# Patient Record
Sex: Male | Born: 1960 | Race: Black or African American | Hispanic: No | Marital: Married | Smoking: Current every day smoker
Health system: Southern US, Community
[De-identification: ages and names within clinical notes are randomized; demographics above are authoritative.]

## PROBLEM LIST (undated history)

## (undated) DIAGNOSIS — C61 Malignant neoplasm of prostate: Secondary | ICD-10-CM

## (undated) DIAGNOSIS — I10 Essential (primary) hypertension: Secondary | ICD-10-CM

## (undated) DIAGNOSIS — M549 Dorsalgia, unspecified: Secondary | ICD-10-CM

## (undated) HISTORY — DX: Malignant neoplasm of prostate: C61

---

## 2019-12-05 ENCOUNTER — Other Ambulatory Visit: Payer: Self-pay

## 2019-12-05 ENCOUNTER — Ambulatory Visit: Payer: Medicaid Other | Admitting: Family Medicine

## 2019-12-05 ENCOUNTER — Other Ambulatory Visit (HOSPITAL_COMMUNITY)
Admission: RE | Admit: 2019-12-05 | Discharge: 2019-12-05 | Disposition: A | Discharge status: Home / Self Care | Payer: Medicaid Other | Source: Ambulatory Visit | Attending: Family Medicine | Admitting: Family Medicine

## 2019-12-05 VITALS — BP 126/78 | HR 102 | Ht 66.93 in | Wt 138.6 lb

## 2019-12-05 DIAGNOSIS — Z111 Encounter for screening for respiratory tuberculosis: Secondary | ICD-10-CM | POA: Insufficient documentation

## 2019-12-05 DIAGNOSIS — Z0289 Encounter for other administrative examinations: Secondary | ICD-10-CM | POA: Diagnosis present

## 2019-12-05 DIAGNOSIS — Z720 Tobacco use: Secondary | ICD-10-CM

## 2019-12-05 DIAGNOSIS — H6120 Impacted cerumen, unspecified ear: Secondary | ICD-10-CM | POA: Insufficient documentation

## 2019-12-05 DIAGNOSIS — H6122 Impacted cerumen, left ear: Secondary | ICD-10-CM | POA: Diagnosis not present

## 2019-12-05 DIAGNOSIS — H6121 Impacted cerumen, right ear: Secondary | ICD-10-CM | POA: Insufficient documentation

## 2019-12-05 LAB — POCT URINALYSIS DIP (MANUAL ENTRY)
Bilirubin, UA: NEGATIVE
Glucose, UA: NEGATIVE mg/dL
Ketones, POC UA: NEGATIVE mg/dL
Leukocytes, UA: NEGATIVE
Nitrite, UA: NEGATIVE
Protein Ur, POC: NEGATIVE mg/dL
Spec Grav, UA: 1.015 (ref 1.010–1.025)
Urobilinogen, UA: 0.2 E.U./dL
pH, UA: 5.5 (ref 5.0–8.0)

## 2019-12-05 LAB — POCT UA - MICROSCOPIC ONLY

## 2019-12-05 NOTE — Patient Instructions (Signed)
Thank you for coming to see me today. It was a pleasure.   You will need to go for an chest xray.  We will call you case manager and schedule this for you.  Please follow-up with me on Oct 22 at 1125 am  We will discuss your labs at your next visit.    If you have any questions or concerns, please do not hesitate to call the office at [phone].  Best,   Dana Allan, MD Family Medicine Residency

## 2019-12-05 NOTE — Assessment & Plan Note (Signed)
Removal of impacted cerumen.  Patient aware that my have some irritation. Follow up in 4 weeks

## 2019-12-05 NOTE — Assessment & Plan Note (Signed)
Overseas Refugee TB screening positive.  Labs for quant ordered.  Will need chest xray.  Ordered and map provided. Will need to call case manager to arrange escort.

## 2019-12-05 NOTE — Addendum Note (Signed)
Addended by: Manson Passey, Demetre Monaco on: 12/05/2019 06:56 PM   Modules accepted: SmartSet

## 2019-12-05 NOTE — Progress Notes (Addendum)
Patient Name: Arthur Ali Date of Birth: 1961-01-02 Date of Visit: 12/05/19 PCP: No primary care provider on file.  Chief Complaint: refugee intake examination and right arm numbbness  The patient's preferred language is Swahili. An interpreter was used for the entire visit.  Interpreter Name or ID: Jocelyn    Subjective: Arthur Ali is a pleasant 59 y.o. presenting today for an initial refugee and immigrant clinic visit.    Right arm numbness Patient reports intermittent right arm numbness that starts at the shoulder and extends to tips of fingers.  He also reports burning sensation in fingers of same hand.  Has been ongoing since he was in [place].  Episodes last 1-2 secs and only happens during the day.  No aggravating or relieving factors.  Denies any headaches, slurred speech, facial drooping or weakness.  Reports no fevers or recent trauma.  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  ROS: per HPI  PMH: Unremarkable  PSH: None  FH: None  Current Medications:  None  Refugee Information Number of Immediate Family Members: 0 Number of Immediate Family Members in [place]: 0 Country of Birth: [facility] Country of Origin: [facility] Location of Refugee Camp: [place] Duration in [place]: 20 years or greater Reason for Leaving Home Country: Political opinion Primary Language: Swahili/Kiswahili, Kinyarwanda/Rwanda Able to Read in Primary Language: Yes Able to Write in Primary Language: Yes Education: Primary School Prior Work: No previous jobs Marital Status: Other (Wife and children decreased) Sexual Activity: No Tuberculosis Screening Overseas: Positive Tuberculosis Screening Health Department: Not Completed Health Department Labs Completed: Yes History of Trauma: None Do You Feel Jumpy or Nervous?: No Are You Very Watchful or 'Super Alert'?: No   Date of Overseas Exam: 11/11/19 Review of Overseas Exam: 12/05/19 Pre-Departure Treatment: None Overseas Vaccines  Reviewed and Updated in Epic: see immunizations tab   Vitals:   12/05/19 1433  BP: 126/78  Pulse: (!) 102  SpO2: 96%   HEENT: Sclera anicteric. Dentition is poor . Appears well hydrated. Left TM not visible, impacted dcerumen Neck: Supple Cardiac: Regular rate and rhythm. Normal S1/S2. No murmurs, rubs, or gallops appreciated.  Lungs: Clear bilaterally to ascultation.  Abdomen: Normoactive bowel sounds. No tenderness to deep or light palpation. No rebound or guarding. Splenomegaly not appreciated.  Extremities: Warm, well perfused without edema.  Skin: No rashes present  Psych: Pleasant and appropriate, No SI/HI MSK: ROM wnl, 5/5 strength in upper and lower extremities.  Tinels and Phalens negative. Neuro: CN II-XIII intact, motor, sensory and gait wnl  TB positive screening Overseas Refugee TB screening positive.  Labs for quant ordered.  Will need chest xray.  Ordered and map provided. Will need to call case manager to arrange escort.  Cerumen impaction of left ear No concerns with hearing.  Removal of impacted cerumen.  Patient aware that my have some irritation. Follow up in 4 weeks  Paresthesia Right arm Intermittent episodes lasting 1-2 seconds.  Considered TIA/CVA but less likely given no focal deficits on neuro exam and has been ongoing since living in [place]. Has no increasing risk factors.  Low suspicion for carpal tunnel given negative exam.  Likely paresthesia to cervical degenerative secondary to aging.  Will continue to monitor and if worsens can consider MRI.  Designated Market researcher signed with agency: yes.   Release of information signed for Health Department : yes.   Return to care in 1 month in [facility] with resident physician and PCP: Scheduled for Oct 22.   Vaccines: None  today   Follow up in 4 weeks with PCP.  I discussed the plan of care with the resident physician and agree with below documentation.  Terisa Starr, MD

## 2019-12-07 ENCOUNTER — Telehealth: Payer: Self-pay | Admitting: Family Medicine

## 2019-12-07 LAB — URINE CYTOLOGY ANCILLARY ONLY
Chlamydia: NEGATIVE
Comment: NEGATIVE
Comment: NORMAL
Neisseria Gonorrhea: NEGATIVE

## 2019-12-07 NOTE — Telephone Encounter (Addendum)
The patient speaks Swahili as their primary language.  An interpreter was used for the entire visit.   Left generic voicemail.  Labs notable for leukocytosis (neutrophilia and mild eosinophilia), thrombocytopenia, elevated total protein.   Patient with positive RPR, negative T. Pallidum--likely false positive related to vaccine.   Will follow up ASAP (Monday during precepting or Tuesday AM).  Will try patient later today. If not, will contact CM to schedule for visit.   Addendum: attempted to call patient on 1730 on 10/1, again went to voicemail---will reach out to CM.   Terisa Starr, MD  Family Medicine Teaching Service

## 2019-12-08 ENCOUNTER — Other Ambulatory Visit: Payer: Self-pay | Admitting: Obstetrics and Gynecology

## 2019-12-08 ENCOUNTER — Ambulatory Visit
Admission: RE | Admit: 2019-12-08 | Discharge: 2019-12-08 | Disposition: A | Discharge status: Home / Self Care | Payer: No Typology Code available for payment source | Source: Ambulatory Visit | Attending: Obstetrics and Gynecology | Admitting: Obstetrics and Gynecology

## 2019-12-08 DIAGNOSIS — R9389 Abnormal findings on diagnostic imaging of other specified body structures: Secondary | ICD-10-CM

## 2019-12-09 LAB — COMPREHENSIVE METABOLIC PANEL
ALT: 19 IU/L (ref 0–44)
AST: 23 IU/L (ref 0–40)
Albumin/Globulin Ratio: 0.5 — ABNORMAL LOW (ref 1.2–2.2)
Albumin: 3.5 g/dL — ABNORMAL LOW (ref 3.8–4.8)
Alkaline Phosphatase: 53 IU/L (ref 44–121)
BUN/Creatinine Ratio: 14 (ref 10–24)
BUN: 11 mg/dL (ref 8–27)
Bilirubin Total: 0.5 mg/dL (ref 0.0–1.2)
CO2: 27 mmol/L (ref 20–29)
Calcium: 9.2 mg/dL (ref 8.6–10.2)
Chloride: 98 mmol/L (ref 96–106)
Creatinine, Ser: 0.81 mg/dL (ref 0.76–1.27)
GFR calc Af Amer: 111 mL/min/{1.73_m2} (ref >59)
GFR calc non Af Amer: 96 mL/min/{1.73_m2} (ref >59)
Globulin, Total: 6.5 g/dL — ABNORMAL HIGH (ref 1.5–4.5)
Glucose: 100 mg/dL — ABNORMAL HIGH (ref 65–99)
Potassium: 4.7 mmol/L (ref 3.5–5.2)
Sodium: 133 mmol/L — ABNORMAL LOW (ref 134–144)
Total Protein: 10 g/dL — ABNORMAL HIGH (ref 6.0–8.5)

## 2019-12-09 LAB — CBC WITH DIFFERENTIAL/PLATELET
Basophils Absolute: 0 10*3/uL (ref 0.0–0.2)
Basos: 0 %
EOS (ABSOLUTE): 0.5 10*3/uL — ABNORMAL HIGH (ref 0.0–0.4)
Eos: 4 %
Hematocrit: 41 % (ref 37.5–51.0)
Hemoglobin: 13.8 g/dL (ref 13.0–17.7)
Immature Grans (Abs): 0 10*3/uL (ref 0.0–0.1)
Immature Granulocytes: 0 %
Lymphocytes Absolute: 9.2 10*3/uL — ABNORMAL HIGH (ref 0.7–3.1)
Lymphs: 68 %
MCH: 29.7 pg (ref 26.6–33.0)
MCHC: 33.7 g/dL (ref 31.5–35.7)
MCV: 88 fL (ref 79–97)
Monocytes Absolute: 0.7 10*3/uL (ref 0.1–0.9)
Monocytes: 5 %
Neutrophils Absolute: 3.2 10*3/uL (ref 1.4–7.0)
Neutrophils: 23 %
Platelets: 58 10*3/uL — CL (ref 150–450)
RBC: 4.64 x10E6/uL (ref 4.14–5.80)
RDW: 13.5 % (ref 11.6–15.4)
WBC: 13.7 10*3/uL — ABNORMAL HIGH (ref 3.4–10.8)

## 2019-12-09 LAB — RPR: RPR Ser Ql: REACTIVE — AB

## 2019-12-09 LAB — LIPID PANEL
Chol/HDL Ratio: 2.8 ratio (ref 0.0–5.0)
Cholesterol, Total: 118 mg/dL (ref 100–199)
HDL: 42 mg/dL (ref >39)
LDL Chol Calc (NIH): 63 mg/dL (ref 0–99)
Triglycerides: 60 mg/dL (ref 0–149)
VLDL Cholesterol Cal: 13 mg/dL (ref 5–40)

## 2019-12-09 LAB — QUANTIFERON-TB GOLD PLUS
QuantiFERON Mitogen Value: >10 IU/mL
QuantiFERON Nil Value: 0.55 IU/mL
QuantiFERON TB1 Ag Value: 0.46 IU/mL
QuantiFERON TB2 Ag Value: 0.45 IU/mL
QuantiFERON-TB Gold Plus: NEGATIVE

## 2019-12-09 LAB — HIV ANTIBODY (ROUTINE TESTING W REFLEX): HIV Screen 4th Generation wRfx: NONREACTIVE

## 2019-12-09 LAB — VARICELLA ZOSTER ANTIBODY, IGG: Varicella zoster IgG: 416 index (ref >165)

## 2019-12-09 LAB — RPR, QUANT+TP ABS (REFLEX)
Rapid Plasma Reagin, Quant: 1:4 {titer} — ABNORMAL HIGH
T Pallidum Abs: NONREACTIVE

## 2019-12-09 LAB — TSH: TSH: 2.59 u[IU]/mL (ref 0.450–4.500)

## 2019-12-09 LAB — STRONGYLOIDES, AB, IGG: Strongyloides, Ab, IgG: NEGATIVE

## 2019-12-09 LAB — HCV AB W REFLEX TO QUANT PCR: HCV Ab: 0.2 s/co ratio (ref 0.0–0.9)

## 2019-12-09 LAB — HEPATITIS B CORE ANTIBODY, TOTAL: Hep B Core Total Ab: NEGATIVE

## 2019-12-09 LAB — HCV INTERPRETATION

## 2019-12-09 LAB — HEPATITIS B SURFACE ANTIGEN: Hepatitis B Surface Ag: NEGATIVE

## 2019-12-09 LAB — HEPATITIS B SURFACE ANTIBODY, QUANTITATIVE: Hepatitis B Surf Ab Quant: 61.9 m[IU]/mL (ref >9.9)

## 2019-12-11 ENCOUNTER — Telehealth: Payer: Self-pay | Admitting: Family Medicine

## 2019-12-11 NOTE — Telephone Encounter (Signed)
Attempted to call patient regarding elevated serum protein, low platelets. Sent secure message to CM to help schedule patient for lab visit ASAP.   Terisa Starr, MD  Family Medicine Teaching Service

## 2019-12-12 ENCOUNTER — Telehealth: Payer: Self-pay | Admitting: Family Medicine

## 2019-12-12 ENCOUNTER — Other Ambulatory Visit: Payer: Self-pay | Admitting: Family Medicine

## 2019-12-12 DIAGNOSIS — D696 Thrombocytopenia, unspecified: Secondary | ICD-10-CM

## 2019-12-12 NOTE — Progress Notes (Signed)
Spoke with patient's CM with permission. Scheduled for lab visit in AM. Labs ordered to evaluate thrombocytopenia, elevated serum protein, concern for hypersplenism.   Arthur Starr, MD  Family Medicine Teaching Service

## 2019-12-12 NOTE — Telephone Encounter (Signed)
Attempted to call patient with Swahili interpreter. Again, unable to leave VM. Contacted emergency contact given degree of thrombocytopenia. Unable to contact patient. Will reach out to CM again.   Terisa Starr, MD  Family Medicine Teaching Service

## 2019-12-13 ENCOUNTER — Other Ambulatory Visit: Payer: No Typology Code available for payment source

## 2019-12-13 ENCOUNTER — Other Ambulatory Visit: Payer: Self-pay

## 2019-12-13 ENCOUNTER — Other Ambulatory Visit: Payer: Self-pay | Admitting: Family Medicine

## 2019-12-13 DIAGNOSIS — D696 Thrombocytopenia, unspecified: Secondary | ICD-10-CM

## 2019-12-14 LAB — PARASITE EXAM, BLOOD

## 2019-12-14 LAB — CBC WITH DIFFERENTIAL/PLATELET
Basophils Absolute: 0.1 10*3/uL (ref 0.0–0.2)
Basos: 1 %
EOS (ABSOLUTE): 0.5 10*3/uL — ABNORMAL HIGH (ref 0.0–0.4)
Eos: 4 %
Hematocrit: 42.6 % (ref 37.5–51.0)
Hemoglobin: 13.8 g/dL (ref 13.0–17.7)
Immature Grans (Abs): 0 10*3/uL (ref 0.0–0.1)
Immature Granulocytes: 0 %
Lymphocytes Absolute: 8.8 10*3/uL — ABNORMAL HIGH (ref 0.7–3.1)
Lymphs: 70 %
MCH: 29.4 pg (ref 26.6–33.0)
MCHC: 32.4 g/dL (ref 31.5–35.7)
MCV: 91 fL (ref 79–97)
Monocytes Absolute: 0.6 10*3/uL (ref 0.1–0.9)
Monocytes: 5 %
Neutrophils Absolute: 2.4 10*3/uL (ref 1.4–7.0)
Neutrophils: 20 %
RBC: 4.69 x10E6/uL (ref 4.14–5.80)
RDW: 13.2 % (ref 11.6–15.4)
WBC: 12.3 10*3/uL — ABNORMAL HIGH (ref 3.4–10.8)

## 2019-12-14 LAB — HEPATIC FUNCTION PANEL
ALT: 18 IU/L (ref 0–44)
AST: 23 IU/L (ref 0–40)
Albumin: 3.6 g/dL — ABNORMAL LOW (ref 3.8–4.8)
Alkaline Phosphatase: 55 IU/L (ref 44–121)
Bilirubin Total: 0.4 mg/dL (ref 0.0–1.2)
Bilirubin, Direct: 0.11 mg/dL (ref 0.00–0.40)
Total Protein: 9.6 g/dL — ABNORMAL HIGH (ref 6.0–8.5)

## 2019-12-14 LAB — VITAMIN B12: Vitamin B-12: 280 pg/mL (ref 232–1245)

## 2019-12-14 LAB — GLUCOSE 6 PHOSPHATE DEHYDROGENASE

## 2019-12-16 ENCOUNTER — Telehealth: Payer: Self-pay | Admitting: Family Medicine

## 2019-12-16 LAB — PATHOLOGIST SMEAR REVIEW
Basophils Absolute: 0.1 10*3/uL (ref 0.0–0.2)
Basos: 0 %
EOS (ABSOLUTE): 0.4 10*3/uL (ref 0.0–0.4)
Eos: 4 %
Hematocrit: 42.5 % (ref 37.5–51.0)
Hemoglobin: 13.9 g/dL (ref 13.0–17.7)
Immature Grans (Abs): 0 10*3/uL (ref 0.0–0.1)
Immature Granulocytes: 0 %
Lymphocytes Absolute: 8.3 10*3/uL — ABNORMAL HIGH (ref 0.7–3.1)
Lymphs: 70 %
MCH: 29.3 pg (ref 26.6–33.0)
MCHC: 32.7 g/dL (ref 31.5–35.7)
MCV: 90 fL (ref 79–97)
Monocytes Absolute: 0.6 10*3/uL (ref 0.1–0.9)
Monocytes: 5 %
Neutrophils Absolute: 2.4 10*3/uL (ref 1.4–7.0)
Neutrophils: 21 %
RBC: 4.75 x10E6/uL (ref 4.14–5.80)
RDW: 12.9 % (ref 11.6–15.4)
WBC: 11.8 10*3/uL — ABNORMAL HIGH (ref 3.4–10.8)

## 2019-12-16 NOTE — Telephone Encounter (Signed)
Attempted to call X2 with Swahili interpreter.  also attempted to call roommate.   unable to reach the patient after multiple attempts.  Called hematology oncology on call.  Discussed case with Dr. Candise Che.  Patient has findings suggestive of possible CLL and/or viral infection.  Hepatitis B C and HIV are negative so far.  He does have a reactive RPR but this appears to be a false positive.  Discussed above.  He recommends an abdominal ultrasound to evaluate for hepatosplenomegaly.  Can also obtain flow cytometry at follow-up visit.  Recommend referral to hematology oncology.  Securely messaged caseworker to see if she can bring patient to visit which is scheduled on Tuesday with Dr. Leary Roca.  Terisa Starr, MD  Family Medicine Teaching Service

## 2019-12-19 ENCOUNTER — Ambulatory Visit: Payer: No Typology Code available for payment source

## 2019-12-19 DIAGNOSIS — D696 Thrombocytopenia, unspecified: Secondary | ICD-10-CM

## 2019-12-19 DIAGNOSIS — D72820 Lymphocytosis (symptomatic): Secondary | ICD-10-CM

## 2019-12-21 ENCOUNTER — Other Ambulatory Visit: Payer: Self-pay

## 2019-12-21 ENCOUNTER — Ambulatory Visit (INDEPENDENT_AMBULATORY_CARE_PROVIDER_SITE_OTHER): Payer: Medicaid Other | Admitting: Family Medicine

## 2019-12-21 VITALS — BP 110/56 | HR 93 | Wt 143.4 lb

## 2019-12-21 DIAGNOSIS — D696 Thrombocytopenia, unspecified: Secondary | ICD-10-CM | POA: Diagnosis not present

## 2019-12-21 DIAGNOSIS — D72820 Lymphocytosis (symptomatic): Secondary | ICD-10-CM | POA: Insufficient documentation

## 2019-12-21 NOTE — Patient Instructions (Addendum)
Thank you for coming to see me today. It was a pleasure. Today we talked about:   You have been referred to a blood specialist and his office should be in contact with your social worker about the appointment.  We will get some labs today and your specialist will likely use them to help them decide what is causing your infection cells to be elevated.  Please follow up with Dr. Clent Ridges on 10/22 as scheduled.  If you have any questions or concerns, please do not hesitate to call the office at [phone].  Best,   Luis Abed, DO

## 2019-12-21 NOTE — Progress Notes (Signed)
    SUBJECTIVE:   CHIEF COMPLAINT / HPI:   Discuss Results Patient has had leukocytosis with lymphocytic predominance and thrombocytopenia on labs since September States that now he is feeling well He reports that he had fever after COVID vaccination, but it has gone away and has otherwise been feeling well He doesn't weight himself but hasn't noticed any large changes in his weight, if anything clothes are tighter He states that at night he feels very hot, but thinks it is because of the sun coming in his window  He declines giving a phone number for himself as he doesn't know how to use his phone  He would like for his case manager to be the point of contact and he gives permission for her to be involved in his medical care and share his medical information with her. Case Manager, Hollice Espy [phone], email [email or web address] .Armen Pickup interpreter used for entirety of encounter, patient's case manager Doha was also present for entirety of encounter  PERTINENT  PMH / PSH: Recent refugee status  OBJECTIVE:   BP (!) 110/56   Pulse 93   Wt 143 lb 6.4 oz (65 kg)   SpO2 97%   BMI 22.51 kg/m    Physical Exam:  General: 59 y.o. male in NAD Lungs: Breathing comfortably on room air Skin: warm and dry Extremities: No edema, ambulating without difficulty   ASSESSMENT/PLAN:   Lymphocytosis Discussed with patient that he has had an elevation in his infection fighting cells and that this needs to be investigated further by a specialist, who would be a hematologist.  Dr. Manson Passey has previously spoken with Dr. Candise Che about this patient.  We will go ahead and refer to hematology so that he can be seen.  Will obtain a platelet count today but will leave other more specific testing for hematology to ensure that it is appropriately ordered.  He is already scheduled for a follow-up appointment on 10/22 with his PCP, advised him to keep this appointment to ensure that this is followed up on.  And  referral order, used his case manager's phone number per his request as he would like for her to be his point of contact.     Unknown Jim, DO [facility]

## 2019-12-21 NOTE — Assessment & Plan Note (Signed)
Discussed with patient that he has had an elevation in his infection fighting cells and that this needs to be investigated further by a specialist, who would be a hematologist.  Dr. Manson Passey has previously spoken with Dr. Candise Che about this patient.  We will go ahead and refer to hematology so that he can be seen.  Will obtain a platelet count today but will leave other more specific testing for hematology to ensure that it is appropriately ordered.  He is already scheduled for a follow-up appointment on 10/22 with his PCP, advised him to keep this appointment to ensure that this is followed up on.  And referral order, used his case manager's phone number per his request as he would like for her to be his point of contact.

## 2019-12-22 LAB — PLATELET COUNT: Platelets: 64 10*3/uL — CL (ref 150–450)

## 2019-12-25 ENCOUNTER — Telehealth: Payer: Self-pay | Admitting: Hematology

## 2019-12-25 NOTE — Telephone Encounter (Signed)
Scheduled appointment per 10/14 new patient referral. Spoke to [facility], patient's case manager, who handles all of the patient's appointments. She is aware of appointment date and time.

## 2019-12-26 ENCOUNTER — Other Ambulatory Visit: Payer: Self-pay

## 2019-12-26 ENCOUNTER — Inpatient Hospital Stay: Payer: Medicaid Other | Attending: Hematology | Admitting: Hematology

## 2019-12-26 ENCOUNTER — Inpatient Hospital Stay: Payer: Medicaid Other

## 2019-12-26 VITALS — BP 134/89 | HR 86 | Temp 98.5°F | Resp 18 | Ht 66.93 in | Wt 141.9 lb

## 2019-12-26 DIAGNOSIS — F17210 Nicotine dependence, cigarettes, uncomplicated: Secondary | ICD-10-CM | POA: Insufficient documentation

## 2019-12-26 DIAGNOSIS — D72829 Elevated white blood cell count, unspecified: Secondary | ICD-10-CM | POA: Diagnosis not present

## 2019-12-26 DIAGNOSIS — D696 Thrombocytopenia, unspecified: Secondary | ICD-10-CM | POA: Insufficient documentation

## 2019-12-26 DIAGNOSIS — D72820 Lymphocytosis (symptomatic): Secondary | ICD-10-CM | POA: Diagnosis not present

## 2019-12-26 DIAGNOSIS — R232 Flushing: Secondary | ICD-10-CM | POA: Insufficient documentation

## 2019-12-26 DIAGNOSIS — Z79899 Other long term (current) drug therapy: Secondary | ICD-10-CM | POA: Diagnosis not present

## 2019-12-26 DIAGNOSIS — M79601 Pain in right arm: Secondary | ICD-10-CM | POA: Insufficient documentation

## 2019-12-26 LAB — CBC WITH DIFFERENTIAL/PLATELET
Abs Immature Granulocytes: 0.01 10*3/uL (ref 0.00–0.07)
Basophils Absolute: 0 10*3/uL (ref 0.0–0.1)
Basophils Relative: 0 %
Eosinophils Absolute: 0.3 10*3/uL (ref 0.0–0.5)
Eosinophils Relative: 3 %
HCT: 41.2 % (ref 39.0–52.0)
Hemoglobin: 13.5 g/dL (ref 13.0–17.0)
Immature Granulocytes: 0 %
Lymphocytes Relative: 64 %
Lymphs Abs: 5.2 10*3/uL — ABNORMAL HIGH (ref 0.7–4.0)
MCH: 28.9 pg (ref 26.0–34.0)
MCHC: 32.8 g/dL (ref 30.0–36.0)
MCV: 88.2 fL (ref 80.0–100.0)
Monocytes Absolute: 0.5 10*3/uL (ref 0.1–1.0)
Monocytes Relative: 6 %
Neutro Abs: 2.2 10*3/uL (ref 1.7–7.7)
Neutrophils Relative %: 27 %
Platelets: 195 10*3/uL (ref 150–400)
RBC: 4.67 MIL/uL (ref 4.22–5.81)
RDW: 12.6 % (ref 11.5–15.5)
WBC: 8.3 10*3/uL (ref 4.0–10.5)
nRBC: 0 % (ref 0.0–0.2)

## 2019-12-26 LAB — IMMATURE PLATELET FRACTION: Immature Platelet Fraction: 1.8 % (ref 1.2–8.6)

## 2019-12-26 LAB — CMP (CANCER CENTER ONLY)
ALT: 15 U/L (ref 0–44)
AST: 23 U/L (ref 15–41)
Albumin: 2.9 g/dL — ABNORMAL LOW (ref 3.5–5.0)
Alkaline Phosphatase: 49 U/L (ref 38–126)
Anion gap: 3 — ABNORMAL LOW (ref 5–15)
BUN: 15 mg/dL (ref 8–23)
CO2: 31 mmol/L (ref 22–32)
Calcium: 9.4 mg/dL (ref 8.9–10.3)
Chloride: 103 mmol/L (ref 98–111)
Creatinine: 0.81 mg/dL (ref 0.61–1.24)
GFR, Estimated: >60 mL/min (ref >60)
Glucose, Bld: 95 mg/dL (ref 70–99)
Potassium: 4.1 mmol/L (ref 3.5–5.1)
Sodium: 137 mmol/L (ref 135–145)
Total Bilirubin: 0.4 mg/dL (ref 0.3–1.2)
Total Protein: 9.5 g/dL — ABNORMAL HIGH (ref 6.5–8.1)

## 2019-12-26 LAB — PLATELET BY CITRATE

## 2019-12-26 LAB — IRON AND TIBC
Iron: 46 ug/dL (ref 42–163)
Saturation Ratios: 15 % — ABNORMAL LOW (ref 20–55)
TIBC: 310 ug/dL (ref 202–409)
UIBC: 264 ug/dL (ref 117–376)

## 2019-12-26 LAB — LACTATE DEHYDROGENASE: LDH: 180 U/L (ref 98–192)

## 2019-12-26 LAB — VITAMIN B12: Vitamin B-12: 162 pg/mL — ABNORMAL LOW (ref 180–914)

## 2019-12-26 NOTE — Progress Notes (Signed)
HEMATOLOGY/ONCOLOGY CONSULTATION NOTE  Date of Service: 12/26/2019  Patient Care Team: Dana Allan, MD as PCP - General (Family Medicine)  CHIEF COMPLAINTS/PURPOSE OF CONSULTATION:  Lymphocytosis/Thrombocytopenia  HISTORY OF PRESENTING ILLNESS:  Arthur Ali is a wonderful 59 y.o. male who has been referred to [place] by Dr. Terisa Starr for evaluation and management of lymphocytosis/thrombocytopenia. Pt is accompanied today by a Swahili interpreter. The pt reports that she is doing well overall.   The pt reports that he is from the [place] and recently moved here as a refugee. Pt currently lives alone. 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 He has not found employment here yet.  Pt denies any previous medical issues or surgeries. He is not on any medications chronically. Pt denies any family history of cancers or blood disorders.   Pt has had Malaria many times. His worst Malaria infection was in July/August of this year. He was treated for this in a camp in [place]. Pt denies any symptoms that suggest a current Malaria infection. He denies any previous TB infections. Pt has been experiencing right arm pain for nearly five months. This is accompanied burning sensation in his fingers. He also endorses occasional hot flashes. This has been happening for about 10 years and passes quickly. Pt maintains that he feels well overall.   Most recent lab results (12/13/2019) of CBC w/diff & Hepatic Panel is as follows: all values are WNL except for WBC at 12.3K, Lymphs Abs at 8.8K, Eos Abs at 0.5K, Total Protein at 9.6, Albumin at 3.6. - PLT not read due to aggregation 12/21/2019 PLT at 64K.  On review of systems, pt reports hot flashes, right arm pain and denies fevers, hyperhidrosis, abdominal pain, neck pain, constipation, leg swelling, rash, back pain, testicular pain/swelling and any other symptoms.   On PMHx the pt reports multiple Malaria infections. On  Social Hx the pt reports that he is currently smoking about 5 cigarettes per day. He was smoking 1/3 ppd at his heaviest. Pt has never been a heavy drinker.  MEDICAL HISTORY:  No past medical history on file.  SURGICAL HISTORY: No past surgical history on file.  SOCIAL HISTORY: Social History   Socioeconomic History   Marital status: Married    Spouse name: Not on file   Number of children: Not on file   Years of education: Not on file   Highest education level: Not on file  Occupational History   Not on file  Tobacco Use   Smoking status: Current Every Day Smoker    Types: Cigars   Smokeless tobacco: Never Used  Substance and Sexual Activity   Alcohol use: Never   Drug use: Never   Sexual activity: Not Currently  Other Topics Concern   Not on file  Social History Narrative   Single applicant from [place]   No family here or in [place]      Refugee Information   Number of Immediate Family Members: 0   Number of Immediate Family Members in [place]: 0   Country of Birth: [facility]   Country of Origin: [facility]   Location of Refugee Camp: [place]   Duration in [place]: 20 years or greater   Reason for Leaving Home Country: Political opinion   Primary Language: Swahili/Kiswahili, Kinyarwanda/Rwanda   Able to Read in Primary Language: Yes   Able to Write in Primary Language: Yes   Education: Primary School   Prior Work: No previous jobs  Marital Status: Other (Wife and children decreased)   Sexual Activity: No   Tuberculosis Screening Overseas: Positive   Tuberculosis Screening Health Department: Not Completed   Health Department Labs Completed: Yes   History of Trauma: None   Do You Feel Jumpy or Nervous?: No   Are You Very Watchful or 'Super Alert'?: No   Social Determinants of Health   Financial Resource Strain:    Difficulty of Paying Living Expenses: Not on file  Food Insecurity:    Worried About Running Out of Food in the Last Year: Not on file   [employer] of Food in  the Last Year: Not on file  Transportation Needs:    Lack of Transportation (Medical): Not on file   Lack of Transportation (Non-Medical): Not on file  Physical Activity:    Days of Exercise per Week: Not on file   Minutes of Exercise per Session: Not on file  Stress:    Feeling of Stress : Not on file  Social Connections:    Frequency of Communication with Friends and Family: Not on file   Frequency of Social Gatherings with Friends and Family: Not on file   Attends Religious Services: Not on file   Active Member of Clubs or Organizations: Not on file   Attends Banker Meetings: Not on file   Marital Status: Not on file  Intimate Partner Violence:    Fear of Current or Ex-Partner: Not on file   Emotionally Abused: Not on file   Physically Abused: Not on file   Sexually Abused: Not on file    FAMILY HISTORY: No family history on file.  ALLERGIES:  has no allergies on file.  MEDICATIONS:  No current outpatient medications on file.   No current facility-administered medications for this visit.    REVIEW OF SYSTEMS:    10 Point review of Systems was done is negative except as noted above.  PHYSICAL EXAMINATION: ECOG PERFORMANCE STATUS: 0 - Asymptomatic  . Vitals:   12/26/19 1147  BP: 134/89  Pulse: 86  Resp: 18  Temp: 98.5 F (36.9 C)  SpO2: 99%   Filed Weights   12/26/19 1147  Weight: 141 lb 14.4 oz (64.4 kg)   .Body mass index is 22.27 kg/m.  GENERAL:alert, in no acute distress and comfortable SKIN: no acute rashes, no significant lesions EYES: conjunctiva are pink and non-injected, sclera anicteric OROPHARYNX: MMM, no exudates, no oropharyngeal erythema or ulceration NECK: supple, no JVD LYMPH:  no palpable lymphadenopathy in the cervical, axillary or inguinal regions LUNGS: clear to auscultation b/l with normal respiratory effort HEART: regular rate & rhythm ABDOMEN:  normoactive bowel sounds , non tender, not  distended. Extremity: no pedal edema PSYCH: alert & oriented x 3 with fluent speech NEURO: no focal motor/sensory deficits  LABORATORY DATA:  I have reviewed the data as listed  . CBC Latest Ref Rng & Units 12/26/2019 12/21/2019 12/13/2019  WBC 4.0 - 10.5 K/uL 8.3 - 12.3(H)  Hemoglobin 13.0 - 17.0 g/dL 85.2 - 77.8  Hematocrit 39 - 52 % 41.2 - 42.6  Platelets 150 - 400 K/uL 195 64(LL) CANCELED    . CMP Latest Ref Rng & Units 12/26/2019 12/13/2019 12/05/2019  Glucose 70 - 99 mg/dL 95 - 242(P)  BUN 8 - 23 mg/dL 15 - 11  Creatinine 5.36 - 1.24 mg/dL 1.44 - 3.15  Sodium 400 - 145 mmol/L 137 - 133(L)  Potassium 3.5 - 5.1 mmol/L 4.1 - 4.7  Chloride 98 - 111  mmol/L 103 - 98  CO2 22 - 32 mmol/L 31 - 27  Calcium 8.9 - 10.3 mg/dL 9.4 - 9.2  Total Protein 6.5 - 8.1 g/dL 1.6(X9.5(H) 0.9(U9.6(H) 10.0(H)  Total Bilirubin 0.3 - 1.2 mg/dL 0.4 0.4 0.5  Alkaline Phos 38 - 126 U/L 49 55 53  AST 15 - 41 U/L 23 23 23   ALT 0 - 44 U/L 15 18 19      RADIOGRAPHIC STUDIES: I have personally reviewed the radiological images as listed and agreed with the findings in the report. DG Chest 1 View  Result Date: 12/10/2019 CLINICAL DATA:  History of abnormal chest radiograph. History of smoking. EXAM: CHEST  1 VIEW COMPARISON:  None. FINDINGS: Normal heart size. Normal mediastinal contour. No pneumothorax. No pleural effusion. Lungs appear clear, with no acute consolidative airspace disease and no pulmonary edema. IMPRESSION: No active disease. Electronically Signed   By: Delbert PhenixJason A Poff M.D.   On: 12/10/2019 09:03    ASSESSMENT & PLAN:   59 [facility]  PLAN: -Discussed patient's most recent labs from 12/13/2019, all values are WNL except for WBC at 12.3K, Lymphs Abs at 8.8K, Eos Abs at 0.5K, Total Protein at 9.6, Albumin at 3.6. - PLT not read due to aggregation. -Discussed 12/21/2019 PLT at 64K. 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  -Advised pt that lymphocytes can be elevated in a reactive manner or due to a primary bone marrow disorder.  -Advised pt that infections and smoking can cause reactive elevation of lymphocytes. -Recommend pt f/u with PCP for evaluation of right arm discomfort - could be a pinched nerve.  -Recommend complete smoking cessation - f/u with PCP for assistance as needed. -Will get labs today - collect in citrate tube  -Will see back in 2 weeks with labs   FOLLOW UP: Labs today RTC with Dr Candise CheKale in 2 weeks (with Swahili interpreter)  . Orders Placed This Encounter  Procedures   CBC with Differential/Platelet    Standing Status:   Future    Number of Occurrences:   1    Standing Expiration Date:   12/25/2020   CMP (Cancer Center only)    Standing Status:   Future    Number of Occurrences:   1    Standing Expiration Date:   12/25/2020   Lactate dehydrogenase    Standing Status:   Future    Number of Occurrences:   1    Standing Expiration Date:   12/25/2020   Flow Cytometry    Standing Status:   Future    Number of Occurrences:   1    Standing Expiration Date:   12/25/2020   Vitamin B12    Standing Status:   Future    Number of Occurrences:   1    Standing Expiration Date:   12/25/2020   Folate RBC    Standing Status:   Future    Number of Occurrences:   1    Standing Expiration Date:   12/25/2020   Iron and TIBC    Standing Status:   Future    Number of Occurrences:   1    Standing Expiration Date:   12/25/2020   Immature Platelet Fraction    Standing Status:   Future    Number of Occurrences:   1    Standing Expiration Date:   12/25/2020   Platelet by Citrate    Standing Status:  Future    Number of Occurrences:   1    Standing Expiration Date:   12/25/2020   Flow Cytometry    Persistent lymphocytosis r/o lymphoproliferative disorder    Standing Status:   Future    Standing Expiration Date:   12/25/2020     All of the patients questions were  answered with apparent satisfaction. The patient knows to call the clinic with any problems, questions or concerns.  I spent 30 mins counseling the patient face to face. The total time spent in the appointment was 45 minutes and more than 50% was on counseling and direct patient cares.    Wyvonnia Lora MD MS AAHIVMS [facility] Hematology/Oncology Physician [facility]  (Office):       [phone] (Work cell):  [phone] (Fax):           [phone]  12/26/2019 2:02 PM  I, Carollee Herter, am acting as a scribe for Dr. Wyvonnia Lora.   .I have reviewed the above documentation for accuracy and completeness, and I agree with the above. Johney Maine MD

## 2019-12-26 NOTE — Patient Instructions (Signed)
Thank you for choosing [facility] to provide your oncology and hematology care.   Should you have questions after your visit to the [facility] ([facility]), please contact this office at [phone] between 8:30 AM and 4:30 PM.  Voice mails left after 4:00 PM may not be returned until the following business day.  Calls received after 4:30 PM will be answered by an off-site Nurse Triage Line.    Prescription Refills:  Please have your pharmacy contact us directly for most prescription requests.  Contact the office directly for refills of narcotics (pain medications). Allow 48-72 hours for refills.  Appointments: Please contact the [facility] scheduling department [phone] for questions regarding [facility] appointment scheduling.  Contact the schedulers with any scheduling changes so that your appointment can be rescheduled in a timely manner.   Central Scheduling for [place] [phone] - Call to schedule procedures such as PET scans, CT scans, MRI, Ultrasound, etc.  To afford each patient quality time with our providers, please arrive 30 minutes before your scheduled appointment time.  If you arrive late for your appointment, you may be asked to reschedule.  We strive to give you quality time with our providers, and arriving late affects you and other patients whose appointments are after yours. If you are a no show for multiple scheduled visits, you may be dismissed from the clinic at the providers discretion.     Resources: [facility] Social Workers [phone] for additional information on assistance programs or assistance connecting with community support programs   [place] DSS  [phone]: Information regarding food stamps, Medicaid, and utility assistance [place] Access [place] 336-333-6589   [place] [employer]'s shared-ride transportation service for eligible riders who have a disability that prevents them from riding the fixed route bus.   Medicare  Rights Center 800-333-4114 Helps people with Medicare understand their rights and benefits, navigate the Medicare system, and secure the quality healthcare they deserve [employer] 800-227-2345 Assists patients locate various types of support and financial assistance Cancer Care: 1-800-813-HOPE (4673) Provides financial assistance, online support groups, medication/co-pay assistance.   Transportation Assistance for appointments at [facility]: Transportation Coordinator [phone]  Again, thank you for choosing [facility] for your care.       

## 2019-12-27 LAB — FOLATE RBC
Folate, Hemolysate: 316 ng/mL
Folate, RBC: 707 ng/mL (ref >498)
Hematocrit: 44.7 % (ref 37.5–51.0)

## 2019-12-27 LAB — SURGICAL PATHOLOGY

## 2019-12-27 NOTE — Progress Notes (Signed)
    SUBJECTIVE:   CHIEF COMPLAINT / HPI: f/u from refugee clinic   No acute concerns. Reports numbness in right arm is resolving.  Denies any fevers, chills or decrease in appetite.    Lymphocytosis Seen by hematology 10/19.  Likely reactive.  Has a follow up appointment Nov 4th.  Has not has abd u/s  PERTINENT  PMH / PSH:   OBJECTIVE:   BP 116/78   Pulse 82   Ht 5' 6.93" (1.7 m)   Wt 142 lb (64.4 kg)   SpO2 98%   BMI 22.29 kg/m    General: Alert and oriented, no apparent distress  Cardiovascular: RRR with no murmurs noted Respiratory: CTA bilaterally  Gastrointestinal: Bowel sounds present. No abdominal pain.  No hepatosplenomegaly  ASSESSMENT/PLAN:   Encounter for health examination of refugee No current concerns Follow up in 4 weeks  Lymphocytosis Denies any fevers, chills, weight loss or decrease in appetite. Benign abdominal exam.   Abdominal u/s scheduled for 10/28 Continue to follow up with Hematology as scheduled -Follow up in 4 weeks      Dana Allan, MD [facility]

## 2019-12-28 LAB — FLOW CYTOMETRY

## 2019-12-29 ENCOUNTER — Encounter: Payer: Self-pay | Admitting: Family Medicine

## 2019-12-29 ENCOUNTER — Other Ambulatory Visit: Payer: Self-pay

## 2019-12-29 ENCOUNTER — Ambulatory Visit (INDEPENDENT_AMBULATORY_CARE_PROVIDER_SITE_OTHER): Payer: Medicaid Other | Admitting: Family Medicine

## 2019-12-29 VITALS — BP 116/78 | HR 82 | Ht 66.93 in | Wt 142.0 lb

## 2019-12-29 DIAGNOSIS — Z0289 Encounter for other administrative examinations: Secondary | ICD-10-CM

## 2019-12-29 DIAGNOSIS — D72820 Lymphocytosis (symptomatic): Secondary | ICD-10-CM | POA: Diagnosis not present

## 2019-12-29 NOTE — Patient Instructions (Addendum)
Thank you for coming to see me today. It was a pleasure.   You have an appointment at the [facility] on November 4th at 920 am.  You have an appointment to have an ultrasound of your abdomen on Thursday Oct 28th at [facility].  Please arrive at 845am,    Nothing to eat or drink after 11 pm on Wednesday night.  I will send an email to your case worker to let her know the dates and times of your appointments.  Please follow-up with me in 1-2 months  If you have any questions or concerns, please do not hesitate to call the office at [phone].  Best,   Dana Allan, MD Family Medicine Residency

## 2020-01-01 ENCOUNTER — Encounter: Payer: Self-pay | Admitting: Family Medicine

## 2020-01-01 NOTE — Assessment & Plan Note (Signed)
Denies any fevers, chills, weight loss or decrease in appetite. Benign abdominal exam.   Abdominal u/s scheduled for 10/28 Continue to follow up with Hematology as scheduled -Follow up in 4 weeks

## 2020-01-01 NOTE — Assessment & Plan Note (Signed)
No current concerns Follow up in 4 weeks

## 2020-01-04 ENCOUNTER — Ambulatory Visit (HOSPITAL_COMMUNITY): Payer: Medicaid Other

## 2020-01-11 ENCOUNTER — Inpatient Hospital Stay: Payer: Medicaid Other | Attending: Hematology | Admitting: Hematology

## 2020-01-11 ENCOUNTER — Other Ambulatory Visit: Payer: Self-pay

## 2020-01-11 ENCOUNTER — Inpatient Hospital Stay: Payer: Medicaid Other

## 2020-01-11 VITALS — BP 161/94 | HR 96 | Temp 96.5°F | Resp 18 | Ht 66.93 in | Wt 141.0 lb

## 2020-01-11 DIAGNOSIS — D72820 Lymphocytosis (symptomatic): Secondary | ICD-10-CM | POA: Diagnosis present

## 2020-01-11 DIAGNOSIS — E538 Deficiency of other specified B group vitamins: Secondary | ICD-10-CM | POA: Insufficient documentation

## 2020-01-11 DIAGNOSIS — D892 Hypergammaglobulinemia, unspecified: Secondary | ICD-10-CM

## 2020-01-11 DIAGNOSIS — Z87891 Personal history of nicotine dependence: Secondary | ICD-10-CM | POA: Insufficient documentation

## 2020-01-11 MED ORDER — B COMPLEX VITAMINS PO CAPS: 1.0000 | ORAL_CAPSULE | Freq: Every day | ORAL | 5 refills | Status: DC

## 2020-01-11 MED ORDER — B-12 1000 MCG SL SUBL
1000.0000 ug | SUBLINGUAL_TABLET | Freq: Every day | SUBLINGUAL | 5 refills | Status: DC
Start: 2020-01-11 — End: 2022-11-22
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
## 2020-01-15 LAB — MULTIPLE MYELOMA PANEL, SERUM
Albumin SerPl Elph-Mcnc: 3.2 g/dL (ref 2.9–4.4)
Albumin/Glob SerPl: 0.6 — ABNORMAL LOW (ref 0.7–1.7)
Alpha 1: 0.2 g/dL (ref 0.0–0.4)
Alpha2 Glob SerPl Elph-Mcnc: 0.4 g/dL (ref 0.4–1.0)
B-Globulin SerPl Elph-Mcnc: 1.1 g/dL (ref 0.7–1.3)
Gamma Glob SerPl Elph-Mcnc: 4.7 g/dL — ABNORMAL HIGH (ref 0.4–1.8)
Globulin, Total: 6.4 g/dL — ABNORMAL HIGH (ref 2.2–3.9)
IgA: 76 mg/dL (ref 61–437)
IgG (Immunoglobin G), Serum: 3697 mg/dL — ABNORMAL HIGH (ref 603–1613)
IgM (Immunoglobulin M), Srm: 2767 mg/dL — ABNORMAL HIGH (ref 20–172)
Total Protein ELP: 9.6 g/dL — ABNORMAL HIGH (ref 6.0–8.5)

## 2020-01-29 ENCOUNTER — Other Ambulatory Visit: Payer: Self-pay

## 2020-01-29 ENCOUNTER — Ambulatory Visit (HOSPITAL_COMMUNITY)
Admission: EM | Admit: 2020-01-29 | Discharge: 2020-01-29 | Disposition: A | Discharge status: Home / Self Care | Payer: Medicaid Other

## 2020-01-29 ENCOUNTER — Encounter (HOSPITAL_COMMUNITY): Payer: Self-pay | Admitting: Emergency Medicine

## 2020-01-29 DIAGNOSIS — M25562 Pain in left knee: Secondary | ICD-10-CM

## 2020-01-29 DIAGNOSIS — M25561 Pain in right knee: Secondary | ICD-10-CM

## 2020-01-29 DIAGNOSIS — M545 Low back pain, unspecified: Secondary | ICD-10-CM | POA: Diagnosis not present

## 2020-01-29 MED ORDER — PREDNISONE 20 MG PO TABS: 40.0000 mg | ORAL_TABLET | Freq: Every day | ORAL | 0 refills | Status: DC

## 2020-01-29 MED ORDER — CYCLOBENZAPRINE HCL 10 MG PO TABS: 10.0000 mg | ORAL_TABLET | Freq: Every evening | ORAL | 0 refills | Status: DC | PRN

## 2020-01-29 NOTE — ED Provider Notes (Signed)
[facility]    CSN: [id number] Arrival date & time: 01/29/20  1415      History   Chief Complaint Chief Complaint  Patient presents with  . Back Pain  . Hip Pain    HPI Arthur Ali is a 59 y.o. male.   Medical interpreter used today to help facilitate this visit with patient's consent. He presents today with b/l low back and lateral hip pain that started after a long car trip 3 days ago. He states it aches down into his bones. Denies weakness, numbness, tingling, discoloration, swelling but does have a limp since onset that he usually does not have. No known injury, hx of arthritis or other orthopedic issues. Has been trying a topical rub without benefit.       History reviewed. No pertinent past medical history.  Patient Active Problem List   Diagnosis Date Noted  . Lymphocytosis 12/21/2019  . Encounter for health examination of refugee 12/05/2019  . Screening-pulmonary TB 12/05/2019  . Cerumen impaction 12/05/2019    History reviewed. No pertinent surgical history.     Home Medications    Prior to Admission medications   Medication Sig Start Date End Date Taking? Authorizing Provider  NON FORMULARY Speaks of a cream he is using on painful areas that is not working   Yes [provider]  b complex vitamins capsule Take 1 capsule by mouth daily. 01/11/20   Johney Maine, MD  Cyanocobalamin (B-12) 1000 MCG SUBL Place 1,000 mcg under the tongue daily. 01/11/20   Johney Maine, MD  cyclobenzaprine (FLEXERIL) 10 MG tablet Take 1 tablet (10 mg total) by mouth at bedtime as needed for muscle spasms. 01/29/20   Particia Nearing, PA-C  predniSONE (DELTASONE) 20 MG tablet Take 2 tablets (40 mg total) by mouth daily with breakfast. 01/29/20   Particia Nearing, PA-C    Family History No family history on file.  Social History Social History   Tobacco Use  . Smoking status: Current Every Day Smoker    Types: Cigars    . Smokeless tobacco: Never Used  Substance Use Topics  . Alcohol use: Never  . Drug use: Never     Allergies   Patient has no known allergies.   Review of Systems Review of Systems PER HPI    Physical Exam Triage Vital Signs ED Triage Vitals  Enc Vitals Group     BP 01/29/20 1550 (!) 146/80     Pulse Rate 01/29/20 1550 (!) 102     Resp 01/29/20 1550 (!) 22     Temp 01/29/20 1550 98.5 F (36.9 C)     Temp Source 01/29/20 1550 Oral     SpO2 01/29/20 1550 98 %     Weight --      Height --      Head Circumference --      Peak Flow --      Pain Score 01/29/20 1545 7     Pain Loc --      Pain Edu? --      Excl. in GC? --    No data found.  Updated Vital Signs BP (!) 146/80 (BP Location: Right Arm)   Pulse (!) 102   Temp 98.5 F (36.9 C) (Oral)   Resp (!) 22   SpO2 98%   Visual Acuity Right Eye Distance:   Left Eye Distance:   Bilateral Distance:    Right Eye Near:   Left Eye Near:  Bilateral Near:     Physical Exam Vitals and nursing note reviewed.  Constitutional:      Appearance: Normal appearance.  HENT:     Head: Atraumatic.  Eyes:     Extraocular Movements: Extraocular movements intact.     Conjunctiva/sclera: Conjunctivae normal.  Cardiovascular:     Rate and Rhythm: Normal rate and regular rhythm.     Pulses: Normal pulses.  Pulmonary:     Effort: Pulmonary effort is normal.     Breath sounds: Normal breath sounds.  Musculoskeletal:        General: Tenderness (ttp b/l lateral hips and low back midline and lumbar paraspinal muscles) present. No swelling, deformity or signs of injury. Normal range of motion.     Cervical back: Normal range of motion and neck supple.     Comments: - SLR b/l  Skin:    General: Skin is warm and dry.     Findings: No erythema.  Neurological:     General: No focal deficit present.     Mental Status: He is oriented to person, place, and time.     Sensory: No sensory deficit.     Motor: No weakness.      Gait: Gait abnormal (limp present).  Psychiatric:        Mood and Affect: Mood normal.        Thought Content: Thought content normal.        Judgment: Judgment normal.     UC Treatments / Results  Labs (all labs ordered are listed, but only abnormal results are displayed) Labs Reviewed - No data to display  EKG   Radiology No results found.  Procedures Procedures (including critical care time)  Medications Ordered in UC Medications - No data to display  Initial Impression / Assessment and Plan / UC Course  I have reviewed the triage vital signs and the nursing notes.  Pertinent labs & imaging results that were available during my care of the patient were reviewed by me and considered in my medical decision making (see chart for details).     Arthritis flare vs mild b/l sciatica - tx with flexeril, prednisone burst, epsom salt baths, stretches. F/u if worsening or not resolving.   Final Clinical Impressions(s) / UC Diagnoses   Final diagnoses:  Acute midline low back pain without sciatica  Arthralgia of both lower legs   Discharge Instructions   None    ED Prescriptions    Medication Sig Dispense Auth. Provider   predniSONE (DELTASONE) 20 MG tablet Take 2 tablets (40 mg total) by mouth daily with breakfast. 10 tablet Particia Nearing, PA-C   cyclobenzaprine (FLEXERIL) 10 MG tablet Take 1 tablet (10 mg total) by mouth at bedtime as needed for muscle spasms. 15 tablet Particia Nearing, [place]     PDMP not reviewed this encounter.   Particia Nearing, [place] 01/29/20 1626

## 2020-01-29 NOTE — ED Triage Notes (Addendum)
Pain in lower back and both thighs.  Pain started on Friday  11/19.  Pain noticed after riding in a car.    Patient has had flu and covid vaccines x 2

## 2020-02-11 ENCOUNTER — Other Ambulatory Visit: Payer: Self-pay

## 2020-02-11 ENCOUNTER — Encounter (HOSPITAL_COMMUNITY): Payer: Self-pay | Admitting: Emergency Medicine

## 2020-02-11 ENCOUNTER — Emergency Department (HOSPITAL_COMMUNITY)
Admission: EM | Admit: 2020-02-11 | Discharge: 2020-02-12 | Disposition: A | Discharge status: Home / Self Care | Payer: Medicaid Other | Attending: Emergency Medicine | Admitting: Emergency Medicine

## 2020-02-11 DIAGNOSIS — R059 Cough, unspecified: Secondary | ICD-10-CM | POA: Diagnosis not present

## 2020-02-11 DIAGNOSIS — Z20822 Contact with and (suspected) exposure to covid-19: Secondary | ICD-10-CM | POA: Diagnosis not present

## 2020-02-11 DIAGNOSIS — F17290 Nicotine dependence, other tobacco product, uncomplicated: Secondary | ICD-10-CM | POA: Insufficient documentation

## 2020-02-11 DIAGNOSIS — M5442 Lumbago with sciatica, left side: Secondary | ICD-10-CM | POA: Insufficient documentation

## 2020-02-11 DIAGNOSIS — M549 Dorsalgia, unspecified: Secondary | ICD-10-CM | POA: Diagnosis present

## 2020-02-11 NOTE — ED Notes (Signed)
Call family Astrid Drafts)  to pick up patient. Patient speaks Kinyarwanda.

## 2020-02-11 NOTE — ED Triage Notes (Signed)
Pt c/o lower back pain and pain in both thighs. Denies fall/injury, no urinary changes. Also reports cough.

## 2020-02-12 ENCOUNTER — Emergency Department (HOSPITAL_COMMUNITY): Payer: Medicaid Other

## 2020-02-12 LAB — RESP PANEL BY RT-PCR (FLU A&B, COVID) ARPGX2
Influenza A by PCR: NEGATIVE
Influenza B by PCR: NEGATIVE
SARS Coronavirus 2 by RT PCR: NEGATIVE

## 2020-02-12 MED ORDER — HYDROCODONE-ACETAMINOPHEN 5-325 MG PO TABS
1.0000 | ORAL_TABLET | ORAL | 0 refills | Status: DC | PRN
Start: 2020-02-12 — End: 2022-11-22

## 2020-02-12 MED ORDER — BENZONATATE 100 MG PO CAPS: 100.0000 mg | ORAL_CAPSULE | Freq: Three times a day (TID) | ORAL | 0 refills | Status: DC

## 2020-02-12 MED ORDER — OXYCODONE-ACETAMINOPHEN 5-325 MG PO TABS
2.0000 | ORAL_TABLET | Freq: Once | ORAL | Status: AC
  Administered 2020-02-12: 2 via ORAL
  Filled 2020-02-12: qty 2

## 2020-02-12 NOTE — ED Provider Notes (Signed)
[facility] EMERGENCY DEPARTMENT Provider Note   CSN: [id number] Arrival date & time: 02/11/20  1850     History Chief Complaint  Patient presents with  . Back Pain    Arthur Ali is a 59 y.o. male.  The history is provided by the patient and medical records.   Family member at bedside serving as language interpreter as he speaks [place] which is not available through our provided service.  He does not understand swahili interpreter.  59 y.o. M with hx of lymphocytosis, presenting to the ED for multiple complaints.    1.  Back pain-- ongoing for 2-3 weeks.  Started after long car trip.  No falls/trauma/heavy lifting recently.  No prior back injuries or surgeries.  Was prescribed muscle relaxers and prednisone from urgent care without any relief.  Pain with some radiation into left thigh.  Pain worse with movement, changing position, or lifting up left leg. He has not had any focal numbness or weakness of the legs.  No bowel or bladder incontinence.  Denies any prior back injuries or surgeries.  2.  Cough-- present for the past week, non-productive.  No fever/chills/sick contacts.  No covid exposures. Not vaccinated for covid.  Patient recently immigrated to [place] from [place].  TB screenings have been negative and he has no history of prior T infection.  Denies night sweats, weight loss, etc.  History reviewed. No pertinent past medical history.  Patient Active Problem List   Diagnosis Date Noted  . Lymphocytosis 12/21/2019  . Encounter for health examination of refugee 12/05/2019  . Screening-pulmonary TB 12/05/2019  . Cerumen impaction 12/05/2019    History reviewed. No pertinent surgical history.     No family history on file.  Social History   Tobacco Use  . Smoking status: Current Every Day Smoker    Types: Cigars  . Smokeless tobacco: Never Used  Substance Use Topics  . Alcohol use: Never  . Drug use: Never     Home Medications Prior to Admission medications   Medication Sig Start Date End Date Taking? Authorizing Provider  b complex vitamins capsule Take 1 capsule by mouth daily. 01/11/20   Johney Maine, MD  Cyanocobalamin (B-12) 1000 MCG SUBL Place 1,000 mcg under the tongue daily. 01/11/20   Johney Maine, MD  cyclobenzaprine (FLEXERIL) 10 MG tablet Take 1 tablet (10 mg total) by mouth at bedtime as needed for muscle spasms. 01/29/20   Particia Nearing, PA-C  NON FORMULARY Speaks of a cream he is using on painful areas that is not working    [provider]  predniSONE (DELTASONE) 20 MG tablet Take 2 tablets (40 mg total) by mouth daily with breakfast. 01/29/20   Particia Nearing, PA-C    Allergies    Patient has no known allergies.  Review of Systems   Review of Systems  Respiratory: Positive for cough.   Musculoskeletal: Positive for back pain.  All other systems reviewed and are negative.   Physical Exam Updated Vital Signs BP 108/85 (BP Location: Left Arm)   Pulse 96   Temp 97.9 F (36.6 C) (Oral)   Resp 16   Ht 5\' 9"  (1.753 m)   Wt 70.3 kg   SpO2 100%   BMI 22.89 kg/m   Physical Exam Vitals and nursing note reviewed.  Constitutional:      Appearance: He is well-developed.  HENT:     Head: Normocephalic and atraumatic.  Eyes:  Conjunctiva/sclera: Conjunctivae normal.     Pupils: Pupils are equal, round, and reactive to light.  Cardiovascular:     Rate and Rhythm: Normal rate and regular rhythm.     Heart sounds: Normal heart sounds.  Pulmonary:     Effort: Pulmonary effort is normal.     Breath sounds: Normal breath sounds. No wheezing or rhonchi.     Comments: Dry cough on exam Abdominal:     General: Bowel sounds are normal.     Palpations: Abdomen is soft.  Musculoskeletal:        General: Normal range of motion.     Cervical back: Normal range of motion.       Back:     Comments: Diffuse tenderness throughout the  back, worse along lower lumbar spine; + SLR on left, - on right; DP pulses intact bilaterally, able to lift each leg off bed, good distal sensation, remains ambulatory  Skin:    General: Skin is warm and dry.  Neurological:     Mental Status: He is alert and oriented to person, place, and time.     ED Results / Procedures / Treatments   Labs (all labs ordered are listed, but only abnormal results are displayed) Labs Reviewed  RESP PANEL BY RT-PCR (FLU A&B, COVID) [id number]    EKG None  Radiology DG Chest 2 View  Result Date: 02/12/2020 CLINICAL DATA:  Back pain and cough EXAM: CHEST - 2 VIEW COMPARISON:  12/08/2019 FINDINGS: Generous lung volumes. There is no edema, consolidation, effusion, or pneumothorax. Normal heart size and mediastinal contours. IMPRESSION: 1. Probable hyperinflation. 2. No focal abnormality. Electronically Signed   By: Marnee Spring M.D.   On: 02/12/2020 04:56   DG Lumbar Spine Complete  Result Date: 02/12/2020 CLINICAL DATA:  Back pain and cough EXAM: LUMBAR SPINE - COMPLETE 4+ VIEW COMPARISON:  None. FINDINGS: Generalized disc narrowing and endplate spurring in the lumbar spine. No acute fracture or endplate erosion. Mild T11 and T12 wedge like appearance which is likely from remodeling. IMPRESSION: 1. No acute finding. 2. Generalized lumbar disc narrowing and endplate spurring. Electronically Signed   By: Marnee Spring M.D.   On: 02/12/2020 04:55    Procedures Procedures (including critical care time)  Medications Ordered in ED Medications  oxyCODONE-acetaminophen (PERCOCET/ROXICET) 5-325 MG per tablet 2 tablet (2 tablets Oral Given 02/12/20 0414)    ED Course  I have reviewed the triage vital signs and the nursing notes.  Pertinent labs & imaging results that were available during my care of the patient were reviewed by me and considered in my medical decision making (see chart for details).    MDM Rules/Calculators/A&P  59 year old male  presenting to the ED with multiple complaints.  Family member serving as interpreter as he only speaks [place] which is not available through our interpreter service.  He does not understand a Swahili interpreter.  1.  Back pain-- ongoing for 2-3 weeks, started after long car ride.  No new trauma/falls.  Pain radiating into left leg, worse with movement, position change.  + SLR on exam. No focal numbness/weakness of the legs.  No bowel/bladder incontinence.  Remains ambulatory.  No fevers, hx of IVDU, cancer, or other red flag symptoms.  No prior imaging up until this point so x-ray obtained, diffuse lumbar disc narrowing and endplate spurring.  Plan to discharge home with pain control.  Will need close OP follow-up.  May ultimately need MRI if pain worsening/not improving but without red  flag symptoms today do not feel this needs to be done emergently.  2.  Cough-- without fever/chills. No sick contacts/covid exposures.  Lungs CTAB.  No SOB or chest pain.  CXR clear.  VSS.  Recently emigrated from [place].  Recent TB screening negative, no hx of same.  No night sweats or weight loss.  May be viral process.  resp panel has been sent.  Requested medication for cough-- tessalon written.  Encouraged close follow-up with PCP.  Return here for new concerns.   Final Clinical Impression(s) / ED Diagnoses Final diagnoses:  Acute midline low back pain with left-sided sciatica  Cough    Rx / DC Orders ED Discharge Orders         Ordered    HYDROcodone-acetaminophen (NORCO/VICODIN) 5-325 MG tablet  Every 4 hours PRN        02/12/20 0558    benzonatate (TESSALON) 100 MG capsule  Every 8 hours        02/12/20 0558           Garlon Hatchet, PA-C 02/12/20 4327    Gilda Crease, MD 02/12/20 [phone]

## 2020-02-12 NOTE — Discharge Instructions (Addendum)
Take the prescribed medication as directed.  Would recommend taking motrin with your hydrocodone for pain. You will be contacted if viral panel is positive.  Can use tessalon for cough. Follow-up with your primary care doctor. Return to the ED for new or worsening symptoms-- numbness/weakness of the legs, bowel or bladder incontinence, etc.

## 2020-02-14 ENCOUNTER — Other Ambulatory Visit: Payer: Self-pay | Admitting: Family Medicine

## 2020-02-14 ENCOUNTER — Other Ambulatory Visit: Payer: Self-pay

## 2020-02-14 ENCOUNTER — Ambulatory Visit: Payer: Medicaid Other | Admitting: Family Medicine

## 2020-02-14 ENCOUNTER — Encounter: Payer: Self-pay | Admitting: Family Medicine

## 2020-02-14 VITALS — BP 120/82 | HR 100 | Ht 69.0 in | Wt 140.0 lb

## 2020-02-14 DIAGNOSIS — M5416 Radiculopathy, lumbar region: Secondary | ICD-10-CM

## 2020-02-14 DIAGNOSIS — M5442 Lumbago with sciatica, left side: Secondary | ICD-10-CM

## 2020-02-14 MED ORDER — MELOXICAM 15 MG PO TABS: 15.0000 mg | ORAL_TABLET | Freq: Every day | ORAL | 0 refills | Status: DC

## 2020-02-14 MED ORDER — GABAPENTIN 100 MG PO CAPS: 100.0000 mg | ORAL_CAPSULE | Freq: Every day | ORAL | 0 refills | Status: DC

## 2020-02-14 NOTE — Telephone Encounter (Signed)
Please send to Dr Corky Downs for refill.  Thanks

## 2020-02-14 NOTE — Progress Notes (Signed)
    SUBJECTIVE:   CHIEF COMPLAINT / HPI:   Back pain: Patient is unsure of the day, but around November 22 he was in a car for approximately 20 minutes and when he went to get out of the car he felt a sudden sharp back pain that caused him to fall over.  Since that time he has had severe low back pain with radiation down the left leg.  He went to the urgent care that day and then again to the emergency department on 5 December.  None of the medications he has been given thus far have helped him.  This includes muscle relaxer and steroids.  He has had one episode of similar pain prior to this around the time he got a flu shot, but does not have chronic back pain at baseline.  Pain has been constant and not improving since this first occurred.  He does not have any incontinence.  No muscle weakness.  PERTINENT  PMH / PSH: None  OBJECTIVE:   BP 120/82   Pulse 100   Ht 5\' 9"  (1.753 m)   Wt 140 lb (63.5 kg)   SpO2 99%   BMI 20.67 kg/m   General: Alert, oriented.  Thin male, appears stated age.  Moderate to severe pain. MSK: Patient with antalgic gait.  Has difficulty getting up to examining table due to pain.  Keeps left leg extended while in chair and when sitting on bed.  Straight leg test positive for left leg when raising his leg only an inch or 2 off the table.  Negative right-sided straight leg test.  Tenderness to palpation in the L5/S1 region medially and radiating out bilaterally.  ASSESSMENT/PLAN:   Bilateral low back pain with left-sided sciatica Pain now ongoing and unimproved for approximately 3 weeks.  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  Pain unresponsive to medications as far and pain is significantly impairing his ability to function. -Lumbar MRI -Referral sports medicine -Meloxicam 15 mg -Gabapentin 100 mg nightly -Follow-up in 1 to 2 weeks.     ,  MD [facility]

## 2020-02-14 NOTE — Patient Instructions (Signed)
It was nice to meet you today,  I have done the following today:  -We ordered an MRI of your spine to check for disc herniation or other causes of your pain.  -I have a prescribed a medication that I want you to take in the morning called meloxicam.  Take 1 tablet in the morning.  I have prescribed a different medication called gabapentin that I want you to take 1 tablet at night before bedtime.  I have referred you to the sports medicine doctors in case the need to do a local steroid injection to help with your pain  -As you feel better, it will be good for you to do exercises and stretching, but I understand that if it is too painful at this time.  Have a great day,  Frederic Jericho, MD

## 2020-02-14 NOTE — Assessment & Plan Note (Signed)
Pain now ongoing and unimproved for approximately 3 weeks.  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  Pain unresponsive to medications as far and pain is significantly impairing his ability to function. -Lumbar MRI -Referral sports medicine -Meloxicam 15 mg -Gabapentin 100 mg nightly -Follow-up in 1 to 2 weeks.

## 2020-02-23 ENCOUNTER — Ambulatory Visit (HOSPITAL_COMMUNITY)
Admission: RE | Admit: 2020-02-23 | Discharge: 2020-02-23 | Disposition: A | Discharge status: Home / Self Care | Payer: Medicaid Other | Source: Ambulatory Visit | Attending: Family Medicine | Admitting: Family Medicine

## 2020-02-23 ENCOUNTER — Other Ambulatory Visit: Payer: Self-pay

## 2020-02-23 DIAGNOSIS — M5416 Radiculopathy, lumbar region: Secondary | ICD-10-CM

## 2020-02-23 DIAGNOSIS — M5442 Lumbago with sciatica, left side: Secondary | ICD-10-CM | POA: Insufficient documentation

## 2020-03-11 ENCOUNTER — Ambulatory Visit: Payer: Medicaid Other | Admitting: Family Medicine

## 2020-03-11 NOTE — Progress Notes (Deleted)
    SUBJECTIVE:   CHIEF COMPLAINT / HPI:   Back pain: mobic, gabapentin, sports med ref?.  Bulging and endplate osteophytes.    PERTINENT  PMH / PSH: ***  OBJECTIVE:   There were no vitals taken for this visit.  ***  ASSESSMENT/PLAN:   No problem-specific Assessment & Plan notes found for this encounter.     Sandre Kitty, MD [facility]

## 2020-03-21 ENCOUNTER — Ambulatory Visit (HOSPITAL_COMMUNITY): Payer: Medicaid Other

## 2020-03-25 ENCOUNTER — Ambulatory Visit: Payer: Medicaid Other | Admitting: Family Medicine

## 2020-08-13 ENCOUNTER — Encounter (HOSPITAL_COMMUNITY): Payer: Self-pay

## 2020-08-13 ENCOUNTER — Other Ambulatory Visit: Payer: Self-pay

## 2020-08-13 ENCOUNTER — Ambulatory Visit (HOSPITAL_COMMUNITY)
Admission: EM | Admit: 2020-08-13 | Discharge: 2020-08-13 | Disposition: A | Discharge status: Home / Self Care | Payer: Self-pay | Attending: Urgent Care | Admitting: Urgent Care

## 2020-08-13 DIAGNOSIS — R519 Headache, unspecified: Secondary | ICD-10-CM | POA: Insufficient documentation

## 2020-08-13 DIAGNOSIS — M5136 Other intervertebral disc degeneration, lumbar region: Secondary | ICD-10-CM

## 2020-08-13 DIAGNOSIS — Z791 Long term (current) use of non-steroidal anti-inflammatories (NSAID): Secondary | ICD-10-CM | POA: Insufficient documentation

## 2020-08-13 DIAGNOSIS — M5126 Other intervertebral disc displacement, lumbar region: Secondary | ICD-10-CM | POA: Insufficient documentation

## 2020-08-13 DIAGNOSIS — Z20822 Contact with and (suspected) exposure to covid-19: Secondary | ICD-10-CM | POA: Insufficient documentation

## 2020-08-13 DIAGNOSIS — R059 Cough, unspecified: Secondary | ICD-10-CM | POA: Insufficient documentation

## 2020-08-13 DIAGNOSIS — F17290 Nicotine dependence, other tobacco product, uncomplicated: Secondary | ICD-10-CM | POA: Insufficient documentation

## 2020-08-13 DIAGNOSIS — M545 Low back pain, unspecified: Secondary | ICD-10-CM

## 2020-08-13 DIAGNOSIS — Z79899 Other long term (current) drug therapy: Secondary | ICD-10-CM | POA: Insufficient documentation

## 2020-08-13 MED ORDER — BENZONATATE 100 MG PO CAPS
100.0000 mg | ORAL_CAPSULE | Freq: Three times a day (TID) | ORAL | 0 refills | Status: DC | PRN
Start: 2020-08-13 — End: 2022-11-02

## 2020-08-13 MED ORDER — NAPROXEN 375 MG PO TABS: 375.0000 mg | ORAL_TABLET | Freq: Two times a day (BID) | ORAL | 0 refills | Status: DC

## 2020-08-13 MED ORDER — PROMETHAZINE-DM 6.25-15 MG/5ML PO SYRP: 5.0000 mL | ORAL_SOLUTION | Freq: Every evening | ORAL | 0 refills | Status: DC | PRN

## 2020-08-13 MED ORDER — METHYLPREDNISOLONE SODIUM SUCC 125 MG IJ SOLR
INTRAMUSCULAR | Status: AC
  Filled 2020-08-13: qty 2

## 2020-08-13 MED ORDER — TIZANIDINE HCL 4 MG PO TABS: 4.0000 mg | ORAL_TABLET | Freq: Three times a day (TID) | ORAL | 0 refills | Status: DC | PRN

## 2020-08-13 MED ORDER — METHYLPREDNISOLONE SODIUM SUCC 125 MG IJ SOLR
125.0000 mg | Freq: Once | INTRAMUSCULAR | Status: AC
  Administered 2020-08-13: 125 mg via INTRAMUSCULAR

## 2020-08-13 NOTE — ED Triage Notes (Signed)
Pt presents with back pain X 4 days.  He states he had a headache and then developed a cough.

## 2020-08-13 NOTE — ED Provider Notes (Signed)
[facility]   MRN: [id number] DOB: 09-15-1960  Subjective:   Arthur Ali is a 60 y.o. male presenting for 4 day history of recurrent moderate-severe low back pain. Symptoms started out with a frontal headache, then had a cough. Denies any falls, trauma, fever, chest pain, shob, hemoptysis, n/v, abdominal pain.  Patient smokes cigars a few times a week.  No history of lung disorders including asthma or COPD.  No current facility-administered medications for this encounter.  Current Outpatient Medications:  .  b complex vitamins capsule, Take 1 capsule by mouth daily., Disp: 30 capsule, Rfl: 5 .  benzonatate (TESSALON) 100 MG capsule, Take 1 capsule (100 mg total) by mouth every 8 (eight) hours., Disp: 21 capsule, Rfl: 0 .  Cyanocobalamin (B-12) 1000 MCG SUBL, Place 1,000 mcg under the tongue daily., Disp: 30 tablet, Rfl: 5 .  cyclobenzaprine (FLEXERIL) 10 MG tablet, Take 1 tablet (10 mg total) by mouth at bedtime as needed for muscle spasms., Disp: 15 tablet, Rfl: 0 .  gabapentin (NEURONTIN) 100 MG capsule, Take 1 capsule (100 mg total) by mouth at bedtime., Disp: 90 capsule, Rfl: 0 .  HYDROcodone-acetaminophen (NORCO/VICODIN) 5-325 MG tablet, Take 1 tablet by mouth every 4 (four) hours as needed., Disp: 10 tablet, Rfl: 0 .  meloxicam (MOBIC) 15 MG tablet, Take 1 tablet (15 mg total) by mouth daily., Disp: 30 tablet, Rfl: 0 .  NON FORMULARY, Speaks of a cream he is using on painful areas that is not working, Disp: , Rfl:  .  predniSONE (DELTASONE) 20 MG tablet, Take 2 tablets (40 mg total) by mouth daily with breakfast., Disp: 10 tablet, Rfl: 0   No Known Allergies  History reviewed. No pertinent past medical history.   History reviewed. No pertinent surgical history.  Family History  Family history unknown: Yes    Social History   Tobacco Use  . Smoking status: Current Every Day Smoker    Types: Cigars  . Smokeless tobacco: Never Used  Substance Use  Topics  . Alcohol use: Never  . Drug use: Never    ROS   Objective:   Vitals: BP 107/73 (BP Location: Right Arm)   Pulse 92   Temp 98.3 F (36.8 C) (Oral)   Resp 18   SpO2 97%   Physical Exam Constitutional:      General: He is not in acute distress.    Appearance: Normal appearance. He is well-developed. He is not ill-appearing, toxic-appearing or diaphoretic.  HENT:     Head: Normocephalic and atraumatic.     Right Ear: External ear normal.     Left Ear: External ear normal.     Nose: Nose normal.     Mouth/Throat:     Mouth: Mucous membranes are moist.     Pharynx: Oropharynx is clear.  Eyes:     General: No scleral icterus.    Extraocular Movements: Extraocular movements intact.     Pupils: Pupils are equal, round, and reactive to light.  Cardiovascular:     Rate and Rhythm: Normal rate and regular rhythm.     Heart sounds: Normal heart sounds. No murmur heard. No friction rub. No gallop.   Pulmonary:     Effort: Pulmonary effort is normal. No respiratory distress.     Breath sounds: Normal breath sounds. No stridor. No wheezing, rhonchi or rales.  Musculoskeletal:     Lumbar back: Spasms and tenderness present. No swelling, edema, deformity, signs of trauma, lacerations or bony  tenderness. Decreased range of motion. Negative right straight leg raise test and negative left straight leg raise test. No scoliosis.  Neurological:     Mental Status: He is alert and oriented to person, place, and time.     Coordination: Coordination abnormal (moving gingerly favoring low back).     Deep Tendon Reflexes: Reflexes normal.  Psychiatric:        Mood and Affect: Mood normal.        Behavior: Behavior normal.        Thought Content: Thought content normal.     Assessment and Plan :   PDMP not reviewed this encounter.  1. Acute midline low back pain, unspecified whether sciatica present   2. Bulging lumbar disc   3. Generalized headache   4. Cough     Given his  recent diagnosis as seen on MRI of multiple bulging disks we will manage his severe back pain with IM Solu-Medrol, followed by naproxen and tizanidine.  Emphasized need for follow-up with the neuro spine clinic.  COVID-19 testing pending.  Recommended supportive care for his cough.  Could also be related to his smoking and therefore recommended he avoid this for now. Counseled patient on potential for adverse effects with medications prescribed/recommended today, ER and return-to-clinic precautions discussed, patient verbalized understanding.    Wallis Bamberg, PA-C 08/13/20 1751

## 2020-08-13 NOTE — Discharge Instructions (Addendum)
Please make sure to make a follow up appointment with a back specialist. Their information is listed below. You can take naproxen twice daily with food for your back pain, tizanidine as well as a muscle relaxant.   We will notify you of your COVID-19 test results as they arrive and may take between 24 to 48 hours.  I encourage you to sign up for MyChart if you have not already done so as this can be the easiest way for [place] to communicate results to you online or through a phone app.  In the meantime, if you develop worsening symptoms including fever, chest pain, shortness of breath despite our current treatment plan then please report to the emergency room as this may be a sign of worsening status from possible COVID-19 infection.  Otherwise, we will manage this as a viral syndrome. For sore throat or cough try using a honey-based tea. Use 3 teaspoons of honey with juice squeezed from half lemon. Place shaved pieces of ginger into 1/2-1 cup of water and warm over stove top. Then mix the ingredients and repeat every 4 hours as needed. Please take Tylenol 500mg -650mg  every 6 hours for aches and pains, fevers. Hydrate very well with at least 2 liters of water. Eat light meals such as soups to replenish electrolytes and soft fruits, veggies. Start an antihistamine like Zyrtec, Allegra or Claritin for postnasal drainage, sinus congestion.

## 2020-08-14 LAB — SARS CORONAVIRUS 2 (TAT 6-24 HRS): SARS Coronavirus 2: NEGATIVE

## 2020-12-24 ENCOUNTER — Other Ambulatory Visit: Payer: Self-pay | Admitting: Family Medicine

## 2020-12-24 DIAGNOSIS — D72820 Lymphocytosis (symptomatic): Secondary | ICD-10-CM

## 2021-01-07 ENCOUNTER — Other Ambulatory Visit: Payer: Self-pay

## 2021-01-07 ENCOUNTER — Ambulatory Visit (INDEPENDENT_AMBULATORY_CARE_PROVIDER_SITE_OTHER): Payer: Self-pay

## 2021-01-07 ENCOUNTER — Telehealth: Payer: Self-pay

## 2021-01-07 DIAGNOSIS — Z23 Encounter for immunization: Secondary | ICD-10-CM

## 2021-01-07 NOTE — Telephone Encounter (Signed)
Opened in error

## 2021-01-07 NOTE — Progress Notes (Signed)
Using interpreter [phone] for entire visit.  Patient advised of vaccines he will be receiving today.  Patient advised since he has no insurance we will bill him.   Patient ok with receiving all vaccines recommended to him.   Patient received Hep B, Flu, Tdap- tolerated well.

## 2021-08-12 IMAGING — MR MR LUMBAR SPINE W/O CM
4 of 5 series · 18 of 48 positions shown · non-contrast
Comparison: Radiography 02/12/2020

CLINICAL DATA: Low back pain with left radicular pain.

EXAM:
MRI LUMBAR SPINE WITHOUT CONTRAST
TECHNIQUE: Multiplanar, multisequence MR imaging of the lumbar spine was
performed. No intravenous contrast was administered.

[Series 3: T2 · sagittal · 4.0mm · 0.55mm/px · 7 of 16 slices shown (1 of 2)]
[im 1/16]
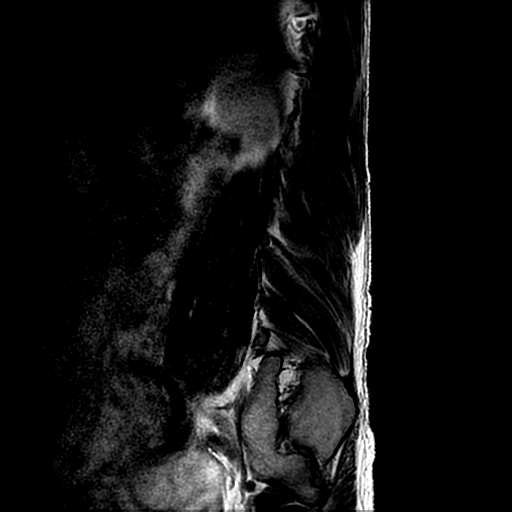
[im 3/16]
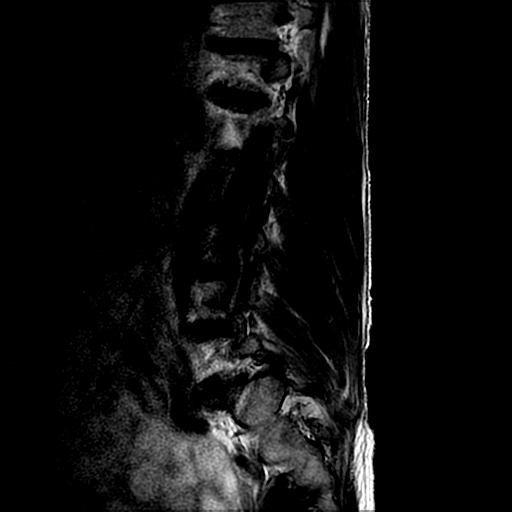
[im 6/16]
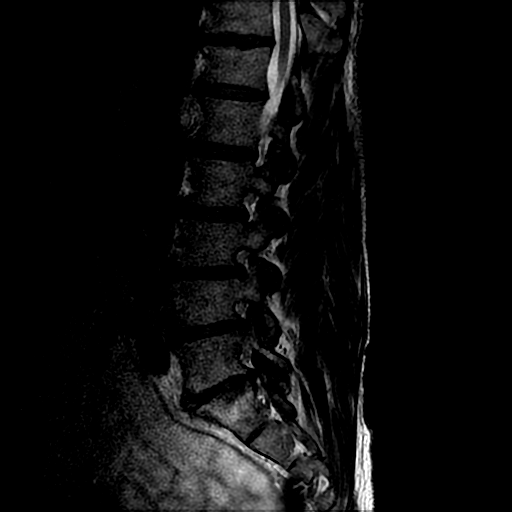
[im 8/16]
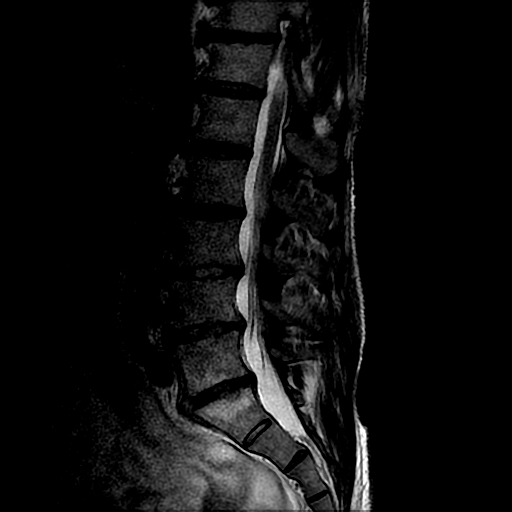
[im 11/16]
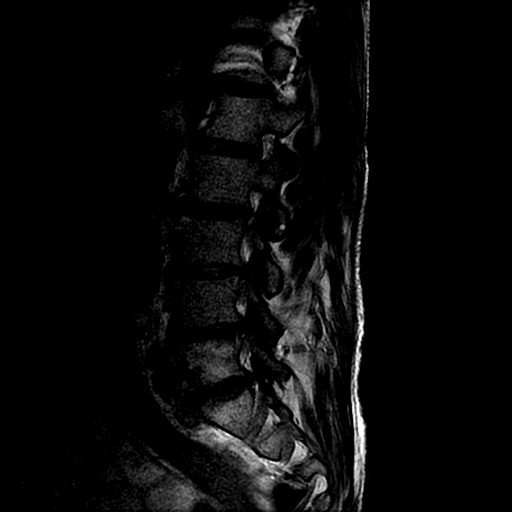
[im 13/16]
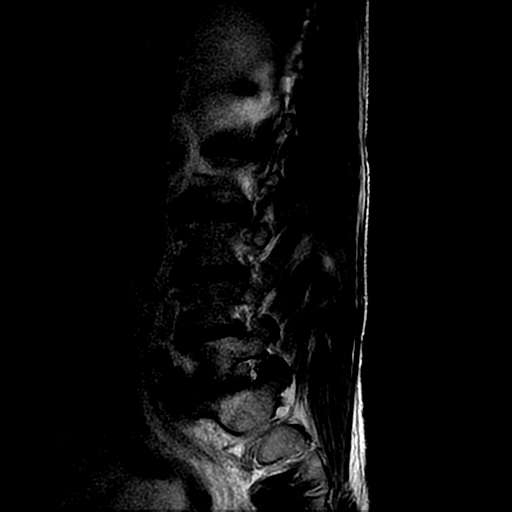
[im 16/16]
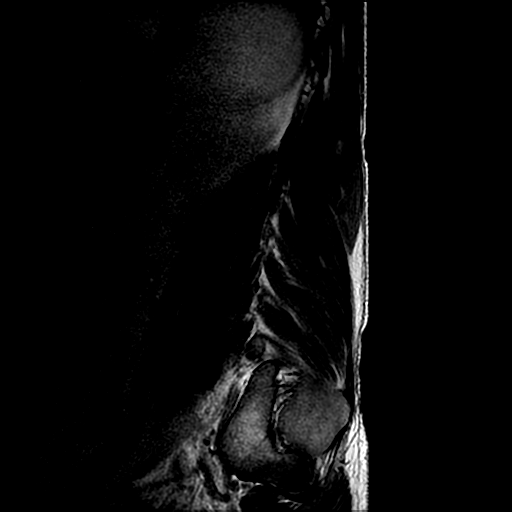

[Series 5: T1 · sagittal · 4.0mm · 0.55mm/px · 3 of 16 slices shown (1 of 2)]
[im 3/16]
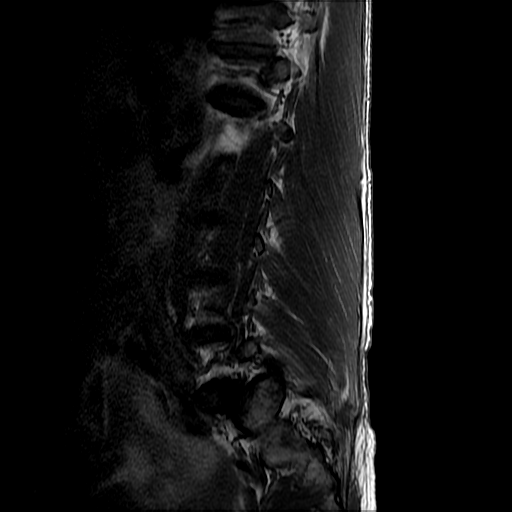
[im 8/16]
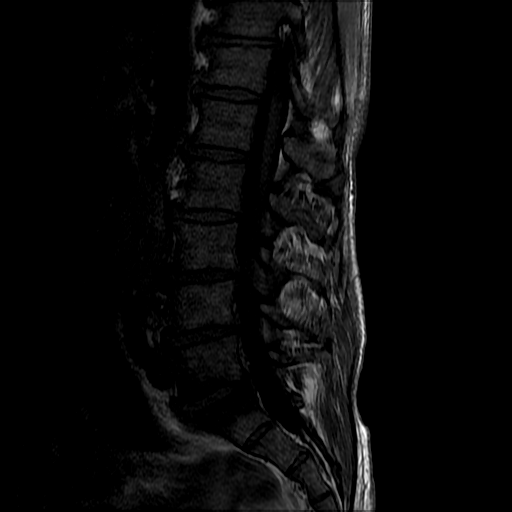
[im 13/16]
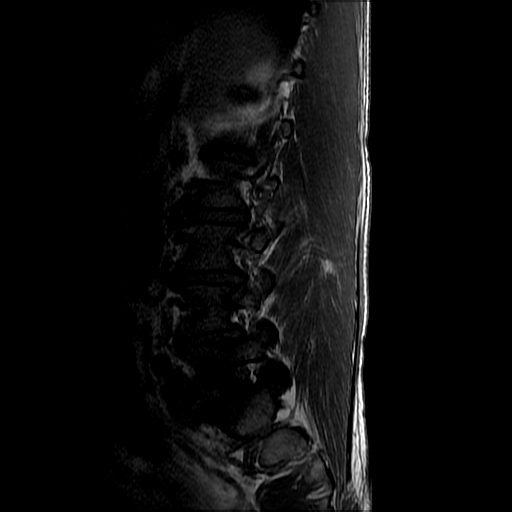

[Series 6: T2 · axial · 4.0mm · 0.39mm/px · z∈[-120,+37]mm · 5 of 36 slices shown (2 of 2)]
[im 1/36]
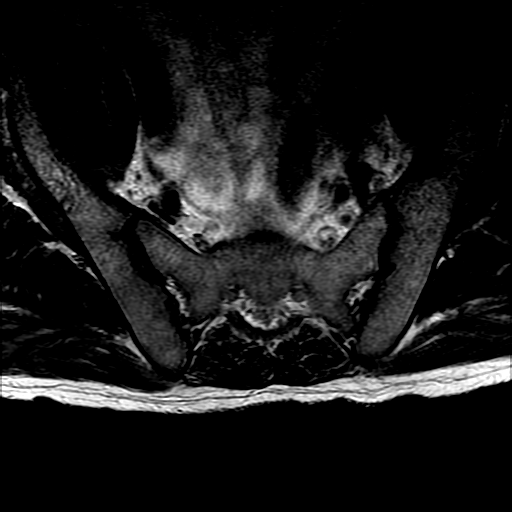
[im 6/36]
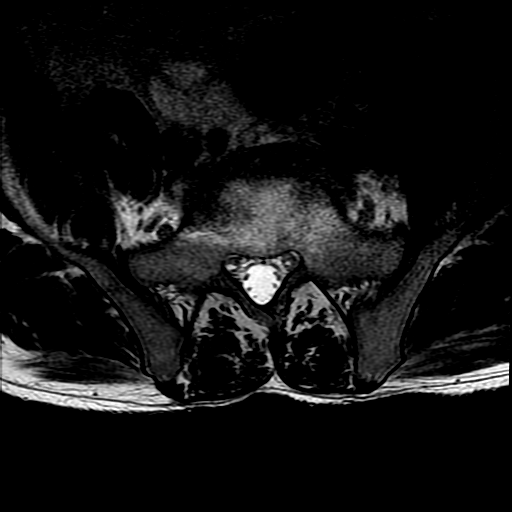
[im 11/36]
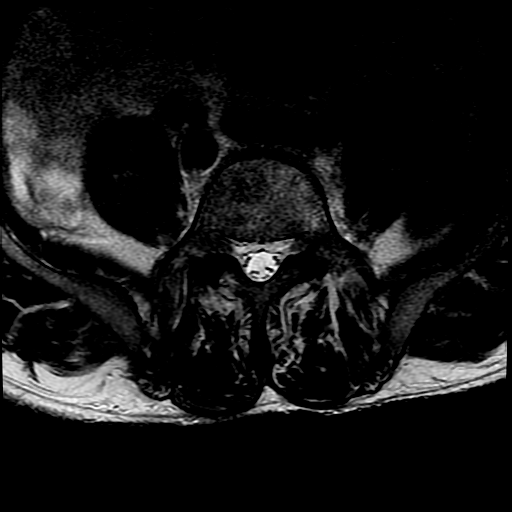
[im 19/36]
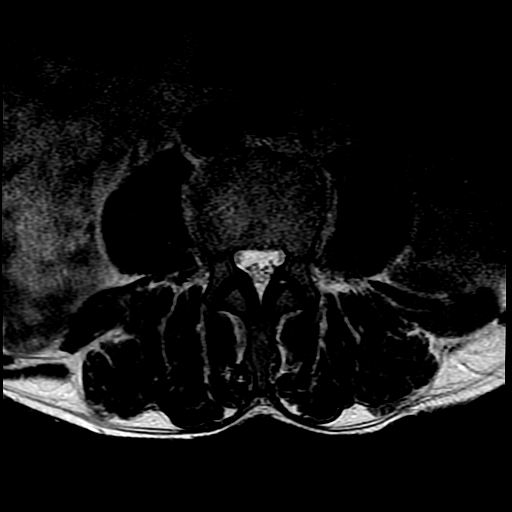
[im 30/36]
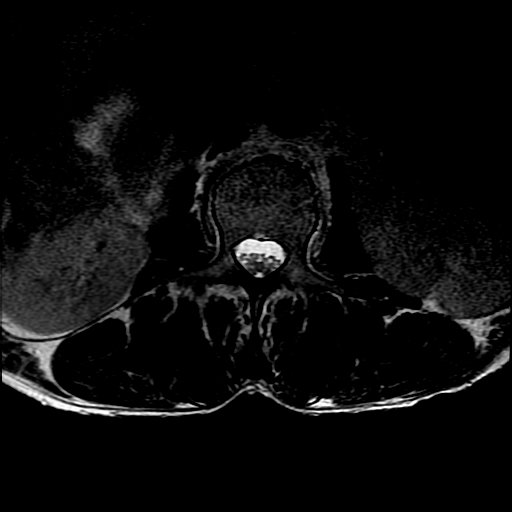

[Series 7: T1 · axial · 4.0mm · 0.39mm/px · z∈[-95,+37]mm · 3 of 36 slices shown (2 of 2)]
[im 6/36]
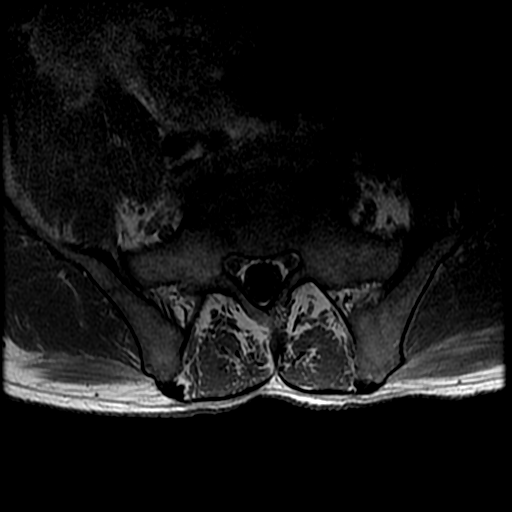
[im 19/36]
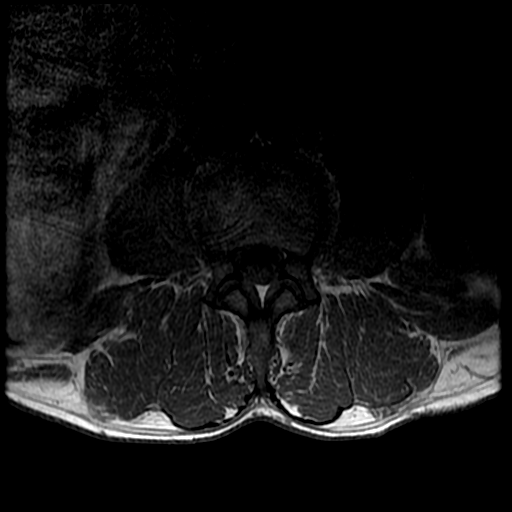
[im 30/36]
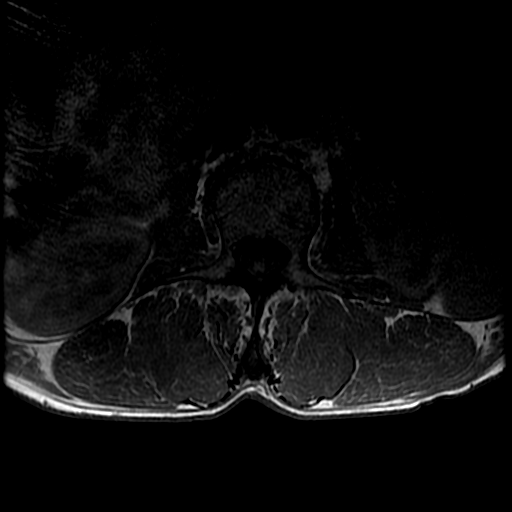

[18 of 48 positions shown; findings below may reference images not displayed]

FINDINGS: Segmentation:  5 lumbar type vertebral bodies.

Alignment: Mild scoliotic curvature convex to the left with the apex
at L3-4.

Vertebrae: Edematous changes of the endplates at L5-S1 which could
relate to low back pain.

Conus medullaris and cauda equina: Conus extends to the L2-3 level.
Conus and cauda equina appear normal.

Paraspinal and other soft tissues: Question splenomegaly.

Disc levels:

No significant finding at L2-3 or above.

L3-4: Endplate osteophytes and mild bulging of the disc. Mild
narrowing of the lateral recesses but no apparent neural
compression.

L4-5: Endplate osteophytes and bulging of the disc. Mild facet and
ligamentous prominence. Mild stenosis of both lateral recesses.
Definite neural compression is not established, but there is some
potential the L5 nerves could be affected in the lateral recesses.

L5-S1: Disc degeneration with posterior disc bulging and anterior
disc extrusion. Discogenic endplate marrow changes at L5 and S1,
likely associated with low back pain. No high T2 signal material
within the disc space to suggest infection. Mild facet and
ligamentous hypertrophy, right more than left. Narrowing of the
subarticular lateral recesses and neural foramina on both sides.
Definite neural compression is not established, but neural
irritation could occur.
IMPRESSION: 1. L5-S1: Endplate osteophytes and posterior bulging of the disc.
Anterior disc extrusion. Discogenic endplate marrow changes with
pronounced edema which could relate to low back pain. Mild facet and
ligamentous hypertrophy, right more than left. Narrowing of the
subarticular lateral recesses and neural foramina on both sides.
Definite neural compression is not established, but neural
irritation could occur.
2. L4-5: Endplate osteophytes and bulging of the disc. Mild facet
and ligamentous prominence. Mild stenosis of both lateral recesses.
Definite neural compression is not established, but there is some
potential the L5 nerves could be affected in the lateral recesses.
3. L3-4: Endplate osteophytes and bulging of the disc. Mild
narrowing of the lateral recesses but no apparent neural
compression.
4. Question splenomegaly.

## 2022-11-02 ENCOUNTER — Encounter (HOSPITAL_COMMUNITY): Payer: Self-pay

## 2022-11-02 ENCOUNTER — Ambulatory Visit (HOSPITAL_COMMUNITY)
Admission: EM | Admit: 2022-11-02 | Discharge: 2022-11-02 | Disposition: A | Discharge status: Home / Self Care | Payer: BC Managed Care – PPO

## 2022-11-02 DIAGNOSIS — J209 Acute bronchitis, unspecified: Secondary | ICD-10-CM | POA: Diagnosis not present

## 2022-11-02 DIAGNOSIS — J42 Unspecified chronic bronchitis: Secondary | ICD-10-CM | POA: Diagnosis not present

## 2022-11-02 MED ORDER — BENZONATATE 100 MG PO CAPS: 100.0000 mg | ORAL_CAPSULE | Freq: Three times a day (TID) | ORAL | 0 refills | Status: DC | PRN

## 2022-11-02 MED ORDER — AZITHROMYCIN 250 MG PO TABS: ORAL_TABLET | ORAL | 0 refills | Status: DC

## 2022-11-02 MED ORDER — METHYLPREDNISOLONE ACETATE 40 MG/ML IJ SUSP
40.0000 mg | Freq: Once | INTRAMUSCULAR | Status: AC
  Administered 2022-11-02: 40 mg via INTRAMUSCULAR

## 2022-11-02 MED ORDER — PREDNISONE 20 MG PO TABS: 40.0000 mg | ORAL_TABLET | Freq: Every day | ORAL | 0 refills | Status: AC

## 2022-11-02 MED ORDER — IPRATROPIUM-ALBUTEROL 0.5-2.5 (3) MG/3ML IN SOLN
3.0000 mL | Freq: Once | RESPIRATORY_TRACT | Status: AC
  Administered 2022-11-02: 3 mL via RESPIRATORY_TRACT

## 2022-11-02 MED ORDER — IPRATROPIUM-ALBUTEROL 0.5-2.5 (3) MG/3ML IN SOLN
RESPIRATORY_TRACT | Status: AC
  Filled 2022-11-02: qty 3

## 2022-11-02 MED ORDER — ALBUTEROL SULFATE HFA 108 (90 BASE) MCG/ACT IN AERS: 1.0000 | INHALATION_SPRAY | Freq: Four times a day (QID) | RESPIRATORY_TRACT | 0 refills | Status: DC | PRN

## 2022-11-02 MED ORDER — METHYLPREDNISOLONE ACETATE 40 MG/ML IJ SUSP
INTRAMUSCULAR | Status: AC
  Filled 2022-11-02: qty 1

## 2022-11-02 NOTE — ED Provider Notes (Signed)
[facility]    CSN: [id number] Arrival date & time: 11/02/22  1643      History   Chief Complaint Chief Complaint  Patient presents with   Back Pain    HPI Noris Hohman is a 62 y.o. male.   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  Patient declines medical interpreter services today.  Reports 4-day history of pain all over, cough, shortness of breath, and chest pain.  Reports he is having pain in his entire back, bilateral flanks, abdomen.  Reports he is having difficulty breathing today.  No nausea/vomiting, diarrhea.  No change in appetite.  Patient is requesting a note for work today.  Patient's family member reports that he smokes cigarettes daily.  He is working on cutting back/quitting.  Does not take any daily medications.    History reviewed. No pertinent past medical history.  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 No pertinent surgical history.     Home Medications    Prior to Admission medications   Medication Sig Start Date End Date Taking? Authorizing Provider  albuterol (VENTOLIN HFA) 108 (90 Base) MCG/ACT inhaler Inhale 1-2 puffs into the lungs every 6 (six) hours as needed for wheezing or shortness of breath. 11/02/22  Yes Valentino Nose, NP  azithromycin (ZITHROMAX) 250 MG tablet Take (2) tablets by mouth on day 1, then take (1) tablet by mouth on days 2-5. 11/02/22  Yes Cathlean Marseilles A, NP  diclofenac Sodium (VOLTAREN) 1 % GEL Apply topically 4 (four) times daily.   Yes [provider]  b complex vitamins capsule Take 1 capsule by mouth daily. 01/11/20   Johney Maine, MD  benzonatate (TESSALON) 100 MG capsule Take 1-2 capsules  (100-200 mg total) by mouth 3 (three) times daily as needed for cough. 11/02/22   Valentino Nose, NP  Cyanocobalamin (B-12) 1000 MCG SUBL Place 1,000 mcg under the tongue daily. 01/11/20   Johney Maine, MD  cyclobenzaprine (FLEXERIL) 10 MG tablet Take 1 tablet (10 mg total) by mouth at bedtime as needed for muscle spasms. 01/29/20   Particia Nearing, PA-C  gabapentin (NEURONTIN) 100 MG capsule Take 1 capsule (100 mg total) by mouth at bedtime. 02/14/20   Sandre Kitty, MD  HYDROcodone-acetaminophen (NORCO/VICODIN) 5-325 MG tablet Take 1 tablet by mouth every 4 (four) hours as needed. 02/12/20   Garlon Hatchet, PA-C  meloxicam (MOBIC) 15 MG tablet Take 1 tablet (15 mg total) by mouth daily. 02/14/20   Sandre Kitty, MD  naproxen (NAPROSYN) 375 MG tablet Take 1 tablet (375 mg total) by mouth 2 (two) times daily with a meal. 08/13/20   Wallis Bamberg, PA-C  NON FORMULARY Speaks of a cream he is using on painful areas that is not working    [provider]  predniSONE (DELTASONE) 20 MG tablet Take 2 tablets (40 mg total) by mouth daily with breakfast for 5 days. 11/02/22 11/07/22  Valentino Nose, NP  tiZANidine (ZANAFLEX) 4 MG tablet Take 1 tablet (4 mg total) by mouth every 8 (eight) hours as needed. 08/13/20   Wallis Bamberg, PA-C    Family History Family History  Family history unknown: Yes    Social History Social  History   Tobacco Use   Smoking status: Every Day    Types: Cigars   Smokeless tobacco: Never  Substance Use Topics   Alcohol use: Never   Drug use: Never     Allergies   Patient has no known allergies.   Review of Systems Review of Systems Per HPI  Physical Exam Triage Vital Signs ED Triage Vitals  Encounter Vitals Group     BP 11/02/22 1754 (!) 166/96     Systolic BP Percentile --      Diastolic BP Percentile --      Pulse Rate 11/02/22 1754 (!) 115     Resp 11/02/22 1754 (!) 30     Temp 11/02/22 1754 97.7 F (36.5 C)     Temp src --       SpO2 11/02/22 1754 (!) 86 %     Weight --      Height --      Head Circumference --      Peak Flow --      Pain Score 11/02/22 1751 9     Pain Loc --      Pain Education --      Exclude from Growth Chart --    No data found.  Updated Vital Signs BP (!) 166/96   Pulse (!) 104   Temp 97.7 F (36.5 C)   Resp (!) 30   SpO2 100%   Respiratory rate after DuoNeb: 22  Visual Acuity Right Eye Distance:   Left Eye Distance:   Bilateral Distance:    Right Eye Near:   Left Eye Near:    Bilateral Near:     Physical Exam Vitals and nursing note reviewed.  Constitutional:      General: He is not in acute distress.    Appearance: Normal appearance. He is not ill-appearing or toxic-appearing.  HENT:     Head: Normocephalic and atraumatic.     Right Ear: Tympanic membrane, ear canal and external ear normal.     Left Ear: Tympanic membrane, ear canal and external ear normal.     Nose: Congestion and rhinorrhea present.     Mouth/Throat:     Mouth: Mucous membranes are moist.     Pharynx: Oropharynx is clear. Posterior oropharyngeal erythema present. No oropharyngeal exudate.  Cardiovascular:     Rate and Rhythm: Normal rate and regular rhythm.  Pulmonary:     Effort: Pulmonary effort is normal. No respiratory distress.     Breath sounds: Wheezing and rhonchi present. No rales.  Musculoskeletal:     Cervical back: Normal range of motion and neck supple.  Lymphadenopathy:     Cervical: No cervical adenopathy.  Skin:    General: Skin is warm and dry.     Coloration: Skin is not jaundiced or pale.     Findings: No erythema or rash.  Neurological:     Mental Status: He is alert and oriented to person, place, and time.  Psychiatric:        Behavior: Behavior is cooperative.      UC Treatments / Results  Labs (all labs ordered are listed, but only abnormal results are displayed) Labs Reviewed - No data to display  EKG   Radiology No results  found.  Procedures Procedures (including critical care time)  Medications Ordered in UC Medications  methylPREDNISolone acetate (DEPO-MEDROL) injection 40 mg (40 mg Intramuscular Given 11/02/22 1807)  ipratropium-albuterol (DUONEB) 0.5-2.5 (3) MG/3ML nebulizer solution 3 mL (3 mLs Nebulization Given  11/02/22 1807)    Initial Impression / Assessment and Plan / UC Course  I have reviewed the triage vital signs and the nursing notes.  Pertinent labs & imaging results that were available during my care of the patient were reviewed by me and considered in my medical decision making (see chart for details).   Initially in triage, patient is ill-appearing, hypertensive, tachypneic, SpO2 was 86% on room air, and he is a tachycardic.  After breathing treatment and IM Depo-Medrol, patient reports "I am all better."  SpO2 100% on room air, patient still slightly tachycardic but somewhat improved.  1. Chronic bronchitis with acute exacerbation (HCC) Given smoking history, patient likely has underlying chronic bronchitis Given length of symptoms, COVID-19 testing deferred Patient reports significant improvement with DuoNeb and Depo-Medrol in urgent care today Start albuterol inhaler inhaler at home, oral prednisone, azithromycin Strict ER and return precautions discussed with patient and family member Work excuse given Recommended follow-up in 48 hours for recheck or sooner in ER symptoms worsen  The patient was given the opportunity to ask questions.  All questions answered to their satisfaction.  The patient is in agreement to this plan.    Final Clinical Impressions(s) / UC Diagnoses   Final diagnoses:  Chronic bronchitis with acute exacerbation [facility])     Discharge Instructions      We have given you a breathing treatment and a shot of steroid medicine today.  Continue albuterol inhaler at home every 4-6 hours as needed for wheezing or chest tightness.  Start taking the prednisone  tomorrow morning and start the azithromycin and take as prescribed.  Please follow-up with [place] or PCP in 48 hours for recheck.  If symptoms worsen in the meantime, please be evaluated in the emergency room.    ED Prescriptions     Medication Sig Dispense Auth. Provider   benzonatate (TESSALON) 100 MG capsule Take 1-2 capsules (100-200 mg total) by mouth 3 (three) times daily as needed for cough. 30 capsule Cathlean Marseilles A, NP   predniSONE (DELTASONE) 20 MG tablet Take 2 tablets (40 mg total) by mouth daily with breakfast for 5 days. 10 tablet Cathlean Marseilles A, NP   albuterol (VENTOLIN HFA) 108 (90 Base) MCG/ACT inhaler Inhale 1-2 puffs into the lungs every 6 (six) hours as needed for wheezing or shortness of breath. 18 g Cathlean Marseilles A, NP   azithromycin (ZITHROMAX) 250 MG tablet Take (2) tablets by mouth on day 1, then take (1) tablet by mouth on days 2-5. 6 tablet Valentino Nose, NP      PDMP not reviewed this encounter.   Valentino Nose, NP 11/02/22 1901

## 2022-11-02 NOTE — Discharge Instructions (Addendum)
We have given you a breathing treatment and a shot of steroid medicine today.  Continue albuterol inhaler at home every 4-6 hours as needed for wheezing or chest tightness.  Start taking the prednisone tomorrow morning and start the azithromycin and take as prescribed.  Please follow-up with [place] or PCP in 48 hours for recheck.  If symptoms worsen in the meantime, please be evaluated in the emergency room.

## 2022-11-02 NOTE — ED Triage Notes (Signed)
Pt presents with complaints of entire back pain, bilateral flank pain, abdominal pain, chest pain and bilateral leg pain x 4 days. Pt also states he feels like he is not breathing well.

## 2022-11-05 ENCOUNTER — Encounter (HOSPITAL_COMMUNITY): Payer: Self-pay

## 2022-11-05 ENCOUNTER — Ambulatory Visit (INDEPENDENT_AMBULATORY_CARE_PROVIDER_SITE_OTHER): Payer: BC Managed Care – PPO

## 2022-11-05 ENCOUNTER — Ambulatory Visit (HOSPITAL_COMMUNITY)
Admission: EM | Admit: 2022-11-05 | Discharge: 2022-11-05 | Disposition: A | Discharge status: Home / Self Care | Payer: BC Managed Care – PPO | Attending: Family Medicine | Admitting: Family Medicine

## 2022-11-05 DIAGNOSIS — R0602 Shortness of breath: Secondary | ICD-10-CM

## 2022-11-05 DIAGNOSIS — R5383 Other fatigue: Secondary | ICD-10-CM | POA: Diagnosis not present

## 2022-11-05 DIAGNOSIS — R030 Elevated blood-pressure reading, without diagnosis of hypertension: Secondary | ICD-10-CM | POA: Diagnosis not present

## 2022-11-05 LAB — COMPREHENSIVE METABOLIC PANEL
ALT: 18 U/L (ref 0–44)
AST: 32 U/L (ref 15–41)
Albumin: 2.9 g/dL — ABNORMAL LOW (ref 3.5–5.0)
Alkaline Phosphatase: 560 U/L — ABNORMAL HIGH (ref 38–126)
Anion gap: 10 (ref 5–15)
BUN: 19 mg/dL (ref 8–23)
CO2: 26 mmol/L (ref 22–32)
Calcium: 8.4 mg/dL — ABNORMAL LOW (ref 8.9–10.3)
Chloride: 99 mmol/L (ref 98–111)
Creatinine, Ser: 0.81 mg/dL (ref 0.61–1.24)
GFR, Estimated: >60 mL/min (ref >60)
Glucose, Bld: 95 mg/dL (ref 70–99)
Potassium: 4.5 mmol/L (ref 3.5–5.1)
Sodium: 135 mmol/L (ref 135–145)
Total Bilirubin: 0.3 mg/dL (ref 0.3–1.2)
Total Protein: 10.2 g/dL — ABNORMAL HIGH (ref 6.5–8.1)

## 2022-11-05 LAB — CBC WITH DIFFERENTIAL/PLATELET
Abs Immature Granulocytes: 0.14 10*3/uL — ABNORMAL HIGH (ref 0.00–0.07)
Basophils Absolute: 0.1 10*3/uL (ref 0.0–0.1)
Basophils Relative: 1 %
Eosinophils Absolute: 0 10*3/uL (ref 0.0–0.5)
Eosinophils Relative: 0 %
HCT: 47.8 % (ref 39.0–52.0)
Hemoglobin: 14.8 g/dL (ref 13.0–17.0)
Immature Granulocytes: 1 %
Lymphocytes Relative: 43 %
Lymphs Abs: 4.4 10*3/uL — ABNORMAL HIGH (ref 0.7–4.0)
MCH: 27.1 pg (ref 26.0–34.0)
MCHC: 31 g/dL (ref 30.0–36.0)
MCV: 87.5 fL (ref 80.0–100.0)
Monocytes Absolute: 0.5 10*3/uL (ref 0.1–1.0)
Monocytes Relative: 5 %
Neutro Abs: 5 10*3/uL (ref 1.7–7.7)
Neutrophils Relative %: 50 %
Platelets: 167 10*3/uL (ref 150–400)
RBC: 5.46 MIL/uL (ref 4.22–5.81)
RDW: 14.6 % (ref 11.5–15.5)
WBC: 10.2 10*3/uL (ref 4.0–10.5)
nRBC: 0 % (ref 0.0–0.2)

## 2022-11-05 LAB — TSH: TSH: 1.855 u[IU]/mL (ref 0.350–4.500)

## 2022-11-05 NOTE — ED Triage Notes (Signed)
Patient was seen on 11/02/22 and was diagnosed with acute bronchitis. Patient is here for a follow up.  Patient's friend is interpreting per patient's request.   Patient states he "still feels sick and not feeling well." Patient states he "still feels temperature coming in him. Sometimes."

## 2022-11-05 NOTE — ED Provider Notes (Signed)
[facility]   [id number] 11/05/22 Arrival Time: 1521  ASSESSMENT & PLAN:  1. Other fatigue   2. SOB (shortness of breath)   3. Elevated blood pressure reading without diagnosis of hypertension    I have personally viewed and independently interpreted the imaging studies ordered this visit. No acute changes on CXR. No PNA.  Pending:  CBC WITH DIFFERENTIAL/PLATELET  COMPREHENSIVE METABOLIC PANEL  TSH   Agrees to ED evaluation should he feel worse in any way. Unclear of cause of his fatigue. Work note for 3 days given. Works Careers information officer. Stressed importance of PCP f/u. Recommend:  Follow-up Information     Schedule an appointment as soon as possible for a visit  with Glendale Chard, DO.   Specialty: Family Medicine Why: For follow up. Contact information: [address] [place] [address] [phone]                 Reviewed expectations re: course of current medical issues. Questions answered. Outlined signs and symptoms indicating need for more acute intervention. Patient verbalized understanding. After Visit Summary given.   SUBJECTIVE: History from: patient. Family member is here to interpret. Declines interpreter. Arthur Ali is a 62 y.o. male who presents with complaint of fatigue. Weeks to months. Recently seen and treated for bronchitis with antibiotic and steroids. Feels a little better. Vague history. Keeps mentioning "being tired a lot". Sleeps through the night. Works a Occupational psychologist. Wonders if a few days off work would help. Normal bowel/bladder habits. No new medications. Denies CP/LE edema. Denies abd pain. Normal PO intake without n/v/d. Denies wt loss.  OBJECTIVE:  Vitals:   11/05/22 1606 11/05/22 1610  BP: (!) 165/101   Pulse: 99   Resp: 18   Temp: 97.9 F (36.6 C)   TempSrc: Oral   SpO2: 94% 94%    General appearance: alert; no distress but appears fatigued Eyes: PERRLA; EOMI; conjunctiva normal HENT: normocephalic;  atraumatic Neck: supple with FROM Lungs: coarse breath sounds bilaterally; unlabored Heart: regular Abdomen: soft, non-tender; bowel sounds normal; no masses or organomegaly; no guarding or rebound tenderness Back: no CVA tenderness Extremities: no edema; symmetrical with no gross deformities Skin: warm and dry Neurologic: normal gait; normal symmetric reflexes Psychological: alert and cooperative; normal mood and affect  Labs: Labs Reviewed  CBC WITH DIFFERENTIAL/PLATELET  COMPREHENSIVE METABOLIC PANEL  TSH   Imaging: DG Chest 2 View  Result Date: 11/05/2022 CLINICAL DATA:  Shortness of breath. EXAM: CHEST - 2 VIEW COMPARISON:  02/12/2020 FINDINGS: Lungs are hyperexpanded. The lungs are clear without focal pneumonia, edema, pneumothorax or pleural effusion. The cardiopericardial silhouette is within normal limits for size. No acute bony abnormality. IMPRESSION: Hyperexpansion without acute cardiopulmonary findings. Electronically Signed   By: Kennith Center M.D.   On: 11/05/2022 17:42      No Known Allergies  History reviewed. No pertinent past medical history. Social History   Socioeconomic History   Marital status: Married    Spouse name: Not on file   Number of children: Not on file   Years of education: Not on file   Highest education level: Not on file  Occupational History   Not on file  Tobacco Use   Smoking status: Every Day    Types: Cigars   Smokeless tobacco: Never  Vaping Use   Vaping status: Never Used  Substance and Sexual Activity   Alcohol use: Never   Drug use: Never   Sexual activity: Not Currently  Other Topics  Concern   Not on file  Social History Narrative   Single applicant from [place]   No family here or in [place]      Refugee Information   Number of Immediate Family Members: 0   Number of Immediate Family Members in [place]: 0   Country of Birth: [place] [facility]   Country of Origin: [facility]   Location of Refugee Camp: [place]   Duration in [place]: 20 years or  greater   Reason for Leaving Home Country: Political opinion   Primary Language: Swahili/Kiswahili, Kinyar[place]anda/[place]   Able to Read in Primary Language: Yes   Able to Write in Primary Language: Yes   Education: Primary School   Prior Work: No previous jobs   Marital Status: Other (Wife and children decreased)   Sexual Activity: No   Tuberculosis Screening Overseas: Positive   Tuberculosis Screening Health Department: Not Completed   Health Department Labs Completed: Yes   History of Trauma: None   Do You Feel Jumpy or Nervous?: No   Are You Very Watchful or 'Super Alert'?: No   Social Determinants of Health   Financial Resource Strain: Not on file  Food Insecurity: Not on file  Transportation Needs: Not on file  Physical Activity: Not on file  Stress: Not on file  Social Connections: Not on file  Intimate Partner Violence: Not on file   Family History  Family history unknown: Yes   History reviewed. No pertinent surgical history.   Mardella Layman, MD 11/05/22 [phone]

## 2022-11-05 NOTE — Discharge Instructions (Signed)
You have had labs (blood tests) sent today. We will call you with any significant abnormalities or if there is need to begin or change treatment or pursue further follow up.  You may also review your test results online through MyChart. If you do not have a MyChart account, instructions to sign up should be on your discharge paperwork.  

## 2022-11-19 ENCOUNTER — Encounter (HOSPITAL_COMMUNITY): Payer: Self-pay | Admitting: Family Medicine

## 2022-11-19 ENCOUNTER — Ambulatory Visit: Payer: BC Managed Care – PPO | Admitting: Family Medicine

## 2022-11-19 ENCOUNTER — Inpatient Hospital Stay (HOSPITAL_COMMUNITY): Payer: BC Managed Care – PPO

## 2022-11-19 ENCOUNTER — Other Ambulatory Visit: Payer: Self-pay

## 2022-11-19 ENCOUNTER — Encounter: Payer: Self-pay | Admitting: Family Medicine

## 2022-11-19 ENCOUNTER — Encounter (HOSPITAL_COMMUNITY): Payer: Self-pay

## 2022-11-19 ENCOUNTER — Inpatient Hospital Stay (HOSPITAL_COMMUNITY)
Admission: AD | Admit: 2022-11-19 | Discharge: 2022-11-26 | DRG: 715 | Disposition: A | Discharge status: Home Health | Payer: BC Managed Care – PPO | Source: Ambulatory Visit | Attending: Family Medicine | Admitting: Family Medicine

## 2022-11-19 VITALS — BP 141/89 | HR 119 | Ht 69.0 in | Wt 112.8 lb

## 2022-11-19 DIAGNOSIS — D72820 Lymphocytosis (symptomatic): Secondary | ICD-10-CM | POA: Diagnosis not present

## 2022-11-19 DIAGNOSIS — E875 Hyperkalemia: Secondary | ICD-10-CM | POA: Diagnosis present

## 2022-11-19 DIAGNOSIS — R0602 Shortness of breath: Secondary | ICD-10-CM | POA: Diagnosis not present

## 2022-11-19 DIAGNOSIS — C61 Malignant neoplasm of prostate: Principal | ICD-10-CM | POA: Insufficient documentation

## 2022-11-19 DIAGNOSIS — Z515 Encounter for palliative care: Secondary | ICD-10-CM | POA: Diagnosis not present

## 2022-11-19 DIAGNOSIS — R64 Cachexia: Secondary | ICD-10-CM | POA: Diagnosis present

## 2022-11-19 DIAGNOSIS — N1330 Unspecified hydronephrosis: Secondary | ICD-10-CM | POA: Diagnosis present

## 2022-11-19 DIAGNOSIS — Z87891 Personal history of nicotine dependence: Secondary | ICD-10-CM

## 2022-11-19 DIAGNOSIS — R509 Fever, unspecified: Secondary | ICD-10-CM | POA: Diagnosis present

## 2022-11-19 DIAGNOSIS — E43 Unspecified severe protein-calorie malnutrition: Secondary | ICD-10-CM | POA: Diagnosis present

## 2022-11-19 DIAGNOSIS — I517 Cardiomegaly: Secondary | ICD-10-CM | POA: Diagnosis not present

## 2022-11-19 DIAGNOSIS — Z7189 Other specified counseling: Secondary | ICD-10-CM | POA: Diagnosis not present

## 2022-11-19 DIAGNOSIS — R627 Adult failure to thrive: Secondary | ICD-10-CM | POA: Diagnosis present

## 2022-11-19 DIAGNOSIS — R000 Tachycardia, unspecified: Secondary | ICD-10-CM

## 2022-11-19 DIAGNOSIS — R918 Other nonspecific abnormal finding of lung field: Secondary | ICD-10-CM | POA: Diagnosis not present

## 2022-11-19 DIAGNOSIS — R634 Abnormal weight loss: Secondary | ICD-10-CM | POA: Diagnosis not present

## 2022-11-19 DIAGNOSIS — Z91199 Patient's noncompliance with other medical treatment and regimen due to unspecified reason: Secondary | ICD-10-CM

## 2022-11-19 DIAGNOSIS — R748 Abnormal levels of other serum enzymes: Secondary | ICD-10-CM | POA: Diagnosis present

## 2022-11-19 DIAGNOSIS — G893 Neoplasm related pain (acute) (chronic): Secondary | ICD-10-CM

## 2022-11-19 DIAGNOSIS — R0982 Postnasal drip: Secondary | ICD-10-CM | POA: Diagnosis present

## 2022-11-19 DIAGNOSIS — I700 Atherosclerosis of aorta: Secondary | ICD-10-CM | POA: Diagnosis present

## 2022-11-19 DIAGNOSIS — K5900 Constipation, unspecified: Secondary | ICD-10-CM | POA: Diagnosis not present

## 2022-11-19 DIAGNOSIS — I2699 Other pulmonary embolism without acute cor pulmonale: Secondary | ICD-10-CM | POA: Diagnosis present

## 2022-11-19 DIAGNOSIS — D649 Anemia, unspecified: Secondary | ICD-10-CM | POA: Diagnosis not present

## 2022-11-19 DIAGNOSIS — Z597 Insufficient social insurance and welfare support: Secondary | ICD-10-CM

## 2022-11-19 DIAGNOSIS — E538 Deficiency of other specified B group vitamins: Secondary | ICD-10-CM | POA: Diagnosis present

## 2022-11-19 DIAGNOSIS — Z139 Encounter for screening, unspecified: Secondary | ICD-10-CM

## 2022-11-19 DIAGNOSIS — I10 Essential (primary) hypertension: Secondary | ICD-10-CM | POA: Diagnosis present

## 2022-11-19 DIAGNOSIS — Z5982 Transportation insecurity: Secondary | ICD-10-CM | POA: Diagnosis not present

## 2022-11-19 DIAGNOSIS — Z556 Problems related to health literacy: Secondary | ICD-10-CM

## 2022-11-19 DIAGNOSIS — M899 Disorder of bone, unspecified: Secondary | ICD-10-CM | POA: Diagnosis not present

## 2022-11-19 DIAGNOSIS — D630 Anemia in neoplastic disease: Secondary | ICD-10-CM | POA: Diagnosis present

## 2022-11-19 DIAGNOSIS — I272 Pulmonary hypertension, unspecified: Secondary | ICD-10-CM | POA: Diagnosis present

## 2022-11-19 DIAGNOSIS — Z681 Body mass index (BMI) 19 or less, adult: Secondary | ICD-10-CM

## 2022-11-19 DIAGNOSIS — Z5986 Financial insecurity: Secondary | ICD-10-CM

## 2022-11-19 DIAGNOSIS — M545 Low back pain, unspecified: Secondary | ICD-10-CM | POA: Diagnosis not present

## 2022-11-19 DIAGNOSIS — R972 Elevated prostate specific antigen [PSA]: Secondary | ICD-10-CM | POA: Diagnosis not present

## 2022-11-19 DIAGNOSIS — E871 Hypo-osmolality and hyponatremia: Secondary | ICD-10-CM | POA: Diagnosis present

## 2022-11-19 DIAGNOSIS — C7951 Secondary malignant neoplasm of bone: Secondary | ICD-10-CM | POA: Diagnosis present

## 2022-11-19 DIAGNOSIS — C7800 Secondary malignant neoplasm of unspecified lung: Secondary | ICD-10-CM | POA: Diagnosis present

## 2022-11-19 DIAGNOSIS — C7989 Secondary malignant neoplasm of other specified sites: Secondary | ICD-10-CM | POA: Diagnosis not present

## 2022-11-19 DIAGNOSIS — R54 Age-related physical debility: Secondary | ICD-10-CM | POA: Diagnosis present

## 2022-11-19 DIAGNOSIS — Z603 Acculturation difficulty: Secondary | ICD-10-CM | POA: Diagnosis present

## 2022-11-19 HISTORY — DX: Essential (primary) hypertension: I10

## 2022-11-19 HISTORY — DX: Dorsalgia, unspecified: M54.9

## 2022-11-19 LAB — CBC WITH DIFFERENTIAL/PLATELET
Abs Immature Granulocytes: 0.08 10*3/uL — ABNORMAL HIGH (ref 0.00–0.07)
Basophils Absolute: 0 10*3/uL (ref 0.0–0.1)
Basophils Relative: 0 %
Eosinophils Absolute: 0 10*3/uL (ref 0.0–0.5)
Eosinophils Relative: 0 %
HCT: 35.9 % — ABNORMAL LOW (ref 39.0–52.0)
Hemoglobin: 11.6 g/dL — ABNORMAL LOW (ref 13.0–17.0)
Immature Granulocytes: 1 %
Lymphocytes Relative: 37 %
Lymphs Abs: 3.7 10*3/uL (ref 0.7–4.0)
MCH: 28 pg (ref 26.0–34.0)
MCHC: 32.3 g/dL (ref 30.0–36.0)
MCV: 86.7 fL (ref 80.0–100.0)
Monocytes Absolute: 0.3 10*3/uL (ref 0.1–1.0)
Monocytes Relative: 3 %
Neutro Abs: 5.6 10*3/uL (ref 1.7–7.7)
Neutrophils Relative %: 59 %
Platelets: 196 10*3/uL (ref 150–400)
RBC: 4.14 MIL/uL — ABNORMAL LOW (ref 4.22–5.81)
RDW: 13.7 % (ref 11.5–15.5)
WBC: 9.8 10*3/uL (ref 4.0–10.5)
nRBC: 0.3 % — ABNORMAL HIGH (ref 0.0–0.2)

## 2022-11-19 LAB — COMPREHENSIVE METABOLIC PANEL
ALT: 21 U/L (ref 0–44)
AST: 26 U/L (ref 15–41)
Albumin: 2 g/dL — ABNORMAL LOW (ref 3.5–5.0)
Alkaline Phosphatase: 503 U/L — ABNORMAL HIGH (ref 38–126)
Anion gap: 14 (ref 5–15)
BUN: 13 mg/dL (ref 8–23)
CO2: 26 mmol/L (ref 22–32)
Calcium: 8.6 mg/dL — ABNORMAL LOW (ref 8.9–10.3)
Chloride: 98 mmol/L (ref 98–111)
Creatinine, Ser: 0.75 mg/dL (ref 0.61–1.24)
GFR, Estimated: >60 mL/min (ref >60)
Glucose, Bld: 121 mg/dL — ABNORMAL HIGH (ref 70–99)
Potassium: 4.3 mmol/L (ref 3.5–5.1)
Sodium: 138 mmol/L (ref 135–145)
Total Bilirubin: 0.4 mg/dL (ref 0.3–1.2)
Total Protein: 7.9 g/dL (ref 6.5–8.1)

## 2022-11-19 LAB — PROTIME-INR
INR: 1.2 (ref 0.8–1.2)
Prothrombin Time: 15 s (ref 11.4–15.2)

## 2022-11-19 LAB — APTT: aPTT: 31 s (ref 24–36)

## 2022-11-19 LAB — HIV ANTIBODY (ROUTINE TESTING W REFLEX): HIV Screen 4th Generation wRfx: NONREACTIVE

## 2022-11-19 LAB — MAGNESIUM: Magnesium: 1.8 mg/dL (ref 1.7–2.4)

## 2022-11-19 LAB — PHOSPHORUS: Phosphorus: 3.1 mg/dL (ref 2.5–4.6)

## 2022-11-19 LAB — TROPONIN I (HIGH SENSITIVITY): Troponin I (High Sensitivity): 9 ng/L (ref <18)

## 2022-11-19 MED ORDER — IOHEXOL 350 MG/ML SOLN
60.0000 mL | Freq: Once | INTRAVENOUS | Status: AC | PRN
  Administered 2022-11-19: 60 mL via INTRAVENOUS

## 2022-11-19 MED ORDER — ENOXAPARIN SODIUM 40 MG/0.4ML IJ SOSY
40.0000 mg | PREFILLED_SYRINGE | INTRAMUSCULAR | Status: DC
  Administered 2022-11-19 – 2022-11-22 (×4): 40 mg via SUBCUTANEOUS
  Filled 2022-11-19 (×4): qty 0.4

## 2022-11-19 MED ORDER — LACTATED RINGERS IV BOLUS
500.0000 mL | Freq: Once | INTRAVENOUS | Status: AC
  Administered 2022-11-19: 500 mL via INTRAVENOUS

## 2022-11-19 MED ORDER — ACETAMINOPHEN 650 MG RE SUPP: 650.0000 mg | Freq: Four times a day (QID) | RECTAL | Status: DC | PRN

## 2022-11-19 MED ORDER — ACETAMINOPHEN 325 MG PO TABS: 650.0000 mg | ORAL_TABLET | Freq: Four times a day (QID) | ORAL | Status: DC | PRN

## 2022-11-19 MED ORDER — ENSURE ENLIVE PO LIQD
237.0000 mL | Freq: Two times a day (BID) | ORAL | Status: DC
  Administered 2022-11-20 – 2022-11-22 (×5): 237 mL via ORAL

## 2022-11-19 MED ORDER — INFLUENZA VIRUS VACC SPLIT PF (FLUZONE) 0.5 ML IM SUSY: 0.5000 mL | PREFILLED_SYRINGE | INTRAMUSCULAR | Status: DC | PRN

## 2022-11-19 MED ORDER — LIDOCAINE 5 % EX PTCH
1.0000 | MEDICATED_PATCH | CUTANEOUS | Status: DC
  Administered 2022-11-19 – 2022-11-25 (×7): 1 via TRANSDERMAL
  Filled 2022-11-19 (×7): qty 1

## 2022-11-19 NOTE — Hospital Course (Addendum)
Arthur Ali is a 62 y.o.male with a history of lymphocytosis who was admitted to the [facility] Teaching Service at [facility] for dyspnea, weight loss. His hospital course is detailed below:  Weight Loss  Dyspnea  Patient present to the [facility] with worsening dyspnea and weight loss in the setting of elevated serum protein previously with concern for MM, was lost to follow up for a period of time. Patient was directly admitted to service for his symptoms and concern of malignancy. CT showed ***   Other chronic conditions were medically managed with home medications and formulary alternatives as necessary (***)  PCP Follow-up Recommendations:

## 2022-11-19 NOTE — Assessment & Plan Note (Deleted)
Cachetic male with loss of appetite and unintentional weight loss.  - Regular diet and Ensure - Follow with labs

## 2022-11-19 NOTE — Plan of Care (Signed)
Problem: Education: Goal: Knowledge of General Education information will improve Description: Including pain rating scale, medication(s)/side effects and non-pharmacologic comfort measures Outcome: Progressing   Problem: Clinical Measurements: Goal: Will remain free from infection Outcome: Progressing   Problem: Coping: Goal: Level of anxiety will decrease Outcome: Progressing   Problem: Elimination: Goal: Will not experience complications related to urinary retention Outcome: Progressing   Problem: Pain Managment: Goal: General experience of comfort will improve Outcome: Progressing   Problem: Safety: Goal: Ability to remain free from injury will improve Outcome: Progressing   Problem: Skin Integrity: Goal: Risk for impaired skin integrity will decrease Outcome: Progressing

## 2022-11-19 NOTE — Assessment & Plan Note (Addendum)
2 years of B symptoms which worsened over the past month. Previous multiple myeloma panel in 2021 with elevated IgG and IgM, elevated total protein 9.6>10.2, and elevated alkaline phosphatase 560, after which he was lost to follow up. Concern for B-cell pathology vs lung cancer, here for further workup due to deconditioning.  - Admit to [facility]MTS, attending Dr. Manson Passey - Med-Tele, Vital signs per floor - Regular diet and Ensure - PT/OT to treat - VTE prophylaxis: Lovenox - Labs: CBC w/diff, CMP, HIV, Mag, Phos, PT/INR, PTT, Quantiferon-Gold, Troponin, Phosphorus - Imaging: CTPE and Lumbar XR - Multiple myeloma panel - EKG pending - Heme/Onc consulted, appreciate recs - Pain: Tylenol and Lidoderm patch - AM CBC/CMP, Mag, Phos

## 2022-11-19 NOTE — Progress Notes (Signed)
FMTS Attending Admission Note: Terisa Starr, MD   For questions about this patient, please use [email or web address] to page the family medicine resident on call. Pager number [phone].    I  have personally seen and examined this patient, reviewed their chart. I have discussed this patient with the resident. I will sign the resident note as it is available.  Patient interviewed with live interpreter, Sullivan Lone.   This patient is a very pleasant 62 year old gentleman with history significant for chronic back pain and abnormal labs suggestive of a possible underlying monoclonal B-cell process presenting with weight loss, fatigue, subjective fevers, dyspnea and overall feeling unwell.  He has been seen in urgent care for these symptoms.  Labs demonstrated a markedly elevated alkaline phosphatase as well as elevated total protein.  Chest x-ray showed hyperexpansion of the lungs consistent with possible COPD.  He presents today for follow-up for his above symptoms. He reports he lives alone.  He is continue to work in textile's.  He is a refugee from the [place] of [place].  He speaks cantor 1 and best but does speak some Swahili. On his initial evaluation he was referred to hematology who completed flow cytometry.  Follow-up myeloma panel was ordered but he did not follow-up to discuss this.  He has been lost to follow-up since that time.  Medical history is reviewed and notable mostly for back pain.  No surgical history of note.  Both of his parents are deceased.  He is a former smoker for many many years.  He is not married he has no family members in the [place].  Vitals are reviewed and notable for tachycardia and mild hypertension.  On exam he is cachectic and ill-appearing.  He is speaking in full sentences although tired.  HEENT exam is notable for wasting of the parotid area.  Oropharynx there is some postnasal drainage without masses.  Dentition is poor.  Neck exam is notable for  significant supraclavicular lymphadenopathy.  Cardiac exam is tachycardic without a murmur.  Lungs are diminished bilaterally with significantly decreased breath sounds at the left upper lobe as compared to the right upper lobe.  Abdomen is very thin with tenderness to palpation in the right upper quadrant with mild hepatomegaly.  There is no lower extremity edema.     Unintentional weight loss, highly concerning for malignancy.  Differential includes myeloma or primary lung cancer.  Other etiologies are also possible.  Will directly admit.  Monitor on telemetry.  Obtain CBC, CMP and would repeat the myeloma panel.  I also recommended a CT chest to further evaluate for possible underlying lung cancer pending his creatinine. Also consider repeat quantiferon gold.  Elevated blood pressure would monitor and consider starting therapy. Tachycardia, possibly due to underlying significant malnutrition as well as malignancy.  Pulmonary embolism must be considered.  Obtain EKG on admission, chest x-ray and consider CT as above with a evaluation for pulmonary embolism with contrast pending creatinine. Back pain possibly secondary to metastasis or underlying monoclonal gammopathy.  Recommend Tylenol, Lidoderm patches and lumbar spine imaging with x-ray to start.  He may require some low-dose narcotics for this.  Would avoid NSAIDs and the setting of getting contrast. Language barrier affecting care please engage social work as the patient would be eligible for Medicaid.  He does not appear he is active insurance right now.  The patient has significant transportation barriers. Social stressors, in addition to social work consult would benefit from connecting with conversational nurse Nicole Cella  Muhoro.  I have included her in a secure chat about this.  Will sign resident note as able.

## 2022-11-19 NOTE — Assessment & Plan Note (Addendum)
Likely secondary to his symptoms and lack of appetite/malnutrition. Presenting with CP and SOB. - CTPE to rule out PE

## 2022-11-19 NOTE — Progress Notes (Signed)
    SUBJECTIVE:   CHIEF COMPLAINT / HPI:   UC follow up Was seen 8/26 and 8/29 for back pain, dyspnea, fatigue.  He was noted to be hypertensive and borderline tachycardic at both appointments.  He was treated for chronic bronchitis with exacerbation with steroids, antibiotics.  He also obtained labs with elevated alkaline phosphatase, elevated serum protein, elevated absolute lymphocytes, and normal TSH.  Today, he feels a little better but not back to normal. He is having chest pain, back pain (though chronic), hip pain, nonproductive cough. Has been going on for 4 weeks. Has felt feverish. Had some night sweats on his pillow. Also sweats during the day. Less appetite. Has lost weight recently. No personal history of cancer. No family history of cancer that he knows of. No sick contacts. Used to smoke for around 45 years but quit 2 years ago. No difficulty urinating or stooling.  PERTINENT  PMH / PSH: lymphocytosis lost to follow-up with heme-onc  OBJECTIVE:   BP (!) 141/89   Pulse (!) 119   Ht 5\' 9"  (1.753 m)   Wt 112 lb 12.8 oz (51.2 kg)   SpO2 95%   BMI 16.66 kg/m   General: Alert and oriented, in NAD, wearing ABD and sweatshirt, cachectic appearing Skin: Warm, dry, and intact without lesions HEENT: NCAT, EOM grossly normal, midline nasal septum Cardiac: Tachycardic, regular rhythm, no m/r/g appreciated Respiratory: Mild intermittent expiratory wheezes at bilateral lung bases with right greater than left, breathing and speaking comfortably on RA Abdominal: Soft, diffusely tender, nondistended, normoactive bowel sounds Extremities: Moves all extremities grossly equally Neurological: Cranial nerves II through XII intact, strength and sensation intact throughout, gait normal Psychiatric: Appropriate mood and affect   ASSESSMENT/PLAN:   Dyspnea Worsening.  History and exam concerning for malignancy.  Specifically, elevated alkaline phosphatase with elevated serum protein  concerning for multiple myeloma.  Of note, patient did see hematology where they obtained a multiple myeloma panel in 2021 which was elevated, but it appears he was lost to follow-up.  Given high suspicion for malignancy and procoagulant state, tachycardia could also indicate pulmonary embolism.  Given patient's social determinants of health and possibility of PE, spoke with family medicine teaching service who will admit patient to the hospital for further evaluation and management.  Appreciate assistance.   Janeal Holmes, MD [facility]

## 2022-11-19 NOTE — Assessment & Plan Note (Signed)
Worsening.  History and exam concerning for malignancy.  Specifically, elevated alkaline phosphatase with elevated serum protein concerning for multiple myeloma.  Of note, patient did see hematology where they obtained a multiple myeloma panel in 2021 which was elevated, but it appears he was lost to follow-up.  Given high suspicion for malignancy and procoagulant state, tachycardia could also indicate pulmonary embolism.  Given patient's social determinants of health and possibility of PE, spoke with family medicine teaching service who will admit patient to the hospital for further evaluation and management.  Appreciate assistance.

## 2022-11-19 NOTE — Assessment & Plan Note (Addendum)
Currently has no insurance and has difficulty with transportation. Language barrier is also affecting his care.  - TOC for Medicaid assistance and transportation

## 2022-11-19 NOTE — Progress Notes (Signed)
Patient admitted to the floor at 1545PM . Alert and oriented. On room air. Independent. Patient is non-english speaker. Will continue to monitor.

## 2022-11-19 NOTE — H&P (Addendum)
Hospital Admission History and Physical Service Pager: [phone]  Patient name: Arthur Ali Medical record number: [id number] Date of Birth: [birth year] Age: 62 y.o. Gender: male  Primary Care Provider: Glendale Chard, DO Consultants: Hematology Code Status: Full Code Preferred Emergency Contact:  Can contact [place] as emergency contact  Contact Information     Name Relation Home Work [place] Neighbor   [phone]   Bosie Helper Daughter   [phone]      Other Contacts     Name Relation Home Work Mobile   [place]    [phone]      Chief Complaint: B symptoms  Assessment and Plan: Arthur Ali is a 62 y.o. male presenting with worsening dyspnea, chest pain, night sweats, loss of appetite and unintentional weight loss. Differentials for his presentation include B-cell pathology due to having a elevated multiple myeloma panel in 2021, elevated alkaline phosphatase and elevated serum protein, but has been lost to follow up and lung cancer with his long smoking history of almost 50 years prior to quitting. Differentials for his presentation also include PE (as cause of  new onset SOB and CP), TB (possible given history and  B sx, cough, but unlikely due to no recent travel, no sputum, and negative Quant gold in 2021), or ACS (as cause of chest pain and point tenderness on exam).   Assessment & Plan Unintentional weight loss 2 years of B symptoms which worsened over the past month. Previous multiple myeloma panel in 2021 with elevated IgG and IgM, elevated total protein 9.6>10.2, and elevated alkaline phosphatase 560, after which he was lost to follow up. Concern for B-cell pathology vs lung cancer, here for further workup due to deconditioning.  - Admit to FMTS, attending Dr. Manson Passey - Med-Tele, Vital signs per floor - Regular diet and Ensure - PT/OT to treat - VTE prophylaxis: Lovenox - Labs: CBC w/diff, CMP, HIV, Mag, Phos,  PT/INR, PTT, Quantiferon-Gold, Troponin, Phosphorus - Imaging: CTPE and Lumbar XR - Multiple myeloma panel - EKG pending - Heme/Onc consulted, appreciate recs - Pain: Tylenol and Lidoderm patch - AM CBC/CMP, Mag, Phos Tachycardia Likely secondary to his symptoms and lack of appetite/malnutrition. Presenting with CP and SOB. - CTPE to rule out PE Encounter for screening involving social determinants of health [facility]) Currently has no insurance and has difficulty with transportation. Language barrier is also affecting his care.  - TOC for Medicaid assistance and transportation  Chronic and Stable Problems: none  FEN/GI: Regular Diet VTE Prophylaxis: Lovenox  Disposition: Med-Tele  History of Present Illness:  Arthur Ali is a 62 y.o. male past medical history of chronic back pain and abnormal labs suggesting a possible underlying monoclonal B-cell process presenting with dyspnea, chest pain, bone pain, night sweats, loss of appetite and unintentional weight loss directly admitted from the [facility] Medicine clinic. 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   He has gone to urgent care with these symptoms.  Patient states that his symptoms started when he came to the [place] about 2 years ago.  He had a multiple myeloma panel 2 years ago which was concerning, but he was lost to follow up. However his symptoms have been exacerbated over the past month. He has been feeling feverish, having shortness of breath, coughing, having chest pain, night sweats, worsening back pain, bilateral knee pain, and having loss of appetite and losing weight unintentionally.  Recently his shortness of breath has improved.  Denies any abdominal pain, nausea, vomiting, diarrhea,  leg swelling, loss of consciousness, dizziness, vision changes, recent travel, family history of cancer or sick contacts.  Review Of Systems: Per HPI with the following additions: As above  Pertinent Past Medical  History: Back pain HTN Remainder reviewed in history tab.   Pertinent Past Surgical History: None  Remainder reviewed in history tab.  Pertinent Social History: Tobacco use: Former, smoked for about 50 years, quit 2 years Alcohol use: Quit drinking 3 months Other Substance use: none Lives with self  Pertinent Family History: None Remainder reviewed in history tab.   Important Outpatient Medications: Denies taking any medications regularly Remainder reviewed in medication history.   Objective: BP 124/88 (BP Location: Right Arm)   Temp 98.3 F (36.8 C) (Oral)   Resp 17   Ht 5\' 9"  (1.753 m)   SpO2 100%   BMI 16.66 kg/m  Exam: General: Cachectic, ill-appearing, awake and alert male in NAD HEENT: NCAT, sunken cheeks, no nasal discharge, poor dentition Cardiovascular: Tachycardic.  No M/R/G.  TTP over sternum Respiratory: Diminished breath sounds.  No wheezing, crackles, or rhonchi. Speaking comfortably in full sentences. Normal WOB on RA. Gastrointestinal: Thin, non-tender, non-distended. Normoactive bowel sounds.  MSK: Moves all extremities. TTP over thoracic and lumbar spine. TTP over bilateral patella. No BLE edema Neuro: AAOx3. No focal neurological deficits. Derm: Warm and dry Psych: Normal mood and affect  Labs: 8/29 TSH - 1.855 CMP - Ca 8.4, Total protein 10.2, Albumin 2.9, Alk phos 560 Multiple Myeloma -  IgG 3697, IgM 2767, Total protein 9.6, Gamma Glob  EKG: pending  Imaging Studies Performed: Lumbar XR and CTPE pending  Lincoln Brigham, MD 11/19/2022, 6:02 PM PGY-1, [facility] Family Medicine  FPTS Intern pager: [phone], text pages welcome Secure chat group [facility] Teaching Service

## 2022-11-19 NOTE — Progress Notes (Signed)
Patient was taken for X ray via wheel chair at 1850PM.

## 2022-11-20 DIAGNOSIS — R972 Elevated prostate specific antigen [PSA]: Secondary | ICD-10-CM | POA: Diagnosis not present

## 2022-11-20 DIAGNOSIS — E43 Unspecified severe protein-calorie malnutrition: Secondary | ICD-10-CM | POA: Diagnosis not present

## 2022-11-20 DIAGNOSIS — R634 Abnormal weight loss: Secondary | ICD-10-CM | POA: Diagnosis not present

## 2022-11-20 DIAGNOSIS — D649 Anemia, unspecified: Secondary | ICD-10-CM

## 2022-11-20 DIAGNOSIS — D72820 Lymphocytosis (symptomatic): Secondary | ICD-10-CM | POA: Diagnosis not present

## 2022-11-20 DIAGNOSIS — R918 Other nonspecific abnormal finding of lung field: Secondary | ICD-10-CM

## 2022-11-20 DIAGNOSIS — C7951 Secondary malignant neoplasm of bone: Principal | ICD-10-CM | POA: Insufficient documentation

## 2022-11-20 DIAGNOSIS — C61 Malignant neoplasm of prostate: Secondary | ICD-10-CM

## 2022-11-20 DIAGNOSIS — G893 Neoplasm related pain (acute) (chronic): Secondary | ICD-10-CM

## 2022-11-20 DIAGNOSIS — M899 Disorder of bone, unspecified: Secondary | ICD-10-CM

## 2022-11-20 DIAGNOSIS — I272 Pulmonary hypertension, unspecified: Secondary | ICD-10-CM

## 2022-11-20 DIAGNOSIS — Z87891 Personal history of nicotine dependence: Secondary | ICD-10-CM

## 2022-11-20 LAB — CBC
HCT: 33.8 % — ABNORMAL LOW (ref 39.0–52.0)
Hemoglobin: 10.5 g/dL — ABNORMAL LOW (ref 13.0–17.0)
MCH: 26.7 pg (ref 26.0–34.0)
MCHC: 31.1 g/dL (ref 30.0–36.0)
MCV: 86 fL (ref 80.0–100.0)
Platelets: 174 10*3/uL (ref 150–400)
RBC: 3.93 MIL/uL — ABNORMAL LOW (ref 4.22–5.81)
RDW: 13.7 % (ref 11.5–15.5)
WBC: 10.2 10*3/uL (ref 4.0–10.5)
nRBC: 0.2 % (ref 0.0–0.2)

## 2022-11-20 LAB — COMPREHENSIVE METABOLIC PANEL
ALT: 18 U/L (ref 0–44)
AST: 23 U/L (ref 15–41)
Albumin: 1.8 g/dL — ABNORMAL LOW (ref 3.5–5.0)
Alkaline Phosphatase: 497 U/L — ABNORMAL HIGH (ref 38–126)
Anion gap: 7 (ref 5–15)
BUN: 13 mg/dL (ref 8–23)
CO2: 25 mmol/L (ref 22–32)
Calcium: 7.9 mg/dL — ABNORMAL LOW (ref 8.9–10.3)
Chloride: 100 mmol/L (ref 98–111)
Creatinine, Ser: 0.76 mg/dL (ref 0.61–1.24)
GFR, Estimated: >60 mL/min (ref >60)
Glucose, Bld: 88 mg/dL (ref 70–99)
Potassium: 4.5 mmol/L (ref 3.5–5.1)
Sodium: 132 mmol/L — ABNORMAL LOW (ref 135–145)
Total Bilirubin: 0.3 mg/dL (ref 0.3–1.2)
Total Protein: 7.2 g/dL (ref 6.5–8.1)

## 2022-11-20 LAB — MAGNESIUM: Magnesium: 1.9 mg/dL (ref 1.7–2.4)

## 2022-11-20 LAB — PSA: Prostatic Specific Antigen: 1444.22 ng/mL — ABNORMAL HIGH (ref 0.00–4.00)

## 2022-11-20 LAB — PHOSPHORUS: Phosphorus: 3.2 mg/dL (ref 2.5–4.6)

## 2022-11-20 MED ORDER — ACETAMINOPHEN 500 MG PO TABS
1000.0000 mg | ORAL_TABLET | Freq: Three times a day (TID) | ORAL | Status: DC
  Administered 2022-11-20 – 2022-11-26 (×18): 1000 mg via ORAL
  Filled 2022-11-20 (×19): qty 2

## 2022-11-20 MED ORDER — OXYCODONE HCL 5 MG PO TABS
5.0000 mg | ORAL_TABLET | ORAL | Status: DC | PRN
  Administered 2022-11-20: 5 mg via ORAL
  Filled 2022-11-20: qty 1

## 2022-11-20 MED ORDER — ONDANSETRON HCL 4 MG/2ML IJ SOLN
4.0000 mg | Freq: Three times a day (TID) | INTRAMUSCULAR | Status: DC | PRN
  Administered 2022-11-25 – 2022-11-26 (×2): 4 mg via INTRAVENOUS
  Filled 2022-11-20 (×2): qty 2

## 2022-11-20 MED ORDER — ACETAMINOPHEN 650 MG RE SUPP
650.0000 mg | Freq: Three times a day (TID) | RECTAL | Status: DC
  Filled 2022-11-20: qty 1

## 2022-11-20 MED ORDER — DEXAMETHASONE 4 MG PO TABS
4.0000 mg | ORAL_TABLET | Freq: Two times a day (BID) | ORAL | Status: DC
  Administered 2022-11-20 – 2022-11-24 (×8): 4 mg via ORAL
  Filled 2022-11-20 (×9): qty 1

## 2022-11-20 MED ORDER — LEUPROLIDE ACETATE 7.5 MG IM KIT
7.5000 mg | PACK | Freq: Once | INTRAMUSCULAR | Status: AC
  Administered 2022-11-21: 7.5 mg via INTRAMUSCULAR
  Filled 2022-11-20: qty 7.5

## 2022-11-20 MED ORDER — BICALUTAMIDE 50 MG PO TABS
50.0000 mg | ORAL_TABLET | Freq: Every day | ORAL | Status: DC
  Administered 2022-11-20 – 2022-11-26 (×7): 50 mg via ORAL
  Filled 2022-11-20 (×7): qty 1

## 2022-11-20 MED ORDER — VITAMIN D (ERGOCALCIFEROL) 1.25 MG (50000 UNIT) PO CAPS
50000.0000 [IU] | ORAL_CAPSULE | ORAL | Status: DC
  Administered 2022-11-20: 50000 [IU] via ORAL
  Filled 2022-11-20: qty 1

## 2022-11-20 MED ORDER — OXYCODONE HCL 5 MG PO TABS
10.0000 mg | ORAL_TABLET | ORAL | Status: DC | PRN
  Administered 2022-11-20 – 2022-11-26 (×17): 10 mg via ORAL
  Filled 2022-11-20 (×17): qty 2

## 2022-11-20 MED ORDER — POLYETHYLENE GLYCOL 3350 17 G PO PACK
17.0000 g | PACK | Freq: Every day | ORAL | Status: DC
  Administered 2022-11-20 – 2022-11-22 (×3): 17 g via ORAL
  Filled 2022-11-20 (×4): qty 1

## 2022-11-20 NOTE — Progress Notes (Addendum)
Daily Progress Note Intern Pager: [phone]  Patient name: Arthur Ali Medical record number: [id number] Date of birth: 1960-05-09 Age: 62 y.o. 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  Assessment & Plan Unintentional weight loss 2 years of B symptoms which worsened over the past month. Previous multiple myeloma panel in 2021 with elevated IgG and IgM, elevated total protein 9.6>10.2, and elevated alkaline phosphatase 560, after which he was lost to follow up. 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 PSA [id number]. Oncology consulted due to concern for metastatic prostate cancer.  - Regular diet and Ensure - PT/OT to treat - Labs: Quantiferon-Gold, CBC, CMP, Mag, Phos - Imaging: Echo - Multiple myeloma panel - Oncology consulted, appreciate recs - Pain: Tylenol 1000 mg q8h, Lidoderm patch, Oxycodone 10 mg q4h PRN Tachycardia (Resolved: 11/20/2022) Likely secondary to his symptoms and lack of appetite/malnutrition. Presenting with CP and SOB. - CTPE ruled out PE Encounter for screening involving social determinants of health (SDoH) Currently has no insurance and has difficulty with transportation. Language barrier is also affecting his care.  - TOC for Medicaid assistance and transportation - Consulted spiritual and palliative to discuss diagnosis and options further  Chronic and Stable Problems: none  FEN/GI: Regular Diet PPx: Lovenox Dispo: pending oncology recs  Subjective:  Interpreter Ephriam Knuckles) was used to talk to patient.  Patient is doing better this morning regarding his back  pain and chest pain, however it still persists.  He reports that his appetite is improved and he completed his full breakfast this morning.  Shares that he has no family due to most of his family being lost due to war in his country.  He wanted to discuss his options but we talked about oncology coming to speak with him today to further evaluate.  Objective: Temp:  [97.4 F (36.3 C)-99.3 F (37.4 C)] 97.4 F (36.3 C) (09/13 0739) Pulse Rate:  [81-119] 84 (09/13 0739) Resp:  [17-18] 17 (09/13 0739) BP: (124-153)/(82-91) 126/88 (09/13 0739) SpO2:  [95 %-100 %] 96 % (09/13 0739) Weight:  [51.2 kg] 51.2 kg (09/12 1425) Physical Exam: General: Cachectic, NAD, awake and alert HEENT: Normocephalic, atraumatic. Sunken cheeks. Conjunctiva normal. No nasal discharge. Poor dentition Cardiovascular: RRR. No M/R/G. Mild TTP over sternum Respiratory: Diminished breath sounds, normal WOB on RA. No wheezing, crackles, rhonchi, or diminished breath sounds. Abdomen: Soft, non-tender, non-distended. Bowel sounds normoactive Extremities: Moves all extremities. Mild TTP over thoracic spine. No BLE edema, no deformities or significant joint findings. Skin: Warm and dry. Neuro: A&Ox3. No focal neurological deficits.   Laboratory: Most recent CBC Lab Results  Component Value Date   WBC 10.2 11/20/2022   HGB 10.5 (L) 11/20/2022   HCT 33.8 (L) 11/20/2022   MCV 86.0 11/20/2022   PLT 174 11/20/2022   Most recent BMP    Latest Ref Rng & Units 11/20/2022    6:23 AM  BMP  Glucose 70 - 99 mg/dL 88   BUN 8 - 23 mg/dL 13   Creatinine 1.47 - 1.24 mg/dL 8.29   Sodium 562 - 130 mmol/L 132   Potassium 3.5 - 5.1 mmol/L 4.5   Chloride 98 - 111  mmol/L 100   CO2 22 - 32 mmol/L 25   Calcium 8.9 - 10.3 mg/dL 7.9    PSA: 0,272.53 Mag: 1.9 Phos: 3.2  Imaging/Diagnostic Tests: No new imaging.  Fortunato Curling, DO 11/20/2022, 10:59 AM  PGY-1, [facility] Health Family Medicine FPTS Intern pager: [phone], text pages  welcome Secure chat group [facility] Teaching Service

## 2022-11-20 NOTE — Congregational Nurse Program (Signed)
  Dept: [phone]   Congregational Nurse Program Note  Date of Encounter: 11/20/2022  Past Medical History: Past Medical History:  Diagnosis Date   Back pain    Hypertension     Encounter Details:   I have contacted patient via whatsapp and I was able to communicate with him in Swahili although his primary language is [place].   He is apprehensive about his illness and his finances. He will be unable to pay his bills including rent unless he returns to his work soon.  He lives at an apartment by himself. He has no family. His rent and utilities total is about $ 1000.He has paid rent for September but has no rent for October.I will reach out to [facility] for assistance but they are limited to one time assistance only.  Patient has my cell phone number to reach out upon discharge for assistance with transportation to appointments.  Nicole Cella Narely Nobles RN BSN PCCN  [facility] [phone]-office

## 2022-11-20 NOTE — Evaluation (Signed)
Occupational Therapy Evaluation Patient Details Name: Arthur Ali MRN: [id number] DOB: 1960-09-20 Today's Date: 11/20/2022   History of Present Illness 62 y.o. male presenting 11/19/2022 with worsening dyspnea, chest pain, night sweats, loss of appetite and unintentional weight loss. 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 Lumbar xray with diffuse mottled appearance of the femoral heads and pelvis. PMH significant of chronic back pain and abnormal labs suggesting a possible underlying monoclonal B-cell process   Clinical Impression   PTA, pt lived alone and was mod I for ADL, IADL, and work. Pt does not drive and it does not seem that the pt has much support in the area. Upon eval, pt requiring CGA for OOB mobility and is limited by general malaise, weakness, fatigue, and poor activity tolerance. Acute OT to follow and recommending maximize home health and transportation services. Due to nature of illness, if unable to receive help at home will need inpatient rehab <3 hours/day.       If plan is discharge home, recommend the following: Assist for transportation;Assistance with cooking/housework    Functional Status Assessment  Patient has had a recent decline in their functional status and demonstrates the ability to make significant improvements in function in a reasonable and predictable amount of time.  Equipment Recommendations  BSC/3in1    Recommendations for Other Services PT consult     Precautions / Restrictions Precautions Precautions: Other (comment);Fall Precaution Comments: airborne/contact Restrictions Weight Bearing Restrictions: No      Mobility Bed Mobility Overal bed mobility: Modified Independent             General bed mobility comments: increased time    Transfers Overall transfer level: Needs  assistance Equipment used: None Transfers: Sit to/from Stand Sit to Stand: Contact guard assist           General transfer comment: slow to rise      Balance Overall balance assessment: Needs assistance Sitting-balance support: No upper extremity supported, Feet supported Sitting balance-Leahy Scale: Fair     Standing balance support: No upper extremity supported, During functional activity Standing balance-Leahy Scale: Fair Standing balance comment: CGA no device                           ADL either performed or assessed with clinical judgement   ADL Overall ADL's : Needs assistance/impaired Eating/Feeding: Independent   Grooming: Contact guard assist;Standing   Upper Body Bathing: Set up;Sitting   Lower Body Bathing: Sit to/from stand;Minimal assistance   Upper Body Dressing : Set up;Sitting   Lower Body Dressing: Minimal assistance;Sit to/from stand   Toilet Transfer: Contact guard assist;Ambulation   Toileting- Clothing Manipulation and Hygiene: Set up;Sitting/lateral lean       Functional mobility during ADLs: Contact guard assist       Vision   Additional Comments: pt did not provide visual report     Perception Perception: Not tested       Praxis Praxis: Not tested       Pertinent Vitals/Pain Pain Assessment Pain Assessment: 0-10 Pain Score: 8  Pain Location: lower back on R, chest Pain Descriptors / Indicators: Aching, Sore, Tender Pain Intervention(s): Limited activity within patient's tolerance, Monitored during session     Extremity/Trunk Assessment Upper Extremity Assessment Upper Extremity Assessment: Generalized weakness   Lower Extremity Assessment Lower Extremity Assessment: Defer to PT evaluation  Communication Communication Communication: No apparent difficulties;Other (comment) (prefers kinyarwanda) Cueing Techniques: Gestural cues   Cognition Arousal: Alert Behavior During Therapy: WFL for tasks  assessed/performed Overall Cognitive Status: Difficult to assess                                       General Comments  VSS    Exercises     Shoulder Instructions      Home Living Family/patient expects to be discharged to:: Private residence Living Arrangements: Alone Available Help at Discharge: Other (Comment) (pt reports he has no one at home) Type of Home: Apartment Home Access: Level entry     Home Layout: One level     Bathroom Shower/Tub: Tub/shower unit         Home Equipment: None          Prior Functioning/Environment Prior Level of Function : Independent/Modified Independent;Working/employed             Mobility Comments: no AD ADLs Comments: works in Designer, fashion/clothing; reporting irt has been difficult to do LB ADL for some time        OT Problem List: Decreased strength;Decreased activity tolerance;Impaired balance (sitting and/or standing);Decreased knowledge of use of DME or AE;Pain      OT Treatment/Interventions: Self-care/ADL training;Therapeutic exercise;DME and/or AE instruction;Balance training;Patient/family education;Therapeutic activities    OT Goals(Current goals can be found in the care plan section) Acute Rehab OT Goals Patient Stated Goal: none stated OT Goal Formulation: With patient Time For Goal Achievement: 12/04/22 Potential to Achieve Goals: Good  OT Frequency: Min 1X/week    Co-evaluation              AM-PAC OT "6 Clicks" Daily Activity     Outcome Measure Help from another person eating meals?: None Help from another person taking care of personal grooming?: A Little Help from another person toileting, which includes using toliet, bedpan, or urinal?: A Little Help from another person bathing (including washing, rinsing, drying)?: A Little Help from another person to put on and taking off regular upper body clothing?: A Little Help from another person to put on and taking off regular lower body  clothing?: A Little 6 Click Score: 19   End of Session Equipment Utilized During Treatment: Gait belt Nurse Communication: Mobility status  Activity Tolerance: Patient tolerated treatment well Patient left: in bed;with call bell/phone within reach;with bed alarm set  OT Visit Diagnosis: Unsteadiness on feet (R26.81);Muscle weakness (generalized) (M62.81);Pain Pain - part of body:  (back)                Time: 1610-9604 OT Time Calculation (min): 20 min Charges:  OT General Charges $OT Visit: 1 Visit OT Evaluation $OT Eval Moderate Complexity: 1 Mod  Tyler Deis, OTR/L [facility] Acute Rehabilitation Office: [phone]   Myrla Halsted 11/20/2022, 1:59 PM

## 2022-11-20 NOTE — Consult Note (Addendum)
HEMATOLOGY/ONCOLOGY CONSULTATION NOTE  Date of Service: 11/20/2022  Patient Care Team: Glendale Chard, DO as PCP - General (Family Medicine)  CHIEF COMPLAINTS/PURPOSE OF CONSULTATION:  Evaluation and management of likely metastatic prostate cancer  HISTORY OF PRESENTING ILLNESS:  Arthur Ali is a wonderful 62 y.o. male who is being seen for evaluation and management of likely metastatic prostate cancer based on elevated PSA levels.    Patient was previously seen by me in 2021 for lymphocytosis thought to be a reactive process. At that time, he had no signs of monoclonal lymphocytes. He was also vitamin B12 deficient at that time and his pseudothrombocytopenia had resolved.   Patient presented to the hospital with worsening dyspnea chest pain night sweats loss of appetite and unintentional weight loss and will directly admitted from the family medicine clinic.  CT of the chest done on 11/19/2022 showed no evidence of pulmonary embolism but did show pathologic thoracic adenopathy within the right paratracheal and subcarinal lymph node groups as well as widespread sclerotic metastatic disease throughout the visualized axial skeleton.  He was noted to have a 6 mm noncalcified pulmonary nodule in the right upper lobe.  Also noted to have mild central pulmonary artery enlargement with suggestions of elevated right heart pressure. He also had a x-ray of the lumbar spine which showed degenerative change of the lumbar spine and diffuse mottled appearance of the femoral heads and pelvis which could be concerning for metastatic disease.  Patient had a PSA test which showed significant elevation to nearly 1500 being highly suggestive of metastatic prostate cancer. TB QuantiFERON test is currently pending.  Patient CBC today shows normal WBC count of 10.2k with anemia with a hemoglobin of 10.5 and a platelet count of 174k. CMP shows hypocalcemia with calcium of 7.9, decreased albumin of 1.8 and  alkaline phosphatase of 500. Normal transaminases and bilirubin.  Creatinine is within normal limits at 0.76.  HIV test is nonreactive TSH was within normal limits at 1.85 Multiple myeloma panel and K/L FLC are currently pending.  Patient was seen in airborne isolation with the help of video [place] interpreter Rella Larve).  He notes that he has lost 10 to 15 kg of body weight with significantly decreased p.o. intake.  Notes decreased urinary flow about a week ago but is a little better now.  No overt hematuria.  Notes some issues with constipation. Has had diffuse bodyaches especially over the spine and pelvis.   Also notes some shortness of breath. Chest wall pain noted. Notes no new upper or lower extremity weakness, no loss of bowel or bladder control.  MEDICAL HISTORY:  Past Medical History:  Diagnosis Date   Back pain    Hypertension     SURGICAL HISTORY: History reviewed. No pertinent surgical history.  SOCIAL HISTORY: Social History   Socioeconomic History   Marital status: Married    Spouse name: Not on file   Number of children: Not on file   Years of education: Not on file   Highest education level: Not on file  Occupational History   Not on file  Tobacco Use   Smoking status: Former    Types: Cigars, Cigarettes   Smokeless tobacco: Never   Tobacco comments:    Quit in 2022.   Vaping Use   Vaping status: Never Used  Substance and Sexual Activity   Alcohol use: Never   Drug use: Never   Sexual activity: Not Currently  Other Topics Concern   Not on file  Social History Narrative   Single applicant from [place]   No family here or in [place]      Refugee Information   Number of Immediate Family Members: 0   Number of Immediate Family Members in [place]: 0   Country of Birth: [facility]   Country of Origin: [facility]   Location of Refugee Camp: [place]   Duration in [place]: 20 years or greater   Reason for Leaving Home Country: Political opinion   Primary Language:  Swahili/Kiswahili, Kinyarwanda/[place]   Able to Read in Primary Language: Yes   Able to Write in Primary Language: Yes   Education: Primary School   Prior Work: No previous jobs   Marital Status: Other (Wife and children decreased)   Sexual Activity: No   Tuberculosis Screening Overseas: Positive   Tuberculosis Screening Health Department: Not Completed   Health Department Labs Completed: Yes   History of Trauma: None   Do You Feel Jumpy or Nervous?: No   Are You Very Watchful or 'Super Alert'?: No   Social Determinants of Health   Financial Resource Strain: Not on file  Food Insecurity: No Food Insecurity (11/19/2022)   Hunger Vital Sign    Worried About Running Out of Food in the Last Year: Never true    Ran Out of Food in the Last Year: Never true  Transportation Needs: No Transportation Needs (11/19/2022)   PRAPARE - Administrator, Civil Service (Medical): No    Lack of Transportation (Non-Medical): No  Physical Activity: Not on file  Stress: Not on file  Social Connections: Not on file  Intimate Partner Violence: Not At Risk (11/19/2022)   Humiliation, Afraid, Rape, and Kick questionnaire    Fear of Current or Ex-Partner: No    Emotionally Abused: No    Physically Abused: No    Sexually Abused: No    FAMILY HISTORY: Family History  Family history unknown: Yes    ALLERGIES:  has No Known Allergies.  MEDICATIONS:  Current Facility-Administered Medications  Medication Dose Route Frequency Provider Last Rate Last Admin   acetaminophen (TYLENOL) tablet 1,000 mg  1,000 mg Oral Q8H Jones, Sarah, DO   1,000 mg at 11/20/22 4098   Or   acetaminophen (TYLENOL) suppository 650 mg  650 mg Rectal Q8H Erick Alley, DO       enoxaparin (LOVENOX) injection 40 mg  40 mg Subcutaneous Q24H Lincoln Brigham, MD   40 mg at 11/19/22 2033   feeding supplement (ENSURE ENLIVE / ENSURE PLUS) liquid 237 mL  237 mL Oral BID BM [name], MD   237 mL at 11/20/22 0924   influenza  vac split trivalent PF (FLULAVAL) injection 0.5 mL  0.5 mL Intramuscular Prior to discharge [name], MD       lidocaine (LIDODERM) 5 % 1 patch  1 patch Transdermal Q24H Fortunato Curling, DO   1 patch at 11/19/22 1747   oxyCODONE (Oxy IR/ROXICODONE) immediate release tablet 10 mg  10 mg Oral Q4H PRN Erick Alley, DO   10 mg at 11/20/22 0301    REVIEW OF SYSTEMS:    10 Point review of Systems was done is negative except as noted above.  PHYSICAL EXAMINATION: ECOG PERFORMANCE STATUS: 3 - Symptomatic, >50% confined to bed  . Vitals:   11/20/22 0433 11/20/22 0739  BP: (!) 137/90 126/88  Pulse: 81 84  Resp: 18 17  Temp: (!) 97.5 F (36.4 C) (!) 97.4 F (36.3 C)  SpO2: 98% 96%  There were no vitals filed for this visit. .Body mass index is 16.66 kg/m.  GENERAL:alert, in no acute distress, cachectic appearing SKIN: no acute rashes, no significant lesions EYES: conjunctiva are pink and non-injected, sclera anicteric OROPHARYNX: MMM, no exudates, no oropharyngeal erythema or ulceration NECK: supple, no JVD LYMPH:  no palpable lymphadenopathy in the cervical, axillary or inguinal regions LUNGS: clear to auscultation b/l with normal respiratory effort HEART: regular rate & rhythm ABDOMEN:  normoactive bowel sounds , mild tenderness to palpation over abdomen no rigidity or rebound Extremity: no pedal edema.  Tenderness to palpation over multiple parts of the spine and pelvis PSYCH: alert & oriented. NEURO: no focal motor/sensory deficits  LABORATORY DATA:  I have reviewed the data as listed  .    Latest Ref Rng & Units 11/20/2022    6:23 AM 11/19/2022    8:20 PM 11/05/2022    5:13 PM  CBC  WBC 4.0 - 10.5 K/uL 10.2  9.8  10.2   Hemoglobin 13.0 - 17.0 g/dL 44.0  34.7  42.5   Hematocrit 39.0 - 52.0 % 33.8  35.9  47.8   Platelets 150 - 400 K/uL 174  196  167     .    Latest Ref Rng & Units 11/20/2022    6:23 AM 11/19/2022    8:20 PM 11/05/2022    5:13 PM  CMP  Glucose  70 - 99 mg/dL 88  956  95   BUN 8 - 23 mg/dL 13  13  19    Creatinine 0.61 - 1.24 mg/dL 3.87  5.64  3.32   Sodium 135 - 145 mmol/L 132  138  135   Potassium 3.5 - 5.1 mmol/L 4.5  4.3  4.5   Chloride 98 - 111 mmol/L 100  98  99   CO2 22 - 32 mmol/L 25  26  26    Calcium 8.9 - 10.3 mg/dL 7.9  8.6  8.4   Total Protein 6.5 - 8.1 g/dL 7.2  7.9  95.1   Total Bilirubin 0.3 - 1.2 mg/dL 0.3  0.4  0.3   Alkaline Phos 38 - 126 U/L 497  503  560   AST 15 - 41 U/L 23  26  32   ALT 0 - 44 U/L 18  21  18     Corrected calcium is 9.66   RADIOGRAPHIC STUDIES: I have personally reviewed the radiological images as listed and agreed with the findings in the report. DG Lumbar Spine 2-3 Views  Result Date: 11/19/2022 CLINICAL DATA:  Pain EXAM: LUMBAR SPINE - 2-3 VIEW COMPARISON:  None Available. FINDINGS: There is no evidence of lumbar spine fracture. Alignment is normal. Intervertebral disc spaces are maintained. Mild degenerative endplate osteophytes are seen throughout the lumbar spine. There is diffuse mottled appearance of femoral heads and pelvis. IMPRESSION: 1. Mild degenerative changes of the lumbar spine. 2. Diffuse mottled appearance of the femoral heads and pelvis, which may be due to osteopenia, metastatic disease, or metabolic bone disease. Electronically Signed   By: Darliss Cheney M.D.   On: 11/19/2022 23:01   CT Angio Chest Pulmonary Embolism (PE) W or WO Contrast  Result Date: 11/19/2022 CLINICAL DATA:  High probability pulmonary embolism EXAM: CT ANGIOGRAPHY CHEST WITH CONTRAST TECHNIQUE: Multidetector CT imaging of the chest was performed using the standard protocol during bolus administration of intravenous contrast. Multiplanar CT image reconstructions and MIPs were obtained to evaluate the vascular anatomy. RADIATION DOSE REDUCTION: This exam was performed according to  the departmental dose-optimization program which includes automated exposure control, adjustment of the mA and/or kV according  to patient size and/or use of iterative reconstruction technique. CONTRAST:  60mL OMNIPAQUE IOHEXOL 350 MG/ML SOLN COMPARISON:  None Available. FINDINGS: Cardiovascular: There is adequate opacification of the pulmonary arterial tree. No intraluminal filling defect identified to suggest acute pulmonary embolism. Mild central pulmonary arterial enlargement in keeping with changes of pulmonary arterial hypertension. Global cardiac size is within normal limits. There is mild relative enlargement of the right ventricle and shift of the intraventricular septum to the left suggesting elevated right heart pressure. No pericardial effusion. Minimal atherosclerotic calcification within the thoracic aorta. No aortic aneurysm. Mediastinum/Nodes: There is pathologic thoracic adenopathy within the right paratracheal and subcarinal lymph node groups with the index lymph node measuring 4.6 x 2.5 cm at axial image # 42/7 within the right paratracheal lymph node group. Visualized thyroid is unremarkable. The esophagus is unremarkable. Lungs/Pleura: Mild emphysema. 6 mm noncalcified subpleural pulmonary nodule noted within the right upper lobe, axial image # 27/9. 6 mm noncalcified pulmonary nodule noted within the right apex at axial image # 21/9. No confluent pulmonary infiltrates. No pneumothorax or pleural effusion. Upper Abdomen: There is suspected retrocrural and perirenal adenopathy within the upper abdomen, not well delineated given the lack of intra-abdominal fat and phase of contrast enhancement. No acute adenopathy. Musculoskeletal: Widespread sclerotic metastatic disease throughout the visualized axial skeleton no associated pathologic fracture identified. Marked body wall wasting with near complete lack of a mediastinal and intra-abdominal fat within the visualized upper abdomen. Review of the MIP images confirms the above findings. IMPRESSION: 1. No pulmonary embolism. 2. Pathologic thoracic adenopathy within the right  paratracheal and subcarinal lymph node groups. Suspected retrocrural and perirenal adenopathy within the upper abdomen, not well delineated given the lack of intra-abdominal fat and phase of contrast enhancement. Widespread sclerotic metastatic disease throughout the visualized axial skeleton. Findings are in keeping with metastatic disease and correlation with serum PSA level may be helpful for further management. Dedicated nonemergent standard CT examination of the abdomen pelvis or PET CT examination may also be helpful for further evaluation. 3. 6 mm noncalcified pulmonary nodules within the right upper lobe. Follow-up imaging will be dependent upon the patient's underlying malignancy and treatment plan. 4. Mild central pulmonary arterial enlargement in keeping with changes of pulmonary arterial hypertension. Mild relative enlargement of the right ventricle and shift of the intraventricular septum to the left suggesting elevated right heart pressure. Aortic Atherosclerosis (ICD10-I70.0) and Emphysema (ICD10-J43.9). Electronically Signed   By: Helyn Numbers M.D.   On: 11/19/2022 23:00   DG Chest 2 View  Result Date: 11/05/2022 CLINICAL DATA:  Shortness of breath. EXAM: CHEST - 2 VIEW COMPARISON:  02/12/2020 FINDINGS: Lungs are hyperexpanded. The lungs are clear without focal pneumonia, edema, pneumothorax or pleural effusion. The cardiopericardial silhouette is within normal limits for size. No acute bony abnormality. IMPRESSION: Hyperexpansion without acute cardiopulmonary findings. Electronically Signed   By: Kennith Center M.D.   On: 11/05/2022 17:42    ASSESSMENT & PLAN:   62 y.o. male from [place] who primarily speaks [place] (very little [place]) presenting with shortness of breath, failure to thrive and generalized body pains  Likely metastatic prostate cancer with extensive skeletal lesions and thoracic adenopathy. PSA level nearly 1500.  2.  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  Pending myeloma panel and kappa lambda free light chains to rule out any monoclonal paraproteinemia.  Monoclonal paraproteinemia was ruled out on  previous evaluation.  3.  Mild lymphocytosis lymphocyte count of 5.2k previously noted to be nonclonal.  Likely from chronic inflammation/reactive.  Previously has been as high as 9.2k  4.  Findings of pulmonary hypertension on CTA--echocardiogram has been scheduled for tomorrow.  5.  Pulmonary infiltrate with mediastinal adenopathy-though this could be metastasis from his prostate cancer.  He is currently on airborne isolation with a pending TB QuantiFERON test.  6.  Severe protein calorie malnutrition with weight loss of 30 to 40 pounds due to his metastatic prostate cancer. PLAN:  -Discussed lab results on 11/20/22 in detail with patient. CBC showed WBC of 10.2K, hemoglobin of 10.5, and platelets of 174K. 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   -Previous lymphocytosis was evaluated by peripheral blood flow cytometry and was noted to be reactive and not clonal. -I discussed the patient's lab findings and imaging studies in detail with him and the new diagnosis of metastatic prostate cancer with extensive skeletal metastases likely lung metastases and mediastinal adenopathy. -He is having abdominal discomfort and urinary symptoms and when able with regards to her bone isolation would recommend getting a CT of the abdomen and pelvis and a whole-body bone scan. -Once CT-guided biopsy of abdomen and pelvis is done we will need CT-guided biopsy of one of the accessible lesions to confirm tissue diagnosis. -Corrected calcium currently is within normal limits. -Will check a vitamin D level and start him on high-dose vitamin D at 50,000 units weekly given likelihood of having very significant hungry  bone syndrome with high risk of hypocalcemia when started on bone directed therapies (bisphosphonates or Xgeva). -Given his extensive spinal metastasis we will start him on Casodex 50 mg p.o. daily today and -will give him the first dose of lupron 7.5 mg IM.do not have to wait for 2 weeks since there is no overt evidence of cord compression at this time. -Would continue the Casodex for at least 2 weeks till we can see him in clinic at which time he will be switched to [facility]. -He has poor dentition with poor dental hygiene making it more concerning to start him on a bisphosphonate or Xgeva at this time pending dental clearance. -Will need outpatient dental evaluation. -If the patient gets hypercalcemic we will need to proceed with bisphosphonate/Xgeva immediately even pending his dental evaluation. -I discussed his treatment considerations in detail with him with the help of the interpreter. -Given his significant skeletal discomfort will tentatively start him on dexamethasone 4 mg p.o. twice daily for a week and then can cut it down to 4 mg p.o. daily for 1 week then 2 mg p.o. daily for 1 week and then stop. -Nutritional therapy consultation to optimize nutritional status and start him on appropriate nutritional supplementation. -Will defer pulmonary workup and ruling out TB other infectious processes to the primary team. -Will help set up outpatient cancer center follow-up with me in 2 weeks  The total time spent in the appointment was 81 minutes* .  All of the patient's questions were answered with apparent satisfaction. The patient knows to call the clinic with any problems, questions or concerns.   Wyvonnia Lora MD MS  Hematology/Oncology Physician [facility]  .*Total Encounter Time as defined by the Centers for Medicare and Medicaid Services includes, in addition to the face-to-face time of a patient visit (documented in the note above) non-face-to-face time: obtaining and  reviewing outside history, ordering and reviewing medications, tests or procedures, care coordination (communications with other health care professionals or caregivers) and documentation in the medical record.    I,Mitra Faeizi,acting as a Neurosurgeon for No name on file.,have documented all relevant documentation on the behalf of No name on file,as directed by  No name on file while in the presence of No name on file.  .I have reviewed the above documentation for accuracy and completeness, and I agree with the above. Wyvonnia Lora MD MS Hematology/Oncology Physician [facility]

## 2022-11-20 NOTE — Progress Notes (Addendum)
FMTS Interim Progress Note  S: Patient in pain that keeps him from sleeping and has been feeling very feverish. Patient feels breathing has been difficult as well.   O: BP (!) 153/91 (BP Location: Right Arm)   Pulse (!) 103   Temp 99.3 F (37.4 C) (Oral)   Resp 18   Ht 5\' 9"  (1.753 m)   SpO2 100%   BMI 16.66 kg/m   General: In discomfort, shallow breathing  CV: Normal S1/S2. No extra heart sounds. Warm and well-perfused. Pulm: Breathing on room air. Notable abdominal rise and fall while breathing. Wheezes in upper lobes bilaterally. Diminished breath sounds throughout. No crackles.  MSK:  Notably cachectic. Pain with movement.  Psych: Pleasant and appropriate.   Labs/Imaging: CTA Chest: No pulmonary embolism. Widespread sclerotic metastatic disease throughout the visualized axial skeleton. 6 mm noncalcified pulmonary nodules within the right upper lobe. Mild central pulmonary arterial enlargement in keeping with changes of pulmonary arterial hypertension.  Lumbar XR: Diffuse mottled appearance of the femoral heads and pelvis, which may be due to osteopenia, metastatic disease, or metabolic bone disease.  Last CBC Lab Results  Component Value Date   WBC 9.8 11/19/2022   HGB 11.6 (L) 11/19/2022   HCT 35.9 (L) 11/19/2022   MCV 86.7 11/19/2022   MCH 28.0 11/19/2022   RDW 13.7 11/19/2022   PLT 196 11/19/2022    Last metabolic panel Lab Results  Component Value Date   GLUCOSE 121 (H) 11/19/2022   NA 138 11/19/2022   K 4.3 11/19/2022   CL 98 11/19/2022   CO2 26 11/19/2022   BUN 13 11/19/2022   CREATININE 0.75 11/19/2022   GFRNONAA >60 11/19/2022   CALCIUM 8.6 (L) 11/19/2022   PHOS 3.1 11/19/2022   PROT 7.9 11/19/2022   ALBUMIN 2.0 (L) 11/19/2022   LABGLOB 6.4 (H) 01/11/2020   AGRATIO 0.5 (L) 12/05/2019   BILITOT 0.4 11/19/2022   ALKPHOS 503 (H) 11/19/2022   AST 26 11/19/2022   ALT 21 11/19/2022   ANIONGAP 14 11/19/2022   HS Troponin: 9 PT 15, PTT 31, INR  1.2  A/P: Metastatic disease  - Hem/Onc consulted. Appreciate recs.  - Following CT findings, consider serum PSA level, nonemergent standard CT examination of the abdomen pelvis or PET CT for further evaluation.  Unintentional weight loss - Ensure supplement - Encourage PO intake - IVF as needed  Pain/Fever  - Oxycodone 10 q4 PRN - Tylenol q8 - Lidocaine patch    Ivery Quale, MD 11/20/2022, 3:14 AM PGY-1, [facility] pager [phone]

## 2022-11-20 NOTE — Assessment & Plan Note (Addendum)
Currently has no insurance and has difficulty with transportation. Language barrier is also affecting his care.  - TOC for Medicaid assistance and transportation - Consulted spiritual and palliative to discuss diagnosis and options further

## 2022-11-20 NOTE — Assessment & Plan Note (Addendum)
2 years of B symptoms which worsened over the past month. Previous multiple myeloma panel in 2021 with elevated IgG and IgM, elevated total protein 9.6>10.2, and elevated alkaline phosphatase 560, after which he was lost to follow up. 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 PSA [id number]. Oncology consulted due to concern for metastatic prostate cancer.  - Regular diet and Ensure - PT/OT to treat - Labs: Quantiferon-Gold, CBC, CMP, Mag, Phos - Imaging: Echo - Multiple myeloma panel - Oncology consulted, appreciate recs - Pain: Tylenol 1000 mg q8h, Lidoderm patch, Oxycodone 10 mg q4h PRN

## 2022-11-20 NOTE — Evaluation (Signed)
Physical Therapy Evaluation Patient Details Name: Arthur Ali MRN: [id number] DOB: [birth year] Today's Date: 11/20/2022  History of Present Illness  Pt is a 62 y/o M admitted on 11/19/22 after presenting with c/o worsening dyspnea, chest pain, night sweats, loss of appetite & unintentional weight loss. 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 PMH: back pain, HTN  Clinical Impression  Pt seen for PT evaluation with pt agreeable to tx. Utilized Human resources officer [phone]. Pt reports back & chest pain but notes it's better compared to when he arrived at the hospital. Pt notes prior to admission he was independent without AD, working as a heavy Location manager. On this date, pt ambulates in room without AD with close supervision<>CGA. Provided pt with RW for support during gait & pt able to ambulate with supervision with cuing to ambulate within base of AD.  Pt presents with limited mobility 2/2 back pain & requires rest breaks during session. Will continue to follow pt acutely to address endurance, activity tolerance, balance, & gait with LRAD.      If plan is discharge home, recommend the following: Assistance with cooking/housework;Assist for transportation;A little help with walking and/or transfers;A little help with bathing/dressing/bathroom   Can travel by private vehicle        Equipment Recommendations Rolling walker (2 wheels)  Recommendations for Other Services       Functional Status Assessment Patient has had a recent decline in their functional status and demonstrates the ability to make significant improvements in function in a reasonable and predictable amount of time.     Precautions / Restrictions Precautions Precautions: Fall Precaution Comments: airborne/contact Restrictions Weight Bearing Restrictions: No      Mobility  Bed Mobility               General  bed mobility comments: pt received sitting on EOB, left in recliner    Transfers   Equipment used: None Transfers: Sit to/from Stand Sit to Stand: Supervision           General transfer comment: STS from EOB without AD, with RW with cuing re: hand placement    Ambulation/Gait Ambulation/Gait assistance: Contact guard assist, Supervision Gait Distance (Feet): 20 Feet (+ 40 + 10 ft) Assistive device: None, Rolling walker (2 wheels)   Gait velocity: decreased     General Gait Details: Pt ambulates to door & back without AD with close supervision<>CGA with slow, guarded gait. Provided pt with RW & pt ambulates with supervision with ongoing cuing re: need to ambulate within base of AD with poor return demo.  Stairs            Wheelchair Mobility     Tilt Bed    Modified Rankin (Stroke Patients Only)       Balance Overall balance assessment: Needs assistance Sitting-balance support: No upper extremity supported, Feet supported Sitting balance-Leahy Scale: Good Sitting balance - Comments: received sitting EOB   Standing balance support: During functional activity, No upper extremity supported Standing balance-Leahy Scale: Fair                               Pertinent Vitals/Pain Pain Assessment Pain Assessment: Faces Faces Pain Scale: Hurts little more Pain Location: chest, back but notes pain has improved since admission Pain Descriptors / Indicators: Discomfort Pain Intervention(s): Monitored during session, Limited activity within patient's tolerance    Home  Living Family/patient expects to be discharged to:: Private residence Living Arrangements: Alone Available Help at Discharge: Other (Comment) (reports he only knows/has a guy who he works with) Type of Home: Apartment Home Access: Level entry       Home Layout: One level Home Equipment: None      Prior Function Prior Level of Function : Independent/Modified  Independent;Working/employed             Mobility Comments: does not use AD, operates large machinery ADLs Comments: works in Designer, fashion/clothing; reporting irt has been difficult to do LB ADL for some time     Extremity/Trunk Assessment   Upper Extremity Assessment Upper Extremity Assessment: Generalized weakness;Overall [facility] for tasks assessed    Lower Extremity Assessment Lower Extremity Assessment: Generalized weakness       Communication   Communication Communication: No apparent difficulties Cueing Techniques: Gestural cues  Cognition Arousal: Alert Behavior During Therapy: WFL for tasks assessed/performed Overall Cognitive Status: Difficult to assess                                          General Comments General comments (skin integrity, edema, etc.): VSS    Exercises     Assessment/Plan    PT Assessment Patient needs continued PT services  PT Problem List Decreased strength;Decreased coordination;Pain;Decreased range of motion;Decreased activity tolerance;Decreased balance;Decreased mobility;Decreased safety awareness;Decreased knowledge of use of DME       PT Treatment Interventions DME instruction;Balance training;Neuromuscular re-education;Gait training;Stair training;Functional mobility training;Patient/family education;Therapeutic exercise;Therapeutic activities    PT Goals (Current goals can be found in the Care Plan section)  Acute Rehab PT Goals Patient Stated Goal: none stated PT Goal Formulation: With patient Time For Goal Achievement: 12/04/22 Potential to Achieve Goals: Good    Frequency Min 1X/week     Co-evaluation               AM-PAC PT "6 Clicks" Mobility  Outcome Measure Help needed turning from your back to your side while in a flat bed without using bedrails?: None Help needed moving from lying on your back to sitting on the side of a flat bed without using bedrails?: None Help needed moving to and from a bed to  a chair (including a wheelchair)?: A Little Help needed standing up from a chair using your arms (e.g., wheelchair or bedside chair)?: A Little Help needed to walk in hospital room?: A Little Help needed climbing 3-5 steps with a railing? : A Little 6 Click Score: 20    End of Session   Activity Tolerance: Patient tolerated treatment well;Patient limited by pain Patient left: in chair;with call bell/phone within reach Nurse Communication: Mobility status PT Visit Diagnosis: Muscle weakness (generalized) (M62.81);Unsteadiness on feet (R26.81)    Time: 6578-4696 PT Time Calculation (min) (ACUTE ONLY): 20 min   Charges:   PT Evaluation $PT Eval Low Complexity: 1 Low   PT General Charges $$ ACUTE PT VISIT: 1 Visit         Aleda Grana, PT, DPT 11/20/22, 2:57 PM   Sandi Mariscal 11/20/2022, 2:55 PM

## 2022-11-20 NOTE — Assessment & Plan Note (Addendum)
Likely secondary to his symptoms and lack of appetite/malnutrition. Presenting with CP and SOB. - CTPE ruled out PE

## 2022-11-20 NOTE — Progress Notes (Addendum)
This chaplain responded to Dr. Manson Passey consult for spiritual care in the context of metastatic cancer, language barrier, and no family support. The chaplain received an update from the Congregational Nurse-Dorothy Muhoro and the Pt. RN-Angela as preparation for the visit.  The chaplain understands the Pt. primary language is Kinyarwanda. The Pt. RN is waiting on a possible interpreter visit today from 1-3pm. This chaplain is hopeful a spiritual care relationship can be introduced at this time.   The chaplain understands from the phone call with RN-Dorothy, the RN-Dorothy is willing to offer a listening presence, as needed with the Pt. The chaplain noted the Pt. is not eligible for a relationship with [facility] and RN Nicole Cella is checking into the possibility of a relationship with [employer] for assistance with food and transportation.   **[id number] This chaplain joined Kinyarwanda interpreter-Gilbert at the Pt. bedside for an initial spiritual care visit. The chaplain provided education on the role of Palliative Care and spiritual care. The chaplain informed the Pt. of a PMT provider visit this weekend.  The chaplain understands the Pt. is Catholic. The chaplain accepted the Pt. request for prayer. The chaplain will reach out to Father Ree Kida as needed.  The Pt. is appreciative of the connections to the community resources throughout the day and understood the boundaries of his eligibility for the connections. The Pt. RN-Elaine provided a phone number for friend-Patrick Micombero [phone]), with whom also works with the Pt. The Pt. accepted Patrick's invitation to join any medical team meetings by phone with the Pt.   This chaplain is available for F/U spiritual care as needed.  Chaplain Stephanie Acre [phone]

## 2022-11-21 ENCOUNTER — Inpatient Hospital Stay (HOSPITAL_COMMUNITY): Payer: BC Managed Care – PPO

## 2022-11-21 DIAGNOSIS — Z515 Encounter for palliative care: Secondary | ICD-10-CM

## 2022-11-21 DIAGNOSIS — R634 Abnormal weight loss: Secondary | ICD-10-CM

## 2022-11-21 DIAGNOSIS — I517 Cardiomegaly: Secondary | ICD-10-CM | POA: Diagnosis not present

## 2022-11-21 DIAGNOSIS — E875 Hyperkalemia: Secondary | ICD-10-CM

## 2022-11-21 DIAGNOSIS — Z7189 Other specified counseling: Secondary | ICD-10-CM

## 2022-11-21 DIAGNOSIS — G893 Neoplasm related pain (acute) (chronic): Secondary | ICD-10-CM

## 2022-11-21 LAB — COMPREHENSIVE METABOLIC PANEL
ALT: 19 U/L (ref 0–44)
AST: 24 U/L (ref 15–41)
Albumin: 2 g/dL — ABNORMAL LOW (ref 3.5–5.0)
Alkaline Phosphatase: 500 U/L — ABNORMAL HIGH (ref 38–126)
Anion gap: 5 (ref 5–15)
BUN: 16 mg/dL (ref 8–23)
CO2: 26 mmol/L (ref 22–32)
Calcium: 8.4 mg/dL — ABNORMAL LOW (ref 8.9–10.3)
Chloride: 100 mmol/L (ref 98–111)
Creatinine, Ser: 0.8 mg/dL (ref 0.61–1.24)
GFR, Estimated: >60 mL/min (ref >60)
Glucose, Bld: 128 mg/dL — ABNORMAL HIGH (ref 70–99)
Potassium: 5.3 mmol/L — ABNORMAL HIGH (ref 3.5–5.1)
Sodium: 131 mmol/L — ABNORMAL LOW (ref 135–145)
Total Bilirubin: 0.4 mg/dL (ref 0.3–1.2)
Total Protein: 8.1 g/dL (ref 6.5–8.1)

## 2022-11-21 LAB — CBC
HCT: 37.3 % — ABNORMAL LOW (ref 39.0–52.0)
Hemoglobin: 11.8 g/dL — ABNORMAL LOW (ref 13.0–17.0)
MCH: 26.9 pg (ref 26.0–34.0)
MCHC: 31.6 g/dL (ref 30.0–36.0)
MCV: 85 fL (ref 80.0–100.0)
Platelets: 194 10*3/uL (ref 150–400)
RBC: 4.39 MIL/uL (ref 4.22–5.81)
RDW: 13.5 % (ref 11.5–15.5)
WBC: 10.2 10*3/uL (ref 4.0–10.5)
nRBC: 0.2 % (ref 0.0–0.2)

## 2022-11-21 LAB — ECHOCARDIOGRAM COMPLETE
AR max vel: 3.57 cm2
AV Area VTI: 3.56 cm2
AV Area mean vel: 3.4 cm2
AV Mean grad: 3 mmHg
AV Peak grad: 4.6 mmHg
Ao pk vel: 1.07 m/s
Area-P 1/2: 3.6 cm2
Height: 69 in
S' Lateral: 2.6 cm
Weight: 1804.8 [oz_av]

## 2022-11-21 LAB — BASIC METABOLIC PANEL
Anion gap: 8 (ref 5–15)
BUN: 18 mg/dL (ref 8–23)
CO2: 29 mmol/L (ref 22–32)
Calcium: 8 mg/dL — ABNORMAL LOW (ref 8.9–10.3)
Chloride: 95 mmol/L — ABNORMAL LOW (ref 98–111)
Creatinine, Ser: 0.84 mg/dL (ref 0.61–1.24)
GFR, Estimated: >60 mL/min (ref >60)
Glucose, Bld: 172 mg/dL — ABNORMAL HIGH (ref 70–99)
Potassium: 4.3 mmol/L (ref 3.5–5.1)
Sodium: 132 mmol/L — ABNORMAL LOW (ref 135–145)

## 2022-11-21 LAB — VITAMIN D 25 HYDROXY (VIT D DEFICIENCY, FRACTURES): Vit D, 25-Hydroxy: 33.22 ng/mL (ref 30–100)

## 2022-11-21 LAB — PHOSPHORUS: Phosphorus: 3.5 mg/dL (ref 2.5–4.6)

## 2022-11-21 LAB — MAGNESIUM: Magnesium: 2 mg/dL (ref 1.7–2.4)

## 2022-11-21 MED ORDER — SODIUM ZIRCONIUM CYCLOSILICATE 10 G PO PACK
10.0000 g | PACK | Freq: Once | ORAL | Status: AC
  Administered 2022-11-21: 10 g via ORAL
  Filled 2022-11-21: qty 1

## 2022-11-21 MED ORDER — IOHEXOL 350 MG/ML SOLN
75.0000 mL | Freq: Once | INTRAVENOUS | Status: AC | PRN
  Administered 2022-11-21: 75 mL via INTRAVENOUS

## 2022-11-21 NOTE — Assessment & Plan Note (Deleted)
Abdominal discomfort and urinary symptoms along with back pain

## 2022-11-21 NOTE — Assessment & Plan Note (Addendum)
PSA elevated to 1444.22. 2 years of B symptoms which worsened over the past month. Previous multiple myeloma panel in 2021 with elevated IgG and IgM, elevated total protein 9.6>10.2, and elevated alkaline phosphatase 560, after which he was lost to follow up. 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 With his associated symptoms likely metastatic prostate cancer with extensive skeletal lesions, likely lung metastases and mediastinal adenopathy. - Regular diet and Ensure - PT/OT to treat - Labs: Quantiferon-Gold, BMP - Imaging: Echo pending - Multiple myeloma panel pending - Oncology consulted, appreciate recs: - CT of the abdomen and pelvis and a whole-body bone scan to evaluate his abd discomfort and urinary symptoms - Once CT-guided biopsy of abdomen and pelvis is done we will need CT-guided biopsy of one of the accessible lesions to confirm tissue diagnosis. 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 with high risk of hypocalcemia when started on bone directed therapies (bisphosphonate or Xgeva) - Start Casodex 50 mg PO daily x2 weeks (9/14-9/28) and then switch to [facility] upon oncology clinic follow up - Start Lupron 7.5 mg IM, due to no evidence of cord compression - Dental evaluation d/t poor dental dentition and dental hygiene and concern w/ starting bisphosphonate or Xgeva at this time prior to initiating therapy - If patient gets hypercalcemic -- immediately proceed with bisphosphonate/Xgeva without dental evaluation - Significant skeletal discomfort: start tentatively on dexamethasone 4 mg PO BID for 1 week (9/13-9/20) ---> 4 mg PO daily for 1 week (9/21-9/28) ---> 2 mg PO daily for 1 week (9/29-10/6) ---> stop - RD consult to optimize nutritional status and start on appropriate supplementation - Follow up outpatient at cancer  center in 2 weeks. - Cancer Related Pain: Tylenol 1000 mg q8h, Lidoderm patch, Oxycodone 10 mg q4h PRN

## 2022-11-21 NOTE — Plan of Care (Signed)

## 2022-11-21 NOTE — Assessment & Plan Note (Addendum)
Currently has no insurance and has difficulty with transportation. Language barrier is also affecting his care.  - TOC for Medicaid assistance and transportation - FMLA paperwork for work and financial support - Consulted spiritual and palliative to discuss diagnosis and options further, appreciate assistance and recommendations

## 2022-11-21 NOTE — Progress Notes (Addendum)
Daily Progress Note Intern Pager: [phone]  Patient name: Johnell Newsham Medical record number: [id number] Date of birth: 06-30-1960 Age: 62 y.o. Gender: male  Primary Care Provider: [name], MD Consultants: Oncology Code Status: Full Code  Pt Overview and Major Events to Date:  9/12: Admitted 9/13: Started Casodex (9/13-9/27), Decadron 4 mg (9/13-9/20) and Vit D 50,000u weekly 9/14: Lupron and Echo  Assessment and Plan: Fillip Boyadjian is a 62 year old male presenting with B symptoms with CTA confirming widespread sclerotic metastatic disease throughout the axial skeleton.  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 2 years of B symptoms which worsened over the past month. Previous multiple myeloma panel in 2021 with elevated IgG and IgM, elevated total protein 9.6>10.2, and elevated alkaline phosphatase 560, after which he was lost to follow up. 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 With his associated symptoms likely metastatic prostate cancer with extensive skeletal lesions, likely lung metastases and mediastinal adenopathy. - Regular diet and Ensure - PT/OT to treat - Labs: Quantiferon-Gold, BMP - Imaging: Echo pending - Multiple myeloma panel pending - Oncology consulted, appreciate recs: - CT of the abdomen and pelvis and a whole-body bone scan to evaluate his abd discomfort and urinary symptoms - Once CT-guided biopsy of abdomen and pelvis is done we will need CT-guided biopsy of one of the accessible lesions to confirm tissue diagnosis. 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 with high risk of hypocalcemia when started on bone directed therapies (bisphosphonate or Xgeva) - Start Casodex 50 mg PO daily x2 weeks (9/14-9/28) and then switch to  [facility] upon oncology clinic follow up - Start Lupron 7.5 mg IM, due to no evidence of cord compression - Dental evaluation d/t poor dental dentition and dental hygiene and concern w/ starting bisphosphonate or Xgeva at this time prior to initiating therapy - If patient gets hypercalcemic -- immediately proceed with bisphosphonate/Xgeva without dental evaluation - Significant skeletal discomfort: start tentatively on dexamethasone 4 mg PO BID for 1 week (9/13-9/20) ---> 4 mg PO daily for 1 week (9/21-9/28) ---> 2 mg PO daily for 1 week (9/29-10/6) ---> stop - RD consult to optimize nutritional status and start on appropriate supplementation - Follow up outpatient at cancer center in 2 weeks. - Cancer Related Pain: Tylenol 1000 mg q8h, Lidoderm patch, Oxycodone 10 mg q4h PRN Goals of care, counseling/discussion Currently has no insurance and has difficulty with transportation. Language barrier is also affecting his care.  - TOC for Medicaid assistance and transportation - FMLA paperwork for work and financial support - Consulted spiritual and palliative to discuss diagnosis and options further, appreciate assistance and recommendations Hyperkalemia K 5.3 - Ordered Lokelma x 1 - PM BMP  Chronic and Stable Problems: none   FEN/GI: Regular Diet PPx: Lovenox Dispo: pending oncology recs  Subjective:  Had a long cessation at bedside with palliative Amil Amen) and patient's friend Teresifora (as interpreter) at bedside. Discussed his diagnosis further and oncology recommendations. Patient seems to still have an unclear understanding of his diagnosis and the prognosis is still to be determined. Seems he still didn't understand that he had cancer and may have been in shock after receiving the news according to his friend, not sure how much he understood from the conversation Dr. Candise Che had with him.   Patient  shares that his pain has improved with the pain medication, but continues to persist. He is  otherwise eating his meals, taking his meds, and resting comfortably in bed. He had questions regarding if and when he would be able to return to work with this diagnosis and if he would lose his job due to being out of work, we discussed that we would get him paperwork to help and that there are laws that prevent companies from taking jobs from people due to medical illness and shared that we are working towards getting resources as able to afford his expenses. Patient understands and is appreciative.   Objective: Temp:  [97.6 F (36.4 C)-98.4 F (36.9 C)] 97.8 F (36.6 C) (09/14 0435) Pulse Rate:  [79-87] 79 (09/14 0435) Resp:  [16-20] 16 (09/14 0435) BP: (109-127)/(64-92) 127/84 (09/14 0435) SpO2:  [92 %-97 %] 92 % (09/14 0435) Physical Exam: General: Cachectic, NAD, awake and alert HEENT: Normocephalic, atraumatic. Sunken cheeks. Conjunctiva normal. No nasal discharge. Poor dentition Cardiovascular: RRR. No M/R/G. Mild TTP over sternum Respiratory: Diminished breath sounds, normal WOB on RA. No wheezing, crackles, rhonchi, or diminished breath sounds. Abdomen: Soft, non-tender, non-distended. Bowel sounds normoactive Extremities: Moves all extremities. Mild TTP over thoracic spine. No BLE edema, no deformities or significant joint findings. Skin: Warm and dry. Neuro: A&Ox3. No focal neurological deficits.  Laboratory: Most recent CBC Lab Results  Component Value Date   WBC 10.2 11/21/2022   HGB 11.8 (L) 11/21/2022   HCT 37.3 (L) 11/21/2022   MCV 85.0 11/21/2022   PLT 194 11/21/2022   Most recent BMP    Latest Ref Rng & Units 11/21/2022    4:11 AM  BMP  Glucose 70 - 99 mg/dL 409   BUN 8 - 23 mg/dL 16   Creatinine 8.11 - 1.24 mg/dL 9.14   Sodium 782 - 956 mmol/L 131   Potassium 3.5 - 5.1 mmol/L 5.3   Chloride 98 - 111 mmol/L 100   CO2 22 - 32 mmol/L 26   Calcium 8.9 - 10.3 mg/dL 8.4     Vitamin D: 21.30 Phos: 3.1>3.2>3.5 Mag: 1.8>1.9>2.0 Alk phos: 500 Albumin:  2.0>1.8>2.0 Calcium: 8.6>7.9>8.4  Imaging/Diagnostic Tests: CT Abdomen/Pelvis w/ Contrast 1. Diffuse mottled sclerosis throughout the visualized bony structures, compatible with diffuse bony metastatic disease in this patient with a history of prostate cancer. 2. Left renal cortical atrophy, with mild left-sided obstructive uropathy. The exact source of obstruction is not well visualized, but could be due to an enlarged prostate causing mass effect at the left UVJ. 3. Borderline enlarged retroperitoneal and pelvic adenopathy as above, metastatic disease not excluded. 4. Heterogeneous enlarged prostate. 5. Aortic Atherosclerosis (ICD10-I70.0). 6. Marked cachexia.  Echo LVEF: 60-65%, LV demonstrate Grade I diastolic dysfunction. Mild calcification and thickening of the aortic valve.   Fortunato Curling, DO 11/21/2022, 8:29 AM PGY-1, [facility]  FPTS Intern pager: [phone], text pages welcome Secure chat group [facility] Teaching Service

## 2022-11-21 NOTE — Assessment & Plan Note (Deleted)
2 years of B symptoms which worsened over the past month. Previous multiple myeloma panel in 2021 with elevated IgG and IgM, elevated total protein 9.6>10.2, and elevated alkaline phosphatase 560, after which he was lost to follow up. 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 PSA [id number]. Oncology consulted due to concern for metastatic prostate cancer.  - Regular diet and Ensure - PT/OT to treat - Labs: Quantiferon-Gold, CBC, CMP, Mag, Phos - Imaging: Echo - Multiple myeloma panel - Oncology consulted, appreciate recs - Pain: Tylenol 1000 mg q8h, Lidoderm patch, Oxycodone 10 mg q4h PRN

## 2022-11-21 NOTE — Assessment & Plan Note (Addendum)
K 5.3 - Ordered Lokelma x 1 - PM BMP

## 2022-11-22 DIAGNOSIS — C61 Malignant neoplasm of prostate: Secondary | ICD-10-CM | POA: Diagnosis not present

## 2022-11-22 LAB — BASIC METABOLIC PANEL
Anion gap: 7 (ref 5–15)
BUN: 22 mg/dL (ref 8–23)
CO2: 30 mmol/L (ref 22–32)
Calcium: 8.1 mg/dL — ABNORMAL LOW (ref 8.9–10.3)
Chloride: 94 mmol/L — ABNORMAL LOW (ref 98–111)
Creatinine, Ser: 0.87 mg/dL (ref 0.61–1.24)
GFR, Estimated: >60 mL/min (ref >60)
Glucose, Bld: 122 mg/dL — ABNORMAL HIGH (ref 70–99)
Potassium: 4.3 mmol/L (ref 3.5–5.1)
Sodium: 131 mmol/L — ABNORMAL LOW (ref 135–145)

## 2022-11-22 LAB — CBC
HCT: 33.6 % — ABNORMAL LOW (ref 39.0–52.0)
Hemoglobin: 10.9 g/dL — ABNORMAL LOW (ref 13.0–17.0)
MCH: 27.8 pg (ref 26.0–34.0)
MCHC: 32.4 g/dL (ref 30.0–36.0)
MCV: 85.7 fL (ref 80.0–100.0)
Platelets: 215 10*3/uL (ref 150–400)
RBC: 3.92 MIL/uL — ABNORMAL LOW (ref 4.22–5.81)
RDW: 13.4 % (ref 11.5–15.5)
WBC: 9.2 10*3/uL (ref 4.0–10.5)
nRBC: 0.2 % (ref 0.0–0.2)

## 2022-11-22 MED ORDER — SENNA 8.6 MG PO TABS
1.0000 | ORAL_TABLET | Freq: Every day | ORAL | Status: DC
  Administered 2022-11-22 – 2022-11-23 (×2): 8.6 mg via ORAL
  Filled 2022-11-22 (×2): qty 1

## 2022-11-22 MED ORDER — POLYETHYLENE GLYCOL 3350 17 G PO PACK
17.0000 g | PACK | Freq: Two times a day (BID) | ORAL | Status: DC
  Administered 2022-11-22 – 2022-11-23 (×2): 17 g via ORAL
  Filled 2022-11-22 (×2): qty 1

## 2022-11-22 MED ORDER — ENSURE ENLIVE PO LIQD
237.0000 mL | Freq: Three times a day (TID) | ORAL | Status: DC
  Administered 2022-11-22 – 2022-11-26 (×9): 237 mL via ORAL

## 2022-11-22 NOTE — Assessment & Plan Note (Signed)
Currently has no insurance and has difficulty with transportation. Language barrier is also affecting his care.  - TOC for Medicaid assistance and transportation - FMLA paperwork for work and financial support - Consulted spiritual and palliative to discuss diagnosis and options further, appreciate assistance and recommendations

## 2022-11-22 NOTE — Assessment & Plan Note (Signed)
Oxycodone 10 mg every 4 as needed with Tylenol

## 2022-11-22 NOTE — Assessment & Plan Note (Signed)
Previous multiple myeloma panel in 2021 with elevated IgG and IgM, elevated total protein 9.6>10.2, and elevated alkaline phosphatase 560, after which he was lost to follow up.  CT abdomen and pelvis with contrast showed diffuse mottled sclerosis throughout visualized bony structures with left renal cortical atrophy and mild left sided obstructive uropathy.  - Regular diet and Ensure - PT/OT to treat - Labs: Quantiferon-Gold, BMP - Imaging: Echo pending - Multiple myeloma panel pending - Oncology consulted, appreciate recs: - Once CT-guided biopsy of abdomen and pelvis is done we will need CT-guided biopsy of one of the accessible lesions to confirm tissue diagnosis. 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 with high risk of hypocalcemia when started on bone directed therapies (bisphosphonate or Xgeva) - Started  Casodex 50 mg PO daily x2 weeks (9/14-9/28) and then switch to [facility] upon oncology clinic follow up - Start Lupron 7.5 mg IM, due to no evidence of cord compression - Dental evaluation d/t poor dental dentition and dental hygiene and concern w/ starting bisphosphonate or Xgeva at this time prior to initiating therapy - If patient gets hypercalcemic -- immediately proceed with bisphosphonate/Xgeva without dental evaluation - Significant skeletal discomfort: start tentatively on dexamethasone 4 mg PO BID for 1 week (9/13-9/20) ---> 4 mg PO daily for 1 week (9/21-9/28) ---> 2 mg PO daily for 1 week (9/29-10/6) ---> stop - RD consult to optimize nutritional status and start on appropriate supplementation - Follow up outpatient at cancer center in 2 weeks. -TB pending, airborne precautions  - Cancer Related Pain: Tylenol 1000 mg q8h, Lidoderm patch, Oxycodone 10 mg q4h PRN

## 2022-11-22 NOTE — Progress Notes (Signed)
Initial Nutrition Assessment  DOCUMENTATION CODES:   Underweight  INTERVENTION:   Encourage PO intake  HS snack  Ensure Enlive po TID, each supplement provides 350 kcal and 20 grams of protein.  Magic cup TID with meals, each supplement provides 290 kcal and 9 grams of protein   NUTRITION DIAGNOSIS:   Increased nutrient needs related to cancer and cancer related treatments as evidenced by estimated needs.  GOAL:   Patient will meet greater than or equal to 90% of their needs  MONITOR:   PO intake, Supplement acceptance  REASON FOR ASSESSMENT:   Consult Assessment of nutrition requirement/status  ASSESSMENT:   Pt with PMH of HTN, chronic bronchitis, and back pain who presented to [facility] Medicine clinic with chest pain, nonproductive cough, worsening dyspnea, night sweats, and unintentional weight loss. He was a direct admit for work up. PSA elevated 1444.22 and CTA confirming widespread sclerotic metastatic disease, oncology consulted for concern for metastatic prostate cancer. TB pending.   Per Palliative care pt is a refugee from [place], moved to the [place] in 2021, no family as they were lost due to war. Pt lives alone in an apartment. Pt is using a friend, Teresifora, as an Equities trader.  Meal Completion: 60-100% Per chart review pt's weight in 2021 was 62-65 kg Current weight at clinic 51.2 kg which would represent ~ 19% weight loss in unknown time frame.   9/13 - admitted  Medications reviewed and include: decadron, ensure enlive BID, miralax, Vitamin D 73710 weekly, miralax   Labs reviewed:  Na 131    NUTRITION - FOCUSED PHYSICAL EXAM:  Defer until follow up  Diet Order:   Diet Order             Diet regular Room service appropriate? Yes; Fluid consistency: Thin  Diet effective now                   EDUCATION NEEDS:   Not appropriate for education at this time  Skin:  Skin Assessment: Reviewed RN Assessment  Last BM:  unknown  Height:    Ht Readings from Last 1 Encounters:  11/19/22 5\' 9"  (1.753 m)    Weight:   Wt Readings from Last 1 Encounters:  11/19/22 51.2 kg    BMI:  Body mass index is 16.66 kg/m.  Estimated Nutritional Needs:   Kcal:  1700-2000  Protein:  80-100 grams  Fluid:  >1.7 L/day  Cammy Copa., RD, LDN, CNSC See AMiON for contact information

## 2022-11-22 NOTE — Assessment & Plan Note (Signed)
Resolved after Lokelma, continue to monitor

## 2022-11-22 NOTE — Progress Notes (Signed)
Daily Progress Note Intern Pager: [phone]  Patient name: Arthur Ali Medical record number: [id number] Date of birth: 02/19/1961 Age: 62 y.o. Gender: male  Primary Care Provider: [name], MD Consultants: Oncology Code Status: Full  Utilized phone interpreter for [place]   Pt Overview and Major Events to Date:  9/12: Admitted to FMTS 9/13: Started Casodex (9/13-9/27), Decadron 4 mg (9/13-9/20) and Vit D 50,000u weekly 9/14: On Lupron   Assessment and Plan:  Melvern Ali is a 62 year old male who presented initially with B symptoms and had CTA that confirmed widespread sclerotic metastatic disease throughout the axial skeleton.  He additionally had a PSA of >1400.  Oncology was consulted. Assessment & Plan Prostate cancer metastatic to multiple sites [facility]) Previous multiple myeloma panel in 2021 with elevated IgG and IgM, elevated total protein 9.6>10.2, and elevated alkaline phosphatase 560, after which he was lost to follow up.  CT abdomen and pelvis with contrast showed diffuse mottled sclerosis throughout visualized bony structures with left renal cortical atrophy and mild left sided obstructive uropathy.  - Regular diet and Ensure - PT/OT to treat - Labs: Quantiferon-Gold, BMP - Imaging: Echo pending - Multiple myeloma panel pending - Oncology consulted, appreciate recs: - Once CT-guided biopsy of abdomen and pelvis is done we will need CT-guided biopsy of one of the accessible lesions to confirm tissue diagnosis. 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 with high risk of hypocalcemia when started on bone directed therapies (bisphosphonate or Xgeva) - Started  Casodex 50 mg PO daily x2 weeks (9/14-9/28) and then switch to [facility] upon oncology clinic follow up - Start Lupron 7.5 mg IM, due to no evidence of cord compression - Dental evaluation d/t poor dental dentition and dental  hygiene and concern w/ starting bisphosphonate or Xgeva at this time prior to initiating therapy - If patient gets hypercalcemic -- immediately proceed with bisphosphonate/Xgeva without dental evaluation - Significant skeletal discomfort: start tentatively on dexamethasone 4 mg PO BID for 1 week (9/13-9/20) ---> 4 mg PO daily for 1 week (9/21-9/28) ---> 2 mg PO daily for 1 week (9/29-10/6) ---> stop - RD consult to optimize nutritional status and start on appropriate supplementation - Follow up outpatient at cancer center in 2 weeks. -TB pending, airborne precautions  - Cancer Related Pain: Tylenol 1000 mg q8h, Lidoderm patch, Oxycodone 10 mg q4h PRN Goals of care, counseling/discussion Currently has no insurance and has difficulty with transportation. Language barrier is also affecting his care.  - TOC for Medicaid assistance and transportation - FMLA paperwork for work and financial support - Consulted spiritual and palliative to discuss diagnosis and options further, appreciate assistance and recommendations Hyperkalemia Resolved after Lokelma, continue to monitor Cancer related pain Oxycodone 10 mg every 4 as needed with Tylenol   Chronic and Stable Issues: None  FEN/GI: Regular diet with RD on board PPx: Lovenox Dispo: Several days pending oncology recommendations, unsure what can be done outpatient versus inpatient but will defer to oncology expertise.  Subjective:  Patient reports that he still has his pain that he presented with in the chest and back but overall has improved  Objective: Temp:  [96.3 F (35.7 C)-98.4 F (36.9 C)] 96.3 F (35.7 C) (09/15 0359) Pulse Rate:  [75-83] 75 (09/15 0359) Resp:  [18] 18 (09/15 0359) BP: (136-148)/(89-93) 148/89 (09/15 0359) SpO2:  [96 %-99 %] 99 % (09/15 0359) Physical Exam: General: Cachectic, frail,  no acute distress Cardiovascular: Regular rate and rhythm without murmur gallop or rub Respiratory: Normal work of breathing on  room air, no wheezing Abdomen: Nontender to palpation Extremities: Significantly cachectic, able to move all extremities  Laboratory: Most recent CBC Lab Results  Component Value Date   WBC 9.2 11/22/2022   HGB 10.9 (L) 11/22/2022   HCT 33.6 (L) 11/22/2022   MCV 85.7 11/22/2022   PLT 215 11/22/2022   Most recent BMP    Latest Ref Rng & Units 11/22/2022    2:50 AM  BMP  Glucose 70 - 99 mg/dL 169   BUN 8 - 23 mg/dL 22   Creatinine 6.78 - 1.24 mg/dL 9.38   Sodium 101 - 751 mmol/L 131   Potassium 3.5 - 5.1 mmol/L 4.3   Chloride 98 - 111 mmol/L 94   CO2 22 - 32 mmol/L 30   Calcium 8.9 - 10.3 mg/dL 8.1     Arthur Martinez, MD 11/22/2022, 9:29 AM  PGY-3, [facility] FPTS Intern pager: [phone], text pages welcome Secure chat group [facility] Teaching Service

## 2022-11-22 NOTE — Plan of Care (Signed)
  Problem: Activity: Goal: Risk for activity intolerance will decrease Outcome: Progressing   Problem: Nutrition: Goal: Adequate nutrition will be maintained Outcome: Progressing   Problem: Coping: Goal: Level of anxiety will decrease Outcome: Progressing   Problem: Elimination: Goal: Will not experience complications related to urinary retention Outcome: Progressing   

## 2022-11-22 NOTE — Plan of Care (Signed)
  Problem: Activity: Goal: Risk for activity intolerance will decrease Outcome: Progressing   Problem: Elimination: Goal: Will not experience complications related to bowel motility Outcome: Progressing   Problem: Pain Managment: Goal: General experience of comfort will improve Outcome: Progressing   Problem: Elimination: Goal: Will not experience complications related to urinary retention Outcome: Completed/Met

## 2022-11-22 NOTE — Consult Note (Signed)
Palliative Care Consult Note                                  Date: 11/22/2022   Patient Name: Arthur Ali  DOB: 1960-03-25  MRN: [id number]  Age / Sex: 62 y.o., male  PCP: [name], MD Referring Physician: Billey Co, MD  Reason for Consultation: Establishing goals of care  HPI/Patient Profile: 62 y.o. male  with past medical history of hypertension, chronic bronchitis, and back pain who presented to the [facility] on 11/19/2022 with multiple complaints including chest pain, nonproductive cough, worsening dyspnea, night sweats, and unintentional weight loss.  He was a direct admit to [facility] for further workup and management.  Found to have elevated PSA (8,756.43) and CTA confirming widespread sclerotic metastatic disease throughout the axial skeleton.  Oncology consulted due to concern for metastatic prostate cancer. Palliative Medicine consulted for GOC.    Subjective:   I have reviewed medical records including EPIC notes, labs and imaging  Along with Dr. Fatima Blank, I met with patient at bedside to discuss diagnosis, prognosis, and GOC.  There is a friend in the room, named Teresifora.  He shares that the patient is "like a father" to him. Teresifora speaks [place] and served as interpreter per patient request. We also utilized a phone interpreter as back-up.   I introduced Palliative Medicine as specialized medical care for people living with serious illness. It focuses on providing relief from the symptoms and stress of a serious illness.   Created space and opportunity for patient and friend to express thoughts and feelings regarding current medical situation. Values and goals of care were attempted to be elicited.  Social History: Patient is a refugee from [place]. He came to the [place] in 2021. He has no family; reporting they were lost due to war in his country..   Patient lives alone in his  apartment. Working in a manual labor job prior to admission.   Patient is enrolled in the [facility] [employer] [facility] Program, which can provide assistance with transportation to medical appointments.   GOC Discussion: We discussed patient's current illness and what it means in the larger context of his ongoing co-morbidities. Current clinical status was reviewed.   Reviewed patient's  to oncologist Dr. Candise Che yesterday. He reports feeling "shocked" by the news that he has cancer.   Much of our discussion was with his friend Teresifora. We discussed with him the concern for metastatic cancer, which would be a noncurable condition. Explained that biopsy is needed to confirm the diagnosis and for disease staging.  Encouraged patient and his friend to consider anticipatory care planning to outline what medical interventions patient would or would not want in the event his condition declines, keeping in mind the concept of quality of life.    Patient's main question at this time is when he will be able to return to work. He expresses significant financial concerns, with not being able to pay his bills (including rent) next month. Provided emotional support and reassurance that the care team is working to obtain any available resources.   Discussed the importance of continued conversation with patient and his medical providers regarding overall plan of care and treatment options.   Review of Systems  Constitutional:  Positive for appetite change.  Musculoskeletal:  Positive for back pain.    Objective:   Primary Diagnoses: Present on Admission: **None**  Physical Exam Vitals reviewed.  Constitutional:      General: He is not in acute distress.    Appearance: He is ill-appearing.  Pulmonary:     Effort: No respiratory distress.  Neurological:     Mental Status: He is alert and oriented to person, place, and time.     Vital Signs:  BP (!) 148/89 (BP Location: Left Arm)   Pulse 75    Temp (!) 96.3 F (35.7 C) (Axillary)   Resp 18   Ht 5\' 9"  (1.753 m)   SpO2 99%   BMI 16.66 kg/m   Palliative Assessment/Data: PPS 50%     Assessment & Plan:   SUMMARY OF RECOMMENDATIONS   Continue current work-up and supportive care PMT will continue to follow Referral to outpatient Pall[facility] Care Clinic at [facility]  , Primary Decision Maker: Patient seems to have limited insight into his current medical situation, likely due to language barrier as well as low health literacy Patient has no next of kin I plan to explore if patient wants to designate his friend Teresifora as Product manager   Code Status/Advance Care Planning: Full code  Symptom Management: Per primary team Scheduled Tylenol 1000 mg every 8 hours Lidocaine 5% patch Oxycodone IR 10 mg every 4 hours as needed for pain Dexamethasone 4 mg every 12 hours Bowel regimen - MiraLAX 17 g daily  Prognosis:  Unable to determine  Discharge Planning:  To Be Determined    Thank you for allowing [place] to participate in the care of Arthur Ali   Time Total: 75 minutes  Greater than 50%  of this time was spent counseling and coordinating care related to the above assessment and plan.  Signed by: Sherlean Foot, NP Palliative Medicine Team  Team Phone # [phone]  For individual providers, please see AMION

## 2022-11-23 DIAGNOSIS — C61 Malignant neoplasm of prostate: Secondary | ICD-10-CM | POA: Diagnosis not present

## 2022-11-23 LAB — BASIC METABOLIC PANEL
Anion gap: 9 (ref 5–15)
BUN: 18 mg/dL (ref 8–23)
CO2: 28 mmol/L (ref 22–32)
Calcium: 8.3 mg/dL — ABNORMAL LOW (ref 8.9–10.3)
Chloride: 96 mmol/L — ABNORMAL LOW (ref 98–111)
Creatinine, Ser: 0.81 mg/dL (ref 0.61–1.24)
GFR, Estimated: >60 mL/min (ref >60)
Glucose, Bld: 117 mg/dL — ABNORMAL HIGH (ref 70–99)
Potassium: 4.6 mmol/L (ref 3.5–5.1)
Sodium: 133 mmol/L — ABNORMAL LOW (ref 135–145)

## 2022-11-23 LAB — CBC
HCT: 34.9 % — ABNORMAL LOW (ref 39.0–52.0)
Hemoglobin: 11.2 g/dL — ABNORMAL LOW (ref 13.0–17.0)
MCH: 26.9 pg (ref 26.0–34.0)
MCHC: 32.1 g/dL (ref 30.0–36.0)
MCV: 83.9 fL (ref 80.0–100.0)
Platelets: 242 10*3/uL (ref 150–400)
RBC: 4.16 MIL/uL — ABNORMAL LOW (ref 4.22–5.81)
RDW: 13.6 % (ref 11.5–15.5)
WBC: 10.7 10*3/uL — ABNORMAL HIGH (ref 4.0–10.5)
nRBC: 0.3 % — ABNORMAL HIGH (ref 0.0–0.2)

## 2022-11-23 LAB — KAPPA/LAMBDA LIGHT CHAINS
Kappa free light chain: 112.3 mg/L — ABNORMAL HIGH (ref 3.3–19.4)
Kappa, lambda light chain ratio: 0.88 (ref 0.26–1.65)
Lambda free light chains: 127.9 mg/L — ABNORMAL HIGH (ref 5.7–26.3)

## 2022-11-23 MED ORDER — POLYETHYLENE GLYCOL 3350 17 G PO PACK
34.0000 g | PACK | Freq: Two times a day (BID) | ORAL | Status: DC
  Administered 2022-11-23 – 2022-11-26 (×6): 34 g via ORAL
  Filled 2022-11-23 (×6): qty 2

## 2022-11-23 MED ORDER — SENNA 8.6 MG PO TABS
1.0000 | ORAL_TABLET | Freq: Two times a day (BID) | ORAL | Status: DC
  Administered 2022-11-23 – 2022-11-26 (×6): 8.6 mg via ORAL
  Filled 2022-11-23 (×6): qty 1

## 2022-11-23 NOTE — Assessment & Plan Note (Addendum)
Patient has been lost to follow up for some time. Previous multiple myeloma panel in 2021 with elevated IgG and IgM, elevated total protein 9.5>10.2>7.9>7.2>8.1, and elevated alkaline phosphatase 560>503>497>500. CT abdomen and pelvis with contrast showed diffuse mottled sclerosis throughout visualized bony structures with left renal cortical atrophy and mild left sided obstructive uropathy.  - Regular diet and Ensure, RD on board - PT/OT to treat - Labs: Quantiferon-Gold, Multiple myeloma panel pending - TB pending, airborne precautions  - Imaging: Echo pending - Oncology consulted, appreciate recs: - Once CT-guided biopsy of abdomen and pelvis is done we will need CT-guided biopsy of one of the accessible lesions to confirm tissue diagnosis. Consult IR. - If biopsy site not available: treat based on clinical picture and elevated PSA levels & do outpatient Guardant36- for molecular profiling - Vit D level and start on high-dose Vit D 50,000u weekly for very significant hungry bone syndrome with high risk of hypocalcemia when started on bone directed therapies (bisphosphonate or Xgeva); dental eval first to prevent osteonecrosis of the jaw - If patient gets hypercalcemic -- immediately proceed with bisphosphonate/Xgeva without dental evaluation - Started  Casodex 50 mg PO daily x2 weeks (9/14-9/28) and then switch to [facility] upon oncology clinic follow up - Dexamethasone taper: 4 mg PO BID for 1 week (9/13-9/20) ---> 4 mg PO daily for 1 week (9/21-9/28) ---> 2 mg PO daily for 1 week (9/29-10/6) ---> stop

## 2022-11-23 NOTE — Progress Notes (Signed)
Daily Progress Note Intern Pager: [phone]  Patient name: Arthur Ali Medical record number: [id number] Date of birth: 1960/07/28 Age: 62 y.o. Gender: male  Primary Care Provider: [name], MD Consultants: Oncology Code Status: Full Code  Pt Overview and Major Events to Date:  9/12: Admitted to FMTS 9/13: Started Casodex (9/13-9/27), Decadron 4 mg (9/13-9/20) and Vit D 50,000u weekly 9/14: On Lupron  9/16: IR consulted for possible biopsy  Assessment and Plan: Arthur Ali is a 62 year old male who presented initially with B symptoms and had CTA that confirmed widespread sclerotic metastatic disease throughout the axial skeleton.  He additionally had a PSA of >1400.  Oncology was consulted. Assessment & Plan Prostate cancer metastatic to multiple sites [facility]) Patient has been lost to follow up for some time. Previous multiple myeloma panel in 2021 with elevated IgG and IgM, elevated total protein 9.5>10.2>7.9>7.2>8.1, and elevated alkaline phosphatase 560>503>497>500. CT abdomen and pelvis with contrast showed diffuse mottled sclerosis throughout visualized bony structures with left renal cortical atrophy and mild left sided obstructive uropathy.  - Regular diet and Ensure, RD on board - PT/OT to treat - Labs: Quantiferon-Gold, Multiple myeloma panel pending - TB pending, airborne precautions  - Imaging: Echo pending - Oncology consulted, appreciate recs: - Once CT-guided biopsy of abdomen and pelvis is done we will need CT-guided biopsy of one of the accessible lesions to confirm tissue diagnosis. Consult IR. - If biopsy site not available: treat based on clinical picture and elevated PSA levels & do outpatient Guardant36- for molecular profiling - Vit D level and start on high-dose Vit D 50,000u weekly for very significant hungry bone syndrome with high risk of hypocalcemia when started on bone directed therapies (bisphosphonate or Xgeva); dental eval first to  prevent osteonecrosis of the jaw - If patient gets hypercalcemic -- immediately proceed with bisphosphonate/Xgeva without dental evaluation - Started  Casodex 50 mg PO daily x2 weeks (9/14-9/28) and then switch to [facility] upon oncology clinic follow up - Dexamethasone taper: 4 mg PO BID for 1 week (9/13-9/20) ---> 4 mg PO daily for 1 week (9/21-9/28) ---> 2 mg PO daily for 1 week (9/29-10/6) ---> stop Goals of care, counseling/discussion Currently has no insurance and has difficulty with transportation. Language barrier is also affecting his care.  - TOC for Medicaid assistance and transportation - FMLA paperwork for work and financial support - Consulted spiritual and palliative to discuss diagnosis and options further, appreciate assistance and recommendations Hyperkalemia Resolved after Lokelma, continue to monitor with BMP. Cancer related pain Oxycodone 10 mg every 4 as needed with Tylenol - Bowel regimen: Miralax BID and Senna daily   Chronic and Stable Problems: none   FEN/GI: Regular diet with RD on board PPx: Lovenox Dispo: Several days pending oncology recommendations, unsure what can be done outpatient versus inpatient but will defer to oncology expertise.  Subjective:  Patient is doing well this morning and has no concerns towards his medical condition this AM. He reports that the pain regimen has helped his pain, however he is constipated. He had completed his whole breakfast this morning as well and is glad that his appetite has returned.  Objective: Temp:  [97.7 F (36.5 C)-98.6 F (37 C)] 97.7 F (36.5 C) (09/16 1210) Pulse Rate:  [80-89] 89 (09/16 1210) Resp:  [12-18] 12 (09/16 1210) BP: (126-145)/(81-92) 126/81 (09/16 1210) SpO2:  [95 %-97 %] 95 % (09/16 1210) Physical Exam: General: Cachetic, awake and alert male in NAD HEENT: Normocephalic, atraumatic.  Conjunctiva normal. No nasal discharge. Poor dentition. Cardiovascular: RRR. No M/R/G. Respiratory:  Diminished breath sounds. Normal WOB on RA. No wheezing, crackles, rhonchi Abdomen: Soft, non-tender, non-distended. Bowel sounds normoactive Extremities: Moves all extremities. Tenderness over spine has improved. No BLE edema, no deformities or significant joint findings. Skin: Warm and dry.  Laboratory: Most recent CBC Lab Results  Component Value Date   WBC 10.7 (H) 11/23/2022   HGB 11.2 (L) 11/23/2022   HCT 34.9 (L) 11/23/2022   MCV 83.9 11/23/2022   PLT 242 11/23/2022   Most recent BMP    Latest Ref Rng & Units 11/23/2022    4:31 AM  BMP  Glucose 70 - 99 mg/dL 270   BUN 8 - 23 mg/dL 18   Creatinine 6.23 - 1.24 mg/dL 7.62   Sodium 831 - 517 mmol/L 133   Potassium 3.5 - 5.1 mmol/L 4.6   Chloride 98 - 111 mmol/L 96   CO2 22 - 32 mmol/L 28   Calcium 8.9 - 10.3 mg/dL 8.3    Imaging/Diagnostic Tests: No new imaging.  Fortunato Curling, DO 11/23/2022, 2:10 PM PGY-1, [facility]  FPTS Intern pager: [phone], text pages welcome Secure chat group [facility] Teaching Service

## 2022-11-23 NOTE — Progress Notes (Signed)
Consulted Urology for evaluation of hydronephrosis. Given pt history, they think this is most likely related to his prostate causing obstruction. They recommend hormone suppression therapy (which he is already on from Oncology). They reviewed CTAP and think this is mild hydronephrosis and reassured by pt's good kidney function. They plan to eval pt tomorrow.

## 2022-11-23 NOTE — Progress Notes (Addendum)
Oncology short note  CT abdomen pelvis noted.  Consistent with findings of metastatic prostate cancer.  Appears to have some left-sided urinary obstruction. Recs -Please consult IR regarding most appropriate site of biopsy for tissue diagnosis based on imaging.  -Consider urology consultation to evaluate for role of stenting to address his urinary obstruction. -If no biopsy sites available will treat based on clinical picture and elevated PSA levels and will do outpatient Guardant360 for molecular profiling. -PT OT evaluations to determine discharge needs. ? Need for SNF. -From oncology standpoint other than possible biopsy no other inpatient plans.  He can be discharged on Casodex for 2 weeks upon discharge and his dexamethasone taper. -I will set him up for a clinic follow-up with [place]. -Pain management currently per hospital medicine team.  Will pick this up as outpatient. -Discharge timing per hospital medicine once appropriate discharge needs have been established and the patient is cleared from TB standpoint as well as evaluated regarding need for SNF. -Please call with any other inpatient questions.  Wyvonnia Lora MD MS Hematology/Oncology Physician [facility]

## 2022-11-23 NOTE — Progress Notes (Signed)
Physical Therapy Treatment Patient Details Name: Arthur Ali MRN: [id number] DOB: 11-13-1960 Today's Date: 11/23/2022   History of Present Illness Pt is a 62 y/o Ali admitted on 11/19/22 after presenting with c/o worsening dyspnea, chest pain, night sweats, loss of appetite & unintentional weight loss. 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 PMH: back pain, HTN    PT Comments  Pt admitted with above diagnosis. Pt was able to ambulate in room without much help and without equipment with good safety. Pt should progress well and not need any f/u therapy and doesn't need assistive device currently.  Pt currently with functional limitations due to the deficits listed below (see PT Problem List). Pt will benefit from acute skilled PT to increase their independence and safety with mobility to allow discharge.       If plan is discharge home, recommend the following: Assistance with cooking/housework;Assist for transportation;A little help with walking and/or transfers;A little help with bathing/dressing/bathroom   Can travel by private vehicle        Equipment Recommendations  None recommended by PT    Recommendations for Other Services       Precautions / Restrictions Precautions Precautions: Fall Precaution Comments: airborne/contact Restrictions Weight Bearing Restrictions: No     Mobility  Bed Mobility Overal bed mobility: Modified Independent                  Transfers Overall transfer level: Needs assistance Equipment used: None Transfers: Sit to/from Stand Sit to Stand: Supervision           General transfer comment: STS from EOB without AD    Ambulation/Gait Ambulation/Gait assistance: Supervision Gait Distance (Feet): 80 Feet Assistive device: None   Gait velocity: decreased Gait velocity interpretation: 1.31 - 2.62 ft/sec, indicative of limited community  ambulator   General Gait Details: Pt ambulates to door & back without AD with supervision with slow and steady gait. Pt did not need cues and demonstrates good safety overall.   Stairs             Wheelchair Mobility     Tilt Bed    Modified Rankin (Stroke Patients Only)       Balance Overall balance assessment: Needs assistance Sitting-balance support: No upper extremity supported, Feet supported Sitting balance-Leahy Scale: Good Sitting balance - Comments: received sitting EOB   Standing balance support: During functional activity, No upper extremity supported Standing balance-Leahy Scale: Fair Standing balance comment: supervision no device                            Cognition Arousal: Alert Behavior During Therapy: WFL for tasks assessed/performed Overall Cognitive Status: Difficult to assess                                          Exercises General Exercises - Lower Extremity Ankle Circles/Pumps: AROM, Both, 10 reps, Seated Long Arc Quad: AROM, Both, 10 reps, Seated Hip Flexion/Marching: AROM, Both, 10 reps, Seated    General Comments General comments (skin integrity, edema, etc.): VSS      Pertinent Vitals/Pain Pain Assessment Pain Assessment: Faces Faces Pain Scale: Hurts little more Breathing: normal Negative Vocalization: occasional moan/groan, low speech, negative/disapproving quality Facial Expression: facial grimacing Body Language: tense, distressed pacing, fidgeting Consolability: distracted or reassured  by voice/touch PAINAD Score: 5 Pain Location: chest, back but notes pain has improved since admission Pain Descriptors / Indicators: Discomfort Pain Intervention(s): Limited activity within patient's tolerance, Monitored during session, Repositioned    Home Living                          Prior Function            PT Goals (current goals can now be found in the care plan section) Acute Rehab  PT Goals Patient Stated Goal: none stated Progress towards PT goals: Progressing toward goals    Frequency    Min 1X/week      PT Plan      Co-evaluation              AM-PAC PT "6 Clicks" Mobility   Outcome Measure  Help needed turning from your back to your side while in a flat bed without using bedrails?: None Help needed moving from lying on your back to sitting on the side of a flat bed without using bedrails?: None Help needed moving to and from a bed to a chair (including a wheelchair)?: A Little Help needed standing up from a chair using your arms (e.g., wheelchair or bedside chair)?: A Little Help needed to walk in hospital room?: A Little Help needed climbing 3-5 steps with a railing? : A Little 6 Click Score: 20    End of Session Equipment Utilized During Treatment: Gait belt Activity Tolerance: Patient tolerated treatment well Patient left: in chair;with call bell/phone within reach Nurse Communication: Mobility status PT Visit Diagnosis: Muscle weakness (generalized) (M62.81);Other abnormalities of gait and mobility (R26.89)     Time: 1610-9604 PT Time Calculation (min) (ACUTE ONLY): 28 min  Charges:    $Gait Training: 8-22 mins $Therapeutic Exercise: 8-22 mins PT General Charges $$ ACUTE PT VISIT: 1 Visit                     Arthur Ali,PT Acute Rehab Services [phone]    Arthur Ali 11/23/2022, 11:24 AM

## 2022-11-23 NOTE — Congregational Nurse Program (Signed)
  Dept: [phone]   Congregational Nurse Program Note  Date of Encounter: 11/23/2022  Past Medical History: Past Medical History:  Diagnosis Date   Back pain    Hypertension     Encounter Details:  CNP Questionnaire - 11/23/22 1540       Questionnaire   Ask client: Do you give verbal consent for me to treat you today? Yes    Student Assistance N/A    Location Patient Served  [facility]    Visit Setting with Client Organization    Patient Status Refugee    Engineer, building services or [place] Insurance    Insurance/Financial Assistance Referral N/A    Medication Have Medication Insecurities    Medical Provider Yes    Screening Referrals Made N/A    Medical Referrals Made N/A    Medical Appointment Made N/A    Recently w/o PCP, now 1st time PCP visit completed due to CNs referral or appointment made Yes    Food Have Food Insecurities    Transportation Need transportation assistance;Provided transportation assistance    Housing/Utilities Unable to pay for utilities;Referred to housing/utility assistance program    Interpersonal Safety N/A    Interventions Advocate/Support;Navigate Healthcare System;Case Management;Counsel;Educate;Reviewed Medications    Abnormal to Normal Screening Since Last CN Visit N/A    Screenings CN Performed N/A    Sent Client to Lab for: N/A    Did client attend any of the following based off CNs referral or appointments made? N/A    ED Visit Averted N/A    Life-Saving Intervention Made N/A           I have called patient and talked with him over  the phone. He he is still admitted and may be discharged soon. He is apprehensive about his finances and inability to pay rent and other bills.  Patient has medical insurance and  due to enforcement of a policy regarding patient by corporate compliance, patient assistance funds with [facility] are no longer able to provide monetary assistance to any clients with health insurance.  Therefore, patient did not qualify  for the [facility].I have connected patient with the  [facility] where the case managers will assist him with SNAP application.He is also close to turning 63 to qualify for SSI and medicaid and the case managers at [facility] will assist with applications.  Congregational Nurses will assist with transportation to medical appointments upon discharge.   Nicole Cella Juhi Lagrange RN BSN PCCN  [facility] [phone]-cell [phone]-office

## 2022-11-23 NOTE — Assessment & Plan Note (Signed)
Currently has no insurance and has difficulty with transportation. Language barrier is also affecting his care.  - TOC for Medicaid assistance and transportation - FMLA paperwork for work and financial support - Consulted spiritual and palliative to discuss diagnosis and options further, appreciate assistance and recommendations

## 2022-11-23 NOTE — Progress Notes (Signed)
Attempt to use video interpreter but none currently available for preferred language- Kinyarwanda.

## 2022-11-23 NOTE — Consult Note (Signed)
Subjective:    Consult requested by Dr. Burley Saver.  Arthur Ali is a 62 yo male who was admitted last week with shortness of breath, chest pain and anorexia with weight loss and night sweats.  A CT done on 9/12 to r/o PE showed thoracic and cervical adenopathy with widespread bone mets as well.  He subsequently had a CT AP that showed mild right hydronephrosis without a clear level of obstruction ( has no body fat so the visualization of the ureter was poor.  The prostate was quite large and there is a suggestion of infiltration of the bladder base.  A PSA was done and it was 1440.  A prior lumbar spine MRI in 2021 was negative for mets.   He has generalized bone pain that is worse with eating.  He doesn't specify left flank pain.  He reports that his voiding issues have improved.  HE has a normal Cr.   ROS:  Review of Systems  Unable to perform ROS: Language    No Known Allergies  Past Medical History:  Diagnosis Date   Back pain    Hypertension     History reviewed. No pertinent surgical history.  Social History   Socioeconomic History   Marital status: Married    Spouse name: Not on file   Number of children: Not on file   Years of education: Not on file   Highest education level: Not on file  Occupational History   Not on file  Tobacco Use   Smoking status: Former    Types: Cigars, Cigarettes   Smokeless tobacco: Never   Tobacco comments:    Quit in 2022.   Vaping Use   Vaping status: Never Used  Substance and Sexual Activity   Alcohol use: Never   Drug use: Never   Sexual activity: Not Currently  Other Topics Concern   Not on file  Social History Narrative   Single applicant from [place]   No family here or in [place]      Refugee Information   Number of Immediate Family Members: 0   Number of Immediate Family Members in [place]: 0   Country of Birth: [facility]   Country of Origin: [facility]   Location of Refugee Camp: [place]   Duration in [place]: 20 years or greater    Reason for Leaving Home Country: Political opinion   Primary Language: Swahili/Kiswahili, Kinyarwanda/[place]   Able to Read in Primary Language: Yes   Able to Write in Primary Language: Yes   Education: Primary School   Prior Work: No previous jobs   Marital Status: Other (Wife and children decreased)   Sexual Activity: No   Tuberculosis Screening Overseas: Positive   Tuberculosis Screening Health Department: Not Completed   Health Department Labs Completed: Yes   History of Trauma: None   Do You Feel Jumpy or Nervous?: No   Are You Very Watchful or 'Super Alert'?: No   Social Determinants of Health   Financial Resource Strain: Not on file  Food Insecurity: No Food Insecurity (11/19/2022)   Hunger Vital Sign    Worried About Running Out of Food in the Last Year: Never true    Ran Out of Food in the Last Year: Never true  Transportation Needs: No Transportation Needs (11/19/2022)   PRAPARE - Administrator, Civil Service (Medical): No    Lack of Transportation (Non-Medical): No  Physical Activity: Not on file  Stress: Not on file  Social Connections: Not on  file  Intimate Partner Violence: Not At Risk (11/19/2022)   Humiliation, Afraid, Rape, and Kick questionnaire    Fear of Current or Ex-Partner: No    Emotionally Abused: No    Physically Abused: No    Sexually Abused: No    Family History  Family history unknown: Yes    Anti-infectives: Anti-infectives (From admission, onward)    None       Current Facility-Administered Medications  Medication Dose Route Frequency Provider Last Rate Last Admin   acetaminophen (TYLENOL) tablet 1,000 mg  1,000 mg Oral Q8H Erick Alley, DO   1,000 mg at 11/23/22 1457   bicalutamide (CASODEX) tablet 50 mg  50 mg Oral Daily Johney Maine, MD   50 mg at 11/23/22 1047   dexamethasone (DECADRON) tablet 4 mg  4 mg Oral Q12H Johney Maine, MD   4 mg at 11/23/22 1047   feeding supplement (ENSURE ENLIVE / ENSURE PLUS)  liquid 237 mL  237 mL Oral TID BM Billey Co, MD   237 mL at 11/23/22 1458   influenza vac split trivalent PF (FLULAVAL) injection 0.5 mL  0.5 mL Intramuscular Prior to discharge [name], MD       lidocaine (LIDODERM) 5 % 1 patch  1 patch Transdermal Q24H Fortunato Curling, DO   1 patch at 11/22/22 1700   ondansetron (ZOFRAN) injection 4 mg  4 mg Intravenous Q8H PRN Everhart, Kirstie, DO       oxyCODONE (Oxy IR/ROXICODONE) immediate release tablet 10 mg  10 mg Oral Q4H PRN Erick Alley, DO   10 mg at 11/23/22 1457   polyethylene glycol (MIRALAX / GLYCOLAX) packet 17 g  17 g Oral BID Alfredo Martinez, MD   17 g at 11/23/22 1047   senna (SENOKOT) tablet 8.6 mg  1 tablet Oral Daily Alfredo Martinez, MD   8.6 mg at 11/23/22 1047   Vitamin D (Ergocalciferol) (DRISDOL) 1.25 MG (50000 UNIT) capsule 50,000 Units  50,000 Units Oral Q7 days Johney Maine, MD   50,000 Units at 11/20/22 2104     Objective: Vital signs in last 24 hours: BP 126/81 (BP Location: Left Arm)   Pulse 89   Temp 97.7 F (36.5 C) (Oral)   Resp 12   Ht 5\' 9"  (1.753 m)   SpO2 95%   BMI 16.66 kg/m   Intake/Output from previous day: No intake/output data recorded. Intake/Output this shift: Total I/O In: 600 [P.O.:600] Out: -    Physical Exam Vitals reviewed.  Constitutional:      Appearance: He is ill-appearing.     Comments: cachectic  Cardiovascular:     Rate and Rhythm: Normal rate and regular rhythm.  Pulmonary:     Effort: Pulmonary effort is normal. No respiratory distress.  Abdominal:     General: Abdomen is flat.     Palpations: Abdomen is soft.     Tenderness: There is abdominal tenderness.  Musculoskeletal:        General: Normal range of motion.  Skin:    General: Skin is warm and dry.  Neurological:     General: No focal deficit present.     Mental Status: He is alert and oriented to person, place, and time.  Psychiatric:        Mood and Affect: Mood normal.        Behavior:  Behavior normal.     Lab Results:  Results for orders placed or performed during the hospital encounter of 11/19/22 (from  the past 24 hour(s))  CBC     Status: Abnormal   Collection Time: 11/23/22  4:31 AM  Result Value Ref Range   WBC 10.7 (H) 4.0 - 10.5 K/uL   RBC 4.16 (L) 4.22 - 5.81 MIL/uL   Hemoglobin 11.2 (L) 13.0 - 17.0 g/dL   HCT 19.1 (L) 47.8 - 29.5 %   MCV 83.9 80.0 - 100.0 fL   MCH 26.9 26.0 - 34.0 pg   MCHC 32.1 30.0 - 36.0 g/dL   RDW 62.1 30.8 - 65.7 %   Platelets 242 150 - 400 K/uL   nRBC 0.3 (H) 0.0 - 0.2 %  Basic metabolic panel     Status: Abnormal   Collection Time: 11/23/22  4:31 AM  Result Value Ref Range   Sodium 133 (L) 135 - 145 mmol/L   Potassium 4.6 3.5 - 5.1 mmol/L   Chloride 96 (L) 98 - 111 mmol/L   CO2 28 22 - 32 mmol/L   Glucose, Bld 117 (H) 70 - 99 mg/dL   BUN 18 8 - 23 mg/dL   Creatinine, Ser 8.46 0.61 - 1.24 mg/dL   Calcium 8.3 (L) 8.9 - 10.3 mg/dL   GFR, Estimated >96 >29 mL/min   Anion gap 9 5 - 15    BMET Recent Labs    11/22/22 0250 11/23/22 0431  NA 131* 133*  K 4.3 4.6  CL 94* 96*  CO2 30 28  GLUCOSE 122* 117*  BUN 22 18  CREATININE 0.87 0.81  CALCIUM 8.1* 8.3*   PT/INR No results for input(s): "LABPROT", "INR" in the last 72 hours. ABG No results for input(s): "PHART", "HCO3" in the last 72 hours.  Invalid input(s): "PCO2", "PO2"  Studies/Results: No results found. ECHOCARDIOGRAM COMPLETE  Result Date: 11/21/2022    ECHOCARDIOGRAM REPORT   Patient Name:   Arthur Ali Date of Exam: 11/21/2022 Medical Rec #:  [id number]           Height:       69.0 in Accession #:    [id number]          Weight:       112.8 lb Date of Birth:  02-Jul-1960            BSA:          1.620 m Patient Age:    62 years            BP:           127/84 mmHg Patient Gender: M                   HR:           82 bpm. Exam Location:  Inpatient Procedure: 2D Echo, Color Doppler and Cardiac Doppler Indications:    Cardiomegaly  History:        Patient  has no prior history of Echocardiogram examinations.                 Cardiomegaly; Prostate Cancer.  Sonographer:    Milbert Coulter Referring Phys: [id number] CARINA M BROWN  Sonographer Comments: Image acquisition challenging due to patient body habitus. IMPRESSIONS  1. Left ventricular ejection fraction, by estimation, is 60 to 65%. The left ventricle has normal function. The left ventricle has no regional wall motion abnormalities. Left ventricular diastolic parameters are consistent with Grade I diastolic dysfunction (impaired relaxation).  2. Right ventricular systolic function is normal. The right ventricular size is normal. Tricuspid regurgitation signal is  inadequate for assessing PA pressure.  3. The mitral valve is normal in structure. No evidence of mitral valve regurgitation. No evidence of mitral stenosis.  4. The aortic valve is tricuspid. There is mild calcification of the aortic valve. There is mild thickening of the aortic valve. Aortic valve regurgitation is not visualized. No aortic stenosis is present.  5. The inferior vena cava is normal in size with greater than 50% respiratory variability, suggesting right atrial pressure of 3 mmHg. FINDINGS  Left Ventricle: Left ventricular ejection fraction, by estimation, is 60 to 65%. The left ventricle has normal function. The left ventricle has no regional wall motion abnormalities. The left ventricular internal cavity size was normal in size. There is  no left ventricular hypertrophy. Left ventricular diastolic parameters are consistent with Grade I diastolic dysfunction (impaired relaxation). Normal left ventricular filling pressure. Right Ventricle: The right ventricular size is normal. Right vetricular wall thickness was not well visualized. Right ventricular systolic function is normal. Tricuspid regurgitation signal is inadequate for assessing PA pressure. Left Atrium: Left atrial size was normal in size. Right Atrium: Right atrial size was normal in  size. Pericardium: There is no evidence of pericardial effusion. Mitral Valve: The mitral valve is normal in structure. No evidence of mitral valve regurgitation. No evidence of mitral valve stenosis. Tricuspid Valve: The tricuspid valve is normal in structure. Tricuspid valve regurgitation is not demonstrated. No evidence of tricuspid stenosis. Aortic Valve: The aortic valve is tricuspid. There is mild calcification of the aortic valve. There is mild thickening of the aortic valve. There is mild aortic valve annular calcification. Aortic valve regurgitation is not visualized. No aortic stenosis  is present. Aortic valve mean gradient measures 3.0 mmHg. Aortic valve peak gradient measures 4.6 mmHg. Aortic valve area, by VTI measures 3.56 cm. Pulmonic Valve: The pulmonic valve was not well visualized. Pulmonic valve regurgitation is not visualized. No evidence of pulmonic stenosis. Aorta: The aortic root is normal in size and structure. Venous: The inferior vena cava is normal in size with greater than 50% respiratory variability, suggesting right atrial pressure of 3 mmHg. IAS/Shunts: No atrial level shunt detected by color flow Doppler.  LEFT VENTRICLE PLAX 2D LVIDd:         4.10 cm   Diastology LVIDs:         2.60 cm   LV e' medial:    10.10 cm/s LV PW:         1.00 cm   LV E/e' medial:  5.4 LV IVS:        1.00 cm   LV e' lateral:   13.10 cm/s LVOT diam:     2.30 cm   LV E/e' lateral: 4.2 LV SV:         70 LV SV Index:   43 LVOT Area:     4.15 cm  RIGHT VENTRICLE RV Basal diam:  3.30 cm RV Mid diam:    2.10 cm RV S prime:     19.10 cm/s TAPSE (M-mode): 2.4 cm LEFT ATRIUM           Index        RIGHT ATRIUM           Index LA diam:      2.80 cm 1.73 cm/m   RA Area:     13.10 cm LA Vol (A2C): 41.7 ml 25.75 ml/m  RA Volume:   34.70 ml  21.42 ml/m LA Vol (A4C): 19.5 ml 12.04 ml/m  AORTIC VALVE  AV Area (Vmax):    3.57 cm AV Area (Vmean):   3.40 cm AV Area (VTI):     3.56 cm AV Vmax:           107.00 cm/s AV  Vmean:          73.300 cm/s AV VTI:            0.197 m AV Peak Grad:      4.6 mmHg AV Mean Grad:      3.0 mmHg LVOT Vmax:         91.90 cm/s LVOT Vmean:        59.900 cm/s LVOT VTI:          0.169 m LVOT/AV VTI ratio: 0.86  AORTA Ao Root diam: 3.10 cm MITRAL VALVE MV Area (PHT): 3.60 cm    SHUNTS MV Decel Time: 211 msec    Systemic VTI:  0.17 m MV E velocity: 54.90 cm/s  Systemic Diam: 2.30 cm MV A velocity: 87.30 cm/s MV E/A ratio:  0.63 Dina Rich MD Electronically signed by Dina Rich MD Signature Date/Time: 11/21/2022/4:10:11 PM    Final    CT ABDOMEN PELVIS W CONTRAST  Result Date: 11/21/2022 CLINICAL DATA:  Prostate cancer, high risk, staging EXAM: CT ABDOMEN AND PELVIS WITH CONTRAST TECHNIQUE: Multidetector CT imaging of the abdomen and pelvis was performed using the standard protocol following bolus administration of intravenous contrast. RADIATION DOSE REDUCTION: This exam was performed according to the departmental dose-optimization program which includes automated exposure control, adjustment of the mA and/or kV according to patient size and/or use of iterative reconstruction technique. CONTRAST:  75mL OMNIPAQUE IOHEXOL 350 MG/ML SOLN COMPARISON:  11/19/2022 FINDINGS: Lower chest: Scattered bibasilar hypoventilatory changes. Hepatobiliary: No focal liver abnormality is seen. No gallstones, gallbladder wall thickening, or biliary dilatation. Pancreas: Unremarkable. No pancreatic ductal dilatation or surrounding inflammatory changes. Spleen: Normal in size without focal abnormality. Adrenals/Urinary Tract: The right kidney is unremarkable. The left kidney demonstrates moderate cortical atrophy, with mild left hydronephrosis and hydroureter. Delayed excretion of contrast from the left kidney on delayed imaging. The adrenals are unremarkable. The bladder is moderately distended, without filling defects. Stomach/Bowel: Evaluation of the bowel is limited due to marked cachexia and the lack of oral  contrast. No evidence of bowel obstruction or ileus. Stomach is moderately distended. Vascular/Lymphatic: Aortic atherosclerosis. Marked cachexia and the lack of intraperitoneal and retroperitoneal fat limits evaluation for lymphadenopathy. Right external iliac chain lymph node image 62/3 measures 10 mm in short axis. Possible left para-aortic adenopathy reference image 39/3 measuring 10 mm in short axis. Reproductive: Prostate is enlarged and heterogeneous, measuring 5.9 x 5.5 x 5.7 cm. Other: Patient is markedly cachectic. No free fluid or free intraperitoneal gas. No abdominal wall hernia. Musculoskeletal: Mottled sclerosis is seen throughout all visualized bony structures involving the thoracic cage, spine, and pelvis, consistent with diffuse metastatic disease. No evidence of acute fracture. Reconstructed images demonstrate no additional findings. IMPRESSION: 1. Diffuse mottled sclerosis throughout the visualized bony structures, compatible with diffuse bony metastatic disease in this patient with a history of prostate cancer. 2. Left renal cortical atrophy, with mild left-sided obstructive uropathy. The exact source of obstruction is not well visualized, but could be due to an enlarged prostate causing mass effect at the left UVJ. 3. Borderline enlarged retroperitoneal and pelvic adenopathy as above, metastatic disease not excluded. 4. Heterogeneous enlarged prostate. 5.  Aortic Atherosclerosis (ICD10-I70.0). 6. Marked cachexia. Electronically Signed   By: Maxwell Caul.D.  On: 11/21/2022 14:23   DG Lumbar Spine 2-3 Views  Result Date: 11/19/2022 CLINICAL DATA:  Pain EXAM: LUMBAR SPINE - 2-3 VIEW COMPARISON:  None Available. FINDINGS: There is no evidence of lumbar spine fracture. Alignment is normal. Intervertebral disc spaces are maintained. Mild degenerative endplate osteophytes are seen throughout the lumbar spine. There is diffuse mottled appearance of femoral heads and pelvis. IMPRESSION: 1. Mild  degenerative changes of the lumbar spine. 2. Diffuse mottled appearance of the femoral heads and pelvis, which may be due to osteopenia, metastatic disease, or metabolic bone disease. Electronically Signed   By: Darliss Cheney M.D.   On: 11/19/2022 23:01   CT Angio Chest Pulmonary Embolism (PE) W or WO Contrast  Result Date: 11/19/2022 CLINICAL DATA:  High probability pulmonary embolism EXAM: CT ANGIOGRAPHY CHEST WITH CONTRAST TECHNIQUE: Multidetector CT imaging of the chest was performed using the standard protocol during bolus administration of intravenous contrast. Multiplanar CT image reconstructions and MIPs were obtained to evaluate the vascular anatomy. RADIATION DOSE REDUCTION: This exam was performed according to the departmental dose-optimization program which includes automated exposure control, adjustment of the mA and/or kV according to patient size and/or use of iterative reconstruction technique. CONTRAST:  60mL OMNIPAQUE IOHEXOL 350 MG/ML SOLN COMPARISON:  None Available. FINDINGS: Cardiovascular: There is adequate opacification of the pulmonary arterial tree. No intraluminal filling defect identified to suggest acute pulmonary embolism. Mild central pulmonary arterial enlargement in keeping with changes of pulmonary arterial hypertension. Global cardiac size is within normal limits. There is mild relative enlargement of the right ventricle and shift of the intraventricular septum to the left suggesting elevated right heart pressure. No pericardial effusion. Minimal atherosclerotic calcification within the thoracic aorta. No aortic aneurysm. Mediastinum/Nodes: There is pathologic thoracic adenopathy within the right paratracheal and subcarinal lymph node groups with the index lymph node measuring 4.6 x 2.5 cm at axial image # 42/7 within the right paratracheal lymph node group. Visualized thyroid is unremarkable. The esophagus is unremarkable. Lungs/Pleura: Mild emphysema. 6 mm noncalcified  subpleural pulmonary nodule noted within the right upper lobe, axial image # 27/9. 6 mm noncalcified pulmonary nodule noted within the right apex at axial image # 21/9. No confluent pulmonary infiltrates. No pneumothorax or pleural effusion. Upper Abdomen: There is suspected retrocrural and perirenal adenopathy within the upper abdomen, not well delineated given the lack of intra-abdominal fat and phase of contrast enhancement. No acute adenopathy. Musculoskeletal: Widespread sclerotic metastatic disease throughout the visualized axial skeleton no associated pathologic fracture identified. Marked body wall wasting with near complete lack of a mediastinal and intra-abdominal fat within the visualized upper abdomen. Review of the MIP images confirms the above findings. IMPRESSION: 1. No pulmonary embolism. 2. Pathologic thoracic adenopathy within the right paratracheal and subcarinal lymph node groups. Suspected retrocrural and perirenal adenopathy within the upper abdomen, not well delineated given the lack of intra-abdominal fat and phase of contrast enhancement. Widespread sclerotic metastatic disease throughout the visualized axial skeleton. Findings are in keeping with metastatic disease and correlation with serum PSA level may be helpful for further management. Dedicated nonemergent standard CT examination of the abdomen pelvis or PET CT examination may also be helpful for further evaluation. 3. 6 mm noncalcified pulmonary nodules within the right upper lobe. Follow-up imaging will be dependent upon the patient's underlying malignancy and treatment plan. 4. Mild central pulmonary arterial enlargement in keeping with changes of pulmonary arterial hypertension. Mild relative enlargement of the right ventricle and shift of the intraventricular septum to the left  suggesting elevated right heart pressure. Aortic Atherosclerosis (ICD10-I70.0) and Emphysema (ICD10-J43.9). Electronically Signed   By: Helyn Numbers M.D.    On: 11/19/2022 23:00   DG Chest 2 View  Result Date: 11/05/2022 CLINICAL DATA:  Shortness of breath. EXAM: CHEST - 2 VIEW COMPARISON:  02/12/2020 FINDINGS: Lungs are hyperexpanded. The lungs are clear without focal pneumonia, edema, pneumothorax or pleural effusion. The cardiopericardial silhouette is within normal limits for size. No acute bony abnormality. IMPRESSION: Hyperexpansion without acute cardiopulmonary findings. Electronically Signed   By: Kennith Center M.D.   On: 11/05/2022 17:42     Assessment/Plan: Widely metastatic prostate cancer with mild left hydronephrosis but normal renal function.        He has been started on ADT with Casodex and Lupron.  With mild degree of dilation and the normal renal function, I think the hydronephrosis can be monitored as it might resolve with ADT alone.   As he has a history of non-compliance, I would be reluctant to place a stent if at all possible.       If there is a concern for non-compliance with follow up.  Bilateral orchiectomy could be considered as an option for his primary ADT.  I mentioned that through the video interpreter but he didn't seem interested in that option.          No follow-ups on file.    CC: Dr. Burley Saver.      Arthur Ali 11/23/2022

## 2022-11-23 NOTE — Assessment & Plan Note (Addendum)
Oxycodone 10 mg every 4 as needed with Tylenol - Bowel regimen: Miralax BID and Senna daily

## 2022-11-23 NOTE — Progress Notes (Signed)
When RN entered room to give patient evening medication, patient appeared uncomfortable. RN used video interpreter Sharlet Salina) to communicate with patient. Patient stated he had just eaten about 40 minutes ago and that he now felt like he couldn't breathe. Patient also stated that he felt bloated. Patient was also belching. Patient stated last BM was two days ago. RN obtained set of vital signs and notified family medicine resident.

## 2022-11-23 NOTE — Assessment & Plan Note (Addendum)
Resolved after Lokelma, continue to monitor with BMP.

## 2022-11-23 NOTE — Progress Notes (Signed)
62 y.o. [place] male inpatient. History of back pain and HTN. Admitted  with chest pain, SHOB, unintentional weight loss. Found to have widespread sclerotic metastatic disease. Team is requesting biopsy for tissue diagnosis. Case reviewed by IR Attending Dr. Donne Hazel. After review of the imaging recommends bone lesion biopsy. This was communicated to the Team who is in agreement with the plan of care.    IR consulted for possible bone lesion biopsy. Case has been reviewed and procedure approved by Dr. Shela Commons. [place].  Patient tentatively scheduled for 9.17.24.  Team instructed to: Keep Patient to be NPO after midnight Hold prophylactic anticoagulation 24 hours prior to scheduled procedure.   IR will call patient when ready.

## 2022-11-24 ENCOUNTER — Inpatient Hospital Stay (HOSPITAL_COMMUNITY): Payer: BC Managed Care – PPO

## 2022-11-24 ENCOUNTER — Encounter (HOSPITAL_COMMUNITY): Payer: Self-pay | Admitting: Family Medicine

## 2022-11-24 DIAGNOSIS — C61 Malignant neoplasm of prostate: Secondary | ICD-10-CM | POA: Diagnosis not present

## 2022-11-24 LAB — BASIC METABOLIC PANEL
Anion gap: 7 (ref 5–15)
BUN: 18 mg/dL (ref 8–23)
CO2: 26 mmol/L (ref 22–32)
Calcium: 8.2 mg/dL — ABNORMAL LOW (ref 8.9–10.3)
Chloride: 98 mmol/L (ref 98–111)
Creatinine, Ser: 0.75 mg/dL (ref 0.61–1.24)
GFR, Estimated: >60 mL/min (ref >60)
Glucose, Bld: 96 mg/dL (ref 70–99)
Potassium: 4.9 mmol/L (ref 3.5–5.1)
Sodium: 131 mmol/L — ABNORMAL LOW (ref 135–145)

## 2022-11-24 LAB — CBC
HCT: 35.9 % — ABNORMAL LOW (ref 39.0–52.0)
Hemoglobin: 11.3 g/dL — ABNORMAL LOW (ref 13.0–17.0)
MCH: 27 pg (ref 26.0–34.0)
MCHC: 31.5 g/dL (ref 30.0–36.0)
MCV: 85.7 fL (ref 80.0–100.0)
Platelets: 250 10*3/uL (ref 150–400)
RBC: 4.19 MIL/uL — ABNORMAL LOW (ref 4.22–5.81)
RDW: 13.7 % (ref 11.5–15.5)
WBC: 13.7 10*3/uL — ABNORMAL HIGH (ref 4.0–10.5)
nRBC: 0.4 % — ABNORMAL HIGH (ref 0.0–0.2)

## 2022-11-24 LAB — MULTIPLE MYELOMA PANEL, SERUM
Albumin SerPl Elph-Mcnc: 2.5 g/dL — ABNORMAL LOW (ref 2.9–4.4)
Albumin/Glob SerPl: 0.6 — ABNORMAL LOW (ref 0.7–1.7)
Alpha 1: 0.4 g/dL (ref 0.0–0.4)
Alpha2 Glob SerPl Elph-Mcnc: 1 g/dL (ref 0.4–1.0)
B-Globulin SerPl Elph-Mcnc: 1.1 g/dL (ref 0.7–1.3)
Gamma Glob SerPl Elph-Mcnc: 2.4 g/dL — ABNORMAL HIGH (ref 0.4–1.8)
Globulin, Total: 4.8 g/dL — ABNORMAL HIGH (ref 2.2–3.9)
IgA: 239 mg/dL (ref 61–437)
IgG (Immunoglobin G), Serum: 2542 mg/dL — ABNORMAL HIGH (ref 603–1613)
IgM (Immunoglobulin M), Srm: 698 mg/dL — ABNORMAL HIGH (ref 20–172)
Total Protein ELP: 7.3 g/dL (ref 6.0–8.5)

## 2022-11-24 LAB — QUANTIFERON-TB GOLD PLUS (RQFGPL)
QuantiFERON Mitogen Value: 4.82 [IU]/mL
QuantiFERON Nil Value: 0.09 [IU]/mL
QuantiFERON TB1 Ag Value: 0.09 [IU]/mL
QuantiFERON TB2 Ag Value: 0.11 [IU]/mL

## 2022-11-24 LAB — PROTIME-INR
INR: 1.2 (ref 0.8–1.2)
Prothrombin Time: 15.3 s — ABNORMAL HIGH (ref 11.4–15.2)

## 2022-11-24 LAB — QUANTIFERON-TB GOLD PLUS: QuantiFERON-TB Gold Plus: NEGATIVE

## 2022-11-24 LAB — APTT: aPTT: 36 s (ref 24–36)

## 2022-11-24 MED ORDER — HEPARIN (PORCINE) 25000 UT/250ML-% IV SOLN
850.0000 [IU]/h | INTRAVENOUS | Status: DC
  Administered 2022-11-24: 850 [IU]/h via INTRAVENOUS
  Filled 2022-11-24: qty 250

## 2022-11-24 MED ORDER — DEXAMETHASONE 4 MG PO TABS: 4.0000 mg | ORAL_TABLET | Freq: Every day | ORAL | Status: DC

## 2022-11-24 MED ORDER — HEPARIN BOLUS VIA INFUSION
3500.0000 [IU] | Freq: Once | INTRAVENOUS | Status: AC
  Administered 2022-11-24: 3500 [IU] via INTRAVENOUS
  Filled 2022-11-24: qty 3500

## 2022-11-24 MED ORDER — DEXAMETHASONE 2 MG PO TABS: 2.0000 mg | ORAL_TABLET | Freq: Every day | ORAL | Status: DC

## 2022-11-24 MED ORDER — DEXAMETHASONE 4 MG PO TABS
4.0000 mg | ORAL_TABLET | Freq: Two times a day (BID) | ORAL | Status: DC
  Administered 2022-11-24 – 2022-11-26 (×4): 4 mg via ORAL
  Filled 2022-11-24 (×4): qty 1

## 2022-11-24 MED ORDER — IOHEXOL 350 MG/ML SOLN
75.0000 mL | Freq: Once | INTRAVENOUS | Status: AC | PRN
  Administered 2022-11-24: 75 mL via INTRAVENOUS

## 2022-11-24 NOTE — Consult Note (Signed)
Chief Complaint: Patient was seen in consultation today for Bone Lesion Biopsy at the request of  Dr Estrellita Ludwig   Supervising Physician: Roanna Banning  Patient Status: [facility] - In-pt  History of Present Illness:  Full Code per chart Hx Prostate Cancer -Presented 9/12 with Back symptoms; wt loss; SOB 9/12 CTA confirmed widespread sclerotic metastatic disease throughout axial skeleton -PSA >1400 -Oncology consulted -Please consult IR regarding most appropriate site of biopsy for tissue diagnosis based on imaging. .  -Scheduled 9/17 in IR for Bone Lesion Biopsy Approved with Dr. Donne Hazel   Past Medical History:  Diagnosis Date   Back pain    Hypertension     History reviewed. No pertinent surgical history.  Allergies: Patient has no known allergies.  Medications: Prior to Admission medications   Medication Sig Start Date End Date Taking? Authorizing Provider  azithromycin (ZITHROMAX) 250 MG tablet Take (2) tablets by mouth on day 1, then take (1) tablet by mouth on days 2-5. Patient not taking: Reported on 11/19/2022 11/02/22   Valentino Nose, NP     Family History  Family history unknown: Yes    Social History   Socioeconomic History   Marital status: Married    Spouse name: Not on file   Number of children: Not on file   Years of education: Not on file   Highest education level: Not on file  Occupational History   Not on file  Tobacco Use   Smoking status: Former    Types: Cigars, Cigarettes   Smokeless tobacco: Never   Tobacco comments:    Quit in 2022.   Vaping Use   Vaping status: Never Used  Substance and Sexual Activity   Alcohol use: Never   Drug use: Never   Sexual activity: Not Currently  Other Topics Concern   Not on file  Social History Narrative   Single applicant from [place]   No family here or in [place]      Refugee Information   Number of Immediate Family Members: 0   Number of Immediate Family Members in [place]: 0   Country of  Birth: [facility]   Country of Origin: [facility]   Location of Refugee Camp: [place]   Duration in [place]: 20 years or greater   Reason for Leaving Home Country: Political opinion   Primary Language: Swahili/Kiswahili, Kinyarwanda/[place]   Able to Read in Primary Language: Yes   Able to Write in Primary Language: Yes   Education: Primary School   Prior Work: No previous jobs   Marital Status: Other (Wife and children decreased)   Sexual Activity: No   Tuberculosis Screening Overseas: Positive   Tuberculosis Screening Health Department: Not Completed   Health Department Labs Completed: Yes   History of Trauma: None   Do You Feel Jumpy or Nervous?: No   Are You Very Watchful or 'Super Alert'?: No   Social Determinants of Health   Financial Resource Strain: Not on file  Food Insecurity: No Food Insecurity (11/19/2022)   Hunger Vital Sign    Worried About Running Out of Food in the Last Year: Never true    Ran Out of Food in the Last Year: Never true  Transportation Needs: No Transportation Needs (11/19/2022)   PRAPARE - Administrator, Civil Service (Medical): No    Lack of Transportation (Non-Medical): No  Physical Activity: Not on file  Stress: Not on file  Social Connections: Not on file    Review of Systems:  A 12 point ROS discussed and pertinent positives are indicated in the HPI above.  All other systems are negative.  Vital Signs: BP 120/84 (BP Location: Right Arm)   Pulse 81   Temp 98.5 F (36.9 C) (Oral)   Resp 18   Ht 5\' 9"  (1.753 m)   SpO2 94%   BMI 16.66 kg/m   Physical Exam Cardiovascular:     Rate and Rhythm: Normal rate and regular rhythm.     Heart sounds: Normal heart sounds.  Pulmonary:     Effort: No respiratory distress.     Breath sounds: No wheezing.  Abdominal:     Palpations: Abdomen is soft.  Skin:    General: Skin is warm and dry.  Neurological:     Mental Status: He is alert. Mental status is at baseline.  Psychiatric:     Comments:  Consenting with family upon availability     Imaging: ECHOCARDIOGRAM COMPLETE  Result Date: 11/21/2022    ECHOCARDIOGRAM REPORT   Patient Name:   Arthur Ali Date of Exam: 11/21/2022 Medical Rec #:  [id number]           Height:       69.0 in Accession #:    [id number]          Weight:       112.8 lb Date of Birth:  03/06/1961            BSA:          1.620 m Patient Age:    62 years            BP:           127/84 mmHg Patient Gender: M                   HR:           82 bpm. Exam Location:  Inpatient Procedure: 2D Echo, Color Doppler and Cardiac Doppler Indications:    Cardiomegaly  History:        Patient has no prior history of Echocardiogram examinations.                 Cardiomegaly; Prostate Cancer.  Sonographer:    Milbert Coulter Referring Phys: [id number] CARINA M BROWN  Sonographer Comments: Image acquisition challenging due to patient body habitus. IMPRESSIONS  1. Left ventricular ejection fraction, by estimation, is 60 to 65%. The left ventricle has normal function. The left ventricle has no regional wall motion abnormalities. Left ventricular diastolic parameters are consistent with Grade I diastolic dysfunction (impaired relaxation).  2. Right ventricular systolic function is normal. The right ventricular size is normal. Tricuspid regurgitation signal is inadequate for assessing PA pressure.  3. The mitral valve is normal in structure. No evidence of mitral valve regurgitation. No evidence of mitral stenosis.  4. The aortic valve is tricuspid. There is mild calcification of the aortic valve. There is mild thickening of the aortic valve. Aortic valve regurgitation is not visualized. No aortic stenosis is present.  5. The inferior vena cava is normal in size with greater than 50% respiratory variability, suggesting right atrial pressure of 3 mmHg. FINDINGS  Left Ventricle: Left ventricular ejection fraction, by estimation, is 60 to 65%. The left ventricle has normal function. The left ventricle has  no regional wall motion abnormalities. The left ventricular internal cavity size was normal in size. There is  no left ventricular hypertrophy. Left ventricular diastolic parameters are consistent with Grade I diastolic dysfunction (  impaired relaxation). Normal left ventricular filling pressure. Right Ventricle: The right ventricular size is normal. Right vetricular wall thickness was not well visualized. Right ventricular systolic function is normal. Tricuspid regurgitation signal is inadequate for assessing PA pressure. Left Atrium: Left atrial size was normal in size. Right Atrium: Right atrial size was normal in size. Pericardium: There is no evidence of pericardial effusion. Mitral Valve: The mitral valve is normal in structure. No evidence of mitral valve regurgitation. No evidence of mitral valve stenosis. Tricuspid Valve: The tricuspid valve is normal in structure. Tricuspid valve regurgitation is not demonstrated. No evidence of tricuspid stenosis. Aortic Valve: The aortic valve is tricuspid. There is mild calcification of the aortic valve. There is mild thickening of the aortic valve. There is mild aortic valve annular calcification. Aortic valve regurgitation is not visualized. No aortic stenosis  is present. Aortic valve mean gradient measures 3.0 mmHg. Aortic valve peak gradient measures 4.6 mmHg. Aortic valve area, by VTI measures 3.56 cm. Pulmonic Valve: The pulmonic valve was not well visualized. Pulmonic valve regurgitation is not visualized. No evidence of pulmonic stenosis. Aorta: The aortic root is normal in size and structure. Venous: The inferior vena cava is normal in size with greater than 50% respiratory variability, suggesting right atrial pressure of 3 mmHg. IAS/Shunts: No atrial level shunt detected by color flow Doppler.  LEFT VENTRICLE PLAX 2D LVIDd:         4.10 cm   Diastology LVIDs:         2.60 cm   LV e' medial:    10.10 cm/s LV PW:         1.00 cm   LV E/e' medial:  5.4 LV IVS:         1.00 cm   LV e' lateral:   13.10 cm/s LVOT diam:     2.30 cm   LV E/e' lateral: 4.2 LV SV:         70 LV SV Index:   43 LVOT Area:     4.15 cm  RIGHT VENTRICLE RV Basal diam:  3.30 cm RV Mid diam:    2.10 cm RV S prime:     19.10 cm/s TAPSE (M-mode): 2.4 cm LEFT ATRIUM           Index        RIGHT ATRIUM           Index LA diam:      2.80 cm 1.73 cm/m   RA Area:     13.10 cm LA Vol (A2C): 41.7 ml 25.75 ml/m  RA Volume:   34.70 ml  21.42 ml/m LA Vol (A4C): 19.5 ml 12.04 ml/m  AORTIC VALVE AV Area (Vmax):    3.57 cm AV Area (Vmean):   3.40 cm AV Area (VTI):     3.56 cm AV Vmax:           107.00 cm/s AV Vmean:          73.300 cm/s AV VTI:            0.197 m AV Peak Grad:      4.6 mmHg AV Mean Grad:      3.0 mmHg LVOT Vmax:         91.90 cm/s LVOT Vmean:        59.900 cm/s LVOT VTI:          0.169 m LVOT/AV VTI ratio: 0.86  AORTA Ao Root diam: 3.10 cm MITRAL VALVE MV Area (PHT): 3.60  cm    SHUNTS MV Decel Time: 211 msec    Systemic VTI:  0.17 m MV E velocity: 54.90 cm/s  Systemic Diam: 2.30 cm MV A velocity: 87.30 cm/s MV E/A ratio:  0.63 Dina Rich MD Electronically signed by Dina Rich MD Signature Date/Time: 11/21/2022/4:10:11 PM    Final    CT ABDOMEN PELVIS W CONTRAST  Result Date: 11/21/2022 CLINICAL DATA:  Prostate cancer, high risk, staging EXAM: CT ABDOMEN AND PELVIS WITH CONTRAST TECHNIQUE: Multidetector CT imaging of the abdomen and pelvis was performed using the standard protocol following bolus administration of intravenous contrast. RADIATION DOSE REDUCTION: This exam was performed according to the departmental dose-optimization program which includes automated exposure control, adjustment of the mA and/or kV according to patient size and/or use of iterative reconstruction technique. CONTRAST:  75mL OMNIPAQUE IOHEXOL 350 MG/ML SOLN COMPARISON:  11/19/2022 FINDINGS: Lower chest: Scattered bibasilar hypoventilatory changes. Hepatobiliary: No focal liver abnormality is seen. No  gallstones, gallbladder wall thickening, or biliary dilatation. Pancreas: Unremarkable. No pancreatic ductal dilatation or surrounding inflammatory changes. Spleen: Normal in size without focal abnormality. Adrenals/Urinary Tract: The right kidney is unremarkable. The left kidney demonstrates moderate cortical atrophy, with mild left hydronephrosis and hydroureter. Delayed excretion of contrast from the left kidney on delayed imaging. The adrenals are unremarkable. The bladder is moderately distended, without filling defects. Stomach/Bowel: Evaluation of the bowel is limited due to marked cachexia and the lack of oral contrast. No evidence of bowel obstruction or ileus. Stomach is moderately distended. Vascular/Lymphatic: Aortic atherosclerosis. Marked cachexia and the lack of intraperitoneal and retroperitoneal fat limits evaluation for lymphadenopathy. Right external iliac chain lymph node image 62/3 measures 10 mm in short axis. Possible left para-aortic adenopathy reference image 39/3 measuring 10 mm in short axis. Reproductive: Prostate is enlarged and heterogeneous, measuring 5.9 x 5.5 x 5.7 cm. Other: Patient is markedly cachectic. No free fluid or free intraperitoneal gas. No abdominal wall hernia. Musculoskeletal: Mottled sclerosis is seen throughout all visualized bony structures involving the thoracic cage, spine, and pelvis, consistent with diffuse metastatic disease. No evidence of acute fracture. Reconstructed images demonstrate no additional findings. IMPRESSION: 1. Diffuse mottled sclerosis throughout the visualized bony structures, compatible with diffuse bony metastatic disease in this patient with a history of prostate cancer. 2. Left renal cortical atrophy, with mild left-sided obstructive uropathy. The exact source of obstruction is not well visualized, but could be due to an enlarged prostate causing mass effect at the left UVJ. 3. Borderline enlarged retroperitoneal and pelvic adenopathy as  above, metastatic disease not excluded. 4. Heterogeneous enlarged prostate. 5.  Aortic Atherosclerosis (ICD10-I70.0). 6. Marked cachexia. Electronically Signed   By: Sharlet Salina M.D.   On: 11/21/2022 14:23   DG Lumbar Spine 2-3 Views  Result Date: 11/19/2022 CLINICAL DATA:  Pain EXAM: LUMBAR SPINE - 2-3 VIEW COMPARISON:  None Available. FINDINGS: There is no evidence of lumbar spine fracture. Alignment is normal. Intervertebral disc spaces are maintained. Mild degenerative endplate osteophytes are seen throughout the lumbar spine. There is diffuse mottled appearance of femoral heads and pelvis. IMPRESSION: 1. Mild degenerative changes of the lumbar spine. 2. Diffuse mottled appearance of the femoral heads and pelvis, which may be due to osteopenia, metastatic disease, or metabolic bone disease. Electronically Signed   By: Darliss Cheney M.D.   On: 11/19/2022 23:01   CT Angio Chest Pulmonary Embolism (PE) W or WO Contrast  Result Date: 11/19/2022 CLINICAL DATA:  High probability pulmonary embolism EXAM: CT ANGIOGRAPHY CHEST WITH CONTRAST  TECHNIQUE: Multidetector CT imaging of the chest was performed using the standard protocol during bolus administration of intravenous contrast. Multiplanar CT image reconstructions and MIPs were obtained to evaluate the vascular anatomy. RADIATION DOSE REDUCTION: This exam was performed according to the departmental dose-optimization program which includes automated exposure control, adjustment of the mA and/or kV according to patient size and/or use of iterative reconstruction technique. CONTRAST:  60mL OMNIPAQUE IOHEXOL 350 MG/ML SOLN COMPARISON:  None Available. FINDINGS: Cardiovascular: There is adequate opacification of the pulmonary arterial tree. No intraluminal filling defect identified to suggest acute pulmonary embolism. Mild central pulmonary arterial enlargement in keeping with changes of pulmonary arterial hypertension. Global cardiac size is within normal  limits. There is mild relative enlargement of the right ventricle and shift of the intraventricular septum to the left suggesting elevated right heart pressure. No pericardial effusion. Minimal atherosclerotic calcification within the thoracic aorta. No aortic aneurysm. Mediastinum/Nodes: There is pathologic thoracic adenopathy within the right paratracheal and subcarinal lymph node groups with the index lymph node measuring 4.6 x 2.5 cm at axial image # 42/7 within the right paratracheal lymph node group. Visualized thyroid is unremarkable. The esophagus is unremarkable. Lungs/Pleura: Mild emphysema. 6 mm noncalcified subpleural pulmonary nodule noted within the right upper lobe, axial image # 27/9. 6 mm noncalcified pulmonary nodule noted within the right apex at axial image # 21/9. No confluent pulmonary infiltrates. No pneumothorax or pleural effusion. Upper Abdomen: There is suspected retrocrural and perirenal adenopathy within the upper abdomen, not well delineated given the lack of intra-abdominal fat and phase of contrast enhancement. No acute adenopathy. Musculoskeletal: Widespread sclerotic metastatic disease throughout the visualized axial skeleton no associated pathologic fracture identified. Marked body wall wasting with near complete lack of a mediastinal and intra-abdominal fat within the visualized upper abdomen. Review of the MIP images confirms the above findings. IMPRESSION: 1. No pulmonary embolism. 2. Pathologic thoracic adenopathy within the right paratracheal and subcarinal lymph node groups. Suspected retrocrural and perirenal adenopathy within the upper abdomen, not well delineated given the lack of intra-abdominal fat and phase of contrast enhancement. Widespread sclerotic metastatic disease throughout the visualized axial skeleton. Findings are in keeping with metastatic disease and correlation with serum PSA level may be helpful for further management. Dedicated nonemergent standard CT  examination of the abdomen pelvis or PET CT examination may also be helpful for further evaluation. 3. 6 mm noncalcified pulmonary nodules within the right upper lobe. Follow-up imaging will be dependent upon the patient's underlying malignancy and treatment plan. 4. Mild central pulmonary arterial enlargement in keeping with changes of pulmonary arterial hypertension. Mild relative enlargement of the right ventricle and shift of the intraventricular septum to the left suggesting elevated right heart pressure. Aortic Atherosclerosis (ICD10-I70.0) and Emphysema (ICD10-J43.9). Electronically Signed   By: Helyn Numbers M.D.   On: 11/19/2022 23:00   DG Chest 2 View  Result Date: 11/05/2022 CLINICAL DATA:  Shortness of breath. EXAM: CHEST - 2 VIEW COMPARISON:  02/12/2020 FINDINGS: Lungs are hyperexpanded. The lungs are clear without focal pneumonia, edema, pneumothorax or pleural effusion. The cardiopericardial silhouette is within normal limits for size. No acute bony abnormality. IMPRESSION: Hyperexpansion without acute cardiopulmonary findings. Electronically Signed   By: Kennith Center M.D.   On: 11/05/2022 17:42    Labs:  CBC: Recent Labs    11/21/22 0411 11/22/22 0250 11/23/22 0431 11/24/22 0555  WBC 10.2 9.2 10.7* 13.7*  HGB 11.8* 10.9* 11.2* 11.3*  HCT 37.3* 33.6* 34.9* 35.9*  PLT 194 215  242 250    COAGS: Recent Labs    11/19/22 2020  INR 1.2  APTT 31    BMP: Recent Labs    11/21/22 1639 11/22/22 0250 11/23/22 0431 11/24/22 0555  NA 132* 131* 133* 131*  K 4.3 4.3 4.6 4.9  CL 95* 94* 96* 98  CO2 29 30 28 26   GLUCOSE 172* 122* 117* 96  BUN 18 22 18 18   CALCIUM 8.0* 8.1* 8.3* 8.2*  CREATININE 0.84 0.87 0.81 0.75  GFRNONAA >60 >60 >60 >60    LIVER FUNCTION TESTS: Recent Labs    11/05/22 1713 11/19/22 2020 11/20/22 0623 11/21/22 0411  BILITOT 0.3 0.4 0.3 0.4  AST 32 26 23 24   ALT 18 21 18 19   ALKPHOS 560* 503* 497* 500*  PROT 10.2* 7.9 7.2 8.1  ALBUMIN 2.9*  2.0* 1.8* 2.0*    TUMOR MARKERS: No results for input(s): "AFPTM", "CEA", "CA199", "CHROMGRNA" in the last 8760 hours.  Assessment and Plan:  9/17 Bone lesion biopsy in IR Gaining consent from family asap   Thank you for this interesting consult.  I greatly enjoyed meeting Arthur Ali and look forward to participating in their care.  A copy of this report was sent to the requesting provider on this date.  Electronically Signed: Robet Leu, PA-C9/17/2024, 8:13 AM   I spent a total of  15 Minutes   in face to face in clinical consultation, greater than 50% of which was counseling/coordinating care for bone lesion biopsy

## 2022-11-24 NOTE — Progress Notes (Signed)
Daily Progress Note Intern Pager: [phone]  Patient name: Arthur Ali Medical record number: [id number] Date of birth: April 26, 1960 Age: 62 y.o. Gender: male  Primary Care Provider: [name], MD Consultants: Oncology, IR Code Status: Full Code  Pt Overview and Major Events to Date:  9/12: Admitted to FMTS 9/13: Started Casodex (9/13-9/27), Decadron 4 mg (9/13-9/20) and Vit D 50,000u weekly 9/14: On Lupron  9/16: IR consulted for possible biopsy  Assessment and Plan: Arthur Ali is a 62 year old male who presented initially with B symptoms and had CTA that confirmed widespread sclerotic metastatic disease throughout the axial skeleton.  He additionally had a PSA of >1400.  Oncology was consulted. Assessment & Plan Prostate cancer metastatic to multiple sites [facility]) Patient has been lost to follow up for some time. Previous multiple myeloma panel in 2021 with elevated IgG and IgM, elevated total protein 9.5>10.2>7.9>7.2>8.1, and elevated alkaline phosphatase 560>503>497>500. CT abdomen and pelvis with contrast showed diffuse mottled sclerosis throughout visualized bony structures with left renal cortical atrophy and mild left sided obstructive uropathy.  - Regular diet and Ensure, RD on board - PT/OT to treat - Urology: Consulted regarding the obstructed uropathy. Recommends continued monitoring with ADT (Casodex and Lupron) and doesn't think a stent would be possible with hx of non-compliance - Labs: Quantiferon-Gold, Multiple myeloma panel pending - TB pending, airborne precautions  - Imaging: CTPE to rule out PE d/t SOB and rib pain/discomfort - Oncology consulted, appreciate recs: - Once CT-guided biopsy of abdomen and pelvis is done we will need CT-guided biopsy of one of the accessible lesions to confirm tissue diagnosis. IR consulted, awaiting consent and biopsy - If biopsy site not available: treat based on clinical picture and elevated PSA levels & do  outpatient Guardant36- for molecular profiling - Vit D level and start on high-dose Vit D 50,000u weekly for very significant hungry bone syndrome with high risk of hypocalcemia when started on bone directed therapies (bisphosphonate or Xgeva); dental eval first to prevent osteonecrosis of the jaw - If patient gets hypercalcemic -- immediately proceed with bisphosphonate/Xgeva without dental evaluation - Casodex 50 mg PO daily x2 weeks (9/14-9/28) and then switch to [facility] upon oncology clinic follow up - Dexamethasone taper: 4 mg PO BID for 1 week (9/13-9/20) ---> 4 mg PO daily for 1 week (9/21-9/28) ---> 2 mg PO daily for 1 week (9/29-10/6) ---> stop Goals of care, counseling/discussion Currently has no insurance and has difficulty with transportation. Language barrier is also affecting his care.  - TOC for Medicaid assistance and transportation - FMLA paperwork for work and financial support - Consulted spiritual and palliative to discuss diagnosis and options further, appreciate assistance and recommendations Hyperkalemia Resolved after Lokelma, continue to monitor with BMP. Cancer related pain Oxycodone 10 mg every 4 as needed with Tylenol - Bowel regimen: Miralax BID and Senna daily   Chronic and Stable Problems: none   FEN/GI: Regular diet with RD on board PPx: Lovenox Dispo: Several days pending oncology recommendations, unsure what can be done outpatient versus inpatient but will defer to oncology expertise.  Subjective:  Patient is doing well this morning and reports that his pain is improving. Again, he is most concerned about getting his bills paid since they are due today. He was unaware about the bone biopsy, but it has been difficult to relay medical information to him with his health literacy.   Objective: Temp:  [97.7 F (36.5 C)-98.5 F (36.9 C)] 98.5 F (36.9 C) (09/17  4098) Pulse Rate:  [81-89] 81 (09/17 0442) Resp:  [12-20] 18 (09/17 0442) BP: (120-161)/(81-97)  120/84 (09/17 0442) SpO2:  [94 %-97 %] 94 % (09/17 0442) Physical Exam: General: Cachetic male awake and alert in NAD HEENT: Normocephalic, atraumatic. Conjunctiva normal. No nasal discharge Cardiovascular: RRR. No M/R/G Respiratory: CTAB, normal WOB on RA. No wheezing, crackles, rhonchi, or diminished breath sounds. Abdomen: Soft, non-tender, non-distended. Bowel sounds normoactive Extremities: No BLE edema, no deformities or significant joint findings. Skin: Warm and dry.  Laboratory: Most recent CBC Lab Results  Component Value Date   WBC 13.7 (H) 11/24/2022   HGB 11.3 (L) 11/24/2022   HCT 35.9 (L) 11/24/2022   MCV 85.7 11/24/2022   PLT 250 11/24/2022   Most recent BMP    Latest Ref Rng & Units 11/24/2022    5:55 AM  BMP  Glucose 70 - 99 mg/dL 96   BUN 8 - 23 mg/dL 18   Creatinine 1.19 - 1.24 mg/dL 1.47   Sodium 829 - 562 mmol/L 131   Potassium 3.5 - 5.1 mmol/L 4.9   Chloride 98 - 111 mmol/L 98   CO2 22 - 32 mmol/L 26   Calcium 8.9 - 10.3 mg/dL 8.2      Imaging/Diagnostic Tests: No new imaging.  Fortunato Curling, DO 11/24/2022, 10:27 AM PGY-1, [facility]  FPTS Intern pager: [phone], text pages welcome Secure chat group [facility] Teaching Service

## 2022-11-24 NOTE — Progress Notes (Addendum)
CSW received consult for patient. CSW called [employer] interpre[employer] and spoke with [place] # [id number]. Tristan Schroeder tried to call into patients room,no answer. CSW will try back again. CSW will continue to follow.  Using [employer] interpreters. Patient gave CSW permission to reach out to financial counseling to screen patient for medicaid. CSW emailed [place] with Financial Counseling and request for patient to be screened for Medicaid.Patient accepted resources for medicaid and transportation.

## 2022-11-24 NOTE — Assessment & Plan Note (Addendum)
Patient has been lost to follow up for some time. Previous multiple myeloma panel in 2021 with elevated IgG and IgM, elevated total protein 9.5>10.2>7.9>7.2>8.1, and elevated alkaline phosphatase 560>503>497>500. CT abdomen and pelvis with contrast showed diffuse mottled sclerosis throughout visualized bony structures with left renal cortical atrophy and mild left sided obstructive uropathy.  - Regular diet and Ensure, RD on board - PT/OT to treat - Urology: Consulted regarding the obstructed uropathy. Recommends continued monitoring with ADT (Casodex and Lupron) and doesn't think a stent would be possible with hx of non-compliance - Labs: Quantiferon-Gold, Multiple myeloma panel pending - TB pending, airborne precautions  - Imaging: CTPE to rule out PE d/t SOB and rib pain/discomfort - Oncology consulted, appreciate recs: - Once CT-guided biopsy of abdomen and pelvis is done we will need CT-guided biopsy of one of the accessible lesions to confirm tissue diagnosis. IR consulted, awaiting consent and biopsy - If biopsy site not available: treat based on clinical picture and elevated PSA levels & do outpatient Guardant36- for molecular profiling - Vit D level and start on high-dose Vit D 50,000u weekly for very significant hungry bone syndrome with high risk of hypocalcemia when started on bone directed therapies (bisphosphonate or Xgeva); dental eval first to prevent osteonecrosis of the jaw - If patient gets hypercalcemic -- immediately proceed with bisphosphonate/Xgeva without dental evaluation - Casodex 50 mg PO daily x2 weeks (9/14-9/28) and then switch to [facility] upon oncology clinic follow up - Dexamethasone taper: 4 mg PO BID for 1 week (9/13-9/20) ---> 4 mg PO daily for 1 week (9/21-9/28) ---> 2 mg PO daily for 1 week (9/29-10/6) ---> stop

## 2022-11-24 NOTE — Progress Notes (Signed)
No on call dentist presently through [facility] this week. Dr. Rocky Morel will start on call dentistry next week. Called Dr. Domenic Polite office and left my personal information with Velna Hatchet, Diplomatic Services operational officer, for Dr. Rocky Morel to call me back regarding this patient and inpatient assessment. Awaiting return call.   Dr. Rocky Morel returned call and noted that she would need Xray in her office in order to do a complete evaluation with dentistry. Unsure if solely visual evaluation would suffice for dental assessment. Will speak to the team.   Alfredo Martinez

## 2022-11-24 NOTE — Assessment & Plan Note (Signed)
Oxycodone 10 mg every 4 as needed with Tylenol - Bowel regimen: Miralax BID and Senna daily

## 2022-11-24 NOTE — Progress Notes (Signed)
   IR procedure planned for 9/18 See orders

## 2022-11-24 NOTE — Progress Notes (Signed)
ANTICOAGULATION CONSULT NOTE - Initial Consult  Pharmacy Consult for Heparin Indication: pulmonary embolus  No Known Allergies  Patient Measurements: Height: 5\' 9"  (175.3 cm) IBW/kg (Calculated) : 70.7 Heparin Dosing Weight: total weight 51.2 kg  Vital Signs:    Labs: Recent Labs    11/22/22 0250 11/23/22 0431 11/24/22 0555  HGB 10.9* 11.2* 11.3*  HCT 33.6* 34.9* 35.9*  PLT 215 242 250  CREATININE 0.87 0.81 0.75    Estimated Creatinine Clearance: 67.6 mL/min (by C-G formula based on SCr of 0.75 mg/dL).   Medical History: Past Medical History:  Diagnosis Date   Back pain    Hypertension     Medications:  Scheduled:   acetaminophen  1,000 mg Oral Q8H   bicalutamide  50 mg Oral Daily   dexamethasone  4 mg Oral Q12H   Followed by   Melene Muller ON 11/28/2022] dexamethasone  4 mg Oral Daily   Followed by   Melene Muller ON 12/05/2022] dexamethasone  2 mg Oral Daily   feeding supplement  237 mL Oral TID BM   lidocaine  1 patch Transdermal Q24H   polyethylene glycol  34 g Oral BID   senna  1 tablet Oral BID   Vitamin D (Ergocalciferol)  50,000 Units Oral Q7 days   Infusions:  PRN: influenza vac split trivalent PF, ondansetron (ZOFRAN) IV, oxyCODONE  Assessment: Pharmacy consulted to dose IV heparin for new PE in this 62 yo male with metastatic prostate cancer. No anticoagulants PTA, last dose of enoxaparin 40mg  on 9/15 at 21:06. Hgb 11.3, Plts wnl.  Goal of Therapy:  Heparin level 0.3-0.7 units/ml Monitor platelets by anticoagulation protocol: Yes   Plan:  Give 3500 units bolus x 1 Start heparin infusion at 850 units/hr Check anti-Xa level in 6 hours and daily while on heparin Continue to monitor H&H and platelets F/u with IR when/if heparin needs to be held prior to bone biopsy 9/18  Loralee Pacas, PharmD, BCPS 11/24/2022,7:28 PM  Please check AMION for all [facility] Pharmacy phone numbers After 10:00 PM, call Main Pharmacy [phone]

## 2022-11-24 NOTE — Plan of Care (Signed)

## 2022-11-24 NOTE — Progress Notes (Signed)
Physical Therapy Treatment Patient Details Name: Arthur Ali MRN: [id number] DOB: [birth year] Today's Date: 11/24/2022   History of Present Illness Pt is a 62 y/o M admitted on 11/19/22 after presenting with c/o worsening dyspnea, chest pain, night sweats, loss of appetite & unintentional weight loss. 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 PMH: back pain, HTN    PT Comments  Pt admitted with above diagnosis. Pt was able to ambulate in room with good safety and no assist. No assistive device as well. Pt progressing well and most likely will progress to not need HHPT.  Pt does state that he feels safe caring for himself at home.   Pt currently with functional limitations due to the deficits listed below (see PT Problem List). Pt will benefit from acute skilled PT to increase their independence and safety with mobility to allow discharge.       If plan is discharge home, recommend the following: Assistance with cooking/housework;Assist for transportation;A little help with walking and/or transfers;A little help with bathing/dressing/bathroom   Can travel by private vehicle        Equipment Recommendations  None recommended by PT    Recommendations for Other Services       Precautions / Restrictions Precautions Precautions: Fall Precaution Comments: airborne/contact Restrictions Weight Bearing Restrictions: No     Mobility  Bed Mobility Overal bed mobility: Modified Independent                  Transfers Overall transfer level: Needs assistance Equipment used: None Transfers: Sit to/from Stand Sit to Stand: Supervision           General transfer comment: STS from EOB without AD    Ambulation/Gait Ambulation/Gait assistance: Supervision Gait Distance (Feet): 70 Feet Assistive device: None   Gait velocity: decreased Gait velocity interpretation: 1.31 - 2.62 ft/sec,  indicative of limited community ambulator   General Gait Details: Pt ambulates  without AD with supervision with slow and steady gait. Pt did not need cues and demonstrates good safety overall. did walk less distance due to c/o incr left side pain with breathing. Notified nursing.   Stairs             Wheelchair Mobility     Tilt Bed    Modified Rankin (Stroke Patients Only)       Balance Overall balance assessment: Needs assistance Sitting-balance support: No upper extremity supported, Feet supported Sitting balance-Leahy Scale: Good     Standing balance support: During functional activity, No upper extremity supported Standing balance-Leahy Scale: Fair Standing balance comment: supervision no device                            Cognition Arousal: Alert Behavior During Therapy: WFL for tasks assessed/performed Overall Cognitive Status: Difficult to assess                                          Exercises General Exercises - Lower Extremity Ankle Circles/Pumps: AROM, Both, 10 reps, Seated Long Arc Quad: AROM, Both, 10 reps, Seated Hip Flexion/Marching: AROM, Both, 10 reps, Seated    General Comments General comments (skin integrity, edema, etc.): BP elevated to 151/96, made nurse aware. Sats 94% on rA      Pertinent Vitals/Pain Pain Assessment Pain Assessment: Faces Faces  Pain Scale: Hurts whole lot Breathing: normal Pain Location: left side and back Pain Descriptors / Indicators: Discomfort, Grimacing, Guarding Pain Intervention(s): Limited activity within patient's tolerance, Monitored during session, Repositioned    Home Living                          Prior Function            PT Goals (current goals can now be found in the care plan section) Acute Rehab PT Goals Patient Stated Goal: none stated Progress towards PT goals: Progressing toward goals    Frequency    Min 1X/week      PT Plan       Co-evaluation              AM-PAC PT "6 Clicks" Mobility   Outcome Measure  Help needed turning from your back to your side while in a flat bed without using bedrails?: None Help needed moving from lying on your back to sitting on the side of a flat bed without using bedrails?: None Help needed moving to and from a bed to a chair (including a wheelchair)?: A Little Help needed standing up from a chair using your arms (e.g., wheelchair or bedside chair)?: A Little Help needed to walk in hospital room?: A Little Help needed climbing 3-5 steps with a railing? : A Little 6 Click Score: 20    End of Session Equipment Utilized During Treatment: Gait belt Activity Tolerance: Patient limited by pain Patient left: with call bell/phone within reach;in bed;with bed alarm set Nurse Communication: Mobility status (Elevated BP and pain left side with breathing) PT Visit Diagnosis: Muscle weakness (generalized) (M62.81);Other abnormalities of gait and mobility (R26.89)     Time: 1020-1043 PT Time Calculation (min) (ACUTE ONLY): 23 min  Charges:    $Gait Training: 23-37 mins PT General Charges $$ ACUTE PT VISIT: 1 Visit                     Shamanda Len M,PT Acute Rehab Services [phone]    Bevelyn Buckles 11/24/2022, 11:31 AM

## 2022-11-24 NOTE — Assessment & Plan Note (Signed)
Currently has no insurance and has difficulty with transportation. Language barrier is also affecting his care.  - TOC for Medicaid assistance and transportation - FMLA paperwork for work and financial support - Consulted spiritual and palliative to discuss diagnosis and options further, appreciate assistance and recommendations

## 2022-11-24 NOTE — Progress Notes (Signed)
   We have now seen pt and have consent. Spoke to him using Interpreter lin for his specific language Interpreter [phone]  MD is working pt up for possible PE She has asked IR to wait to perform Bx after this work up She will let us know when we can move forward---hopefull 9/18 or 9/19     Risks and benefits of bone lesion biopsy was discussed with the patient through phone interprteter [phone] and/or patient's family including, but not limited to bleeding, infection, damage to adjacent structures or low yield requiring additional tests.  All of the questions were answered and there is agreement to proceed.  Consent signed and in chart.

## 2022-11-24 NOTE — Assessment & Plan Note (Signed)
Resolved after Lokelma, continue to monitor with BMP.

## 2022-11-25 ENCOUNTER — Other Ambulatory Visit (HOSPITAL_COMMUNITY): Payer: Self-pay

## 2022-11-25 ENCOUNTER — Inpatient Hospital Stay (HOSPITAL_COMMUNITY): Payer: BC Managed Care – PPO

## 2022-11-25 DIAGNOSIS — C7989 Secondary malignant neoplasm of other specified sites: Secondary | ICD-10-CM

## 2022-11-25 DIAGNOSIS — C61 Malignant neoplasm of prostate: Secondary | ICD-10-CM | POA: Diagnosis not present

## 2022-11-25 LAB — CBC
HCT: 36.3 % — ABNORMAL LOW (ref 39.0–52.0)
Hemoglobin: 11.8 g/dL — ABNORMAL LOW (ref 13.0–17.0)
MCH: 28 pg (ref 26.0–34.0)
MCHC: 32.5 g/dL (ref 30.0–36.0)
MCV: 86 fL (ref 80.0–100.0)
Platelets: 251 10*3/uL (ref 150–400)
RBC: 4.22 MIL/uL (ref 4.22–5.81)
RDW: 13.7 % (ref 11.5–15.5)
WBC: 10.3 10*3/uL (ref 4.0–10.5)
nRBC: 0.5 % — ABNORMAL HIGH (ref 0.0–0.2)

## 2022-11-25 LAB — BASIC METABOLIC PANEL WITH GFR
Anion gap: 6 (ref 5–15)
BUN: 20 mg/dL (ref 8–23)
CO2: 26 mmol/L (ref 22–32)
Calcium: 8.2 mg/dL — ABNORMAL LOW (ref 8.9–10.3)
Chloride: 99 mmol/L (ref 98–111)
Creatinine, Ser: 0.71 mg/dL (ref 0.61–1.24)
GFR, Estimated: >60 mL/min
Glucose, Bld: 103 mg/dL — ABNORMAL HIGH (ref 70–99)
Potassium: 4.5 mmol/L (ref 3.5–5.1)
Sodium: 131 mmol/L — ABNORMAL LOW (ref 135–145)

## 2022-11-25 MED ORDER — APIXABAN 5 MG PO TABS
10.0000 mg | ORAL_TABLET | Freq: Two times a day (BID) | ORAL | Status: DC
  Administered 2022-11-25 – 2022-11-26 (×3): 10 mg via ORAL
  Filled 2022-11-25 (×3): qty 2

## 2022-11-25 MED ORDER — APIXABAN (ELIQUIS) VTE STARTER PACK (10MG AND 5MG)
ORAL_TABLET | ORAL | 0 refills | Status: AC
Start: 2022-11-25 — End: ?
  Filled 2022-11-25: qty 74, 30d supply, fill #0

## 2022-11-25 MED ORDER — ACETAMINOPHEN 500 MG PO TABS
1000.0000 mg | ORAL_TABLET | Freq: Three times a day (TID) | ORAL | 0 refills | Status: DC
  Filled 2022-11-25: qty 100, 17d supply, fill #0

## 2022-11-25 MED ORDER — TECHNETIUM TC 99M MEDRONATE IV KIT
20.0000 | PACK | Freq: Once | INTRAVENOUS | Status: AC | PRN
  Administered 2022-11-25: 20 via INTRAVENOUS

## 2022-11-25 MED ORDER — BICALUTAMIDE 50 MG PO TABS
50.0000 mg | ORAL_TABLET | Freq: Every day | ORAL | 0 refills | Status: AC
Start: 2022-11-26 — End: ?
  Filled 2022-11-25: qty 12, 12d supply, fill #0

## 2022-11-25 MED ORDER — FENTANYL CITRATE (PF) 100 MCG/2ML IJ SOLN
INTRAMUSCULAR | Status: AC
  Filled 2022-11-25: qty 2

## 2022-11-25 MED ORDER — ENSURE ENLIVE PO LIQD: 237.0000 mL | Freq: Three times a day (TID) | ORAL | Status: DC

## 2022-11-25 MED ORDER — OXYCODONE HCL 5 MG PO TABS
5.0000 mg | ORAL_TABLET | ORAL | 0 refills | Status: DC | PRN
  Filled 2022-11-25: qty 21, 4d supply, fill #0

## 2022-11-25 MED ORDER — POLYETHYLENE GLYCOL 3350 17 G PO PACK: 34.0000 g | PACK | Freq: Two times a day (BID) | ORAL | Status: DC

## 2022-11-25 MED ORDER — MIDAZOLAM HCL 2 MG/2ML IJ SOLN
INTRAMUSCULAR | Status: AC
  Filled 2022-11-25: qty 2

## 2022-11-25 MED ORDER — DEXAMETHASONE 2 MG PO TABS
ORAL_TABLET | ORAL | 0 refills | Status: DC
  Filled 2022-11-25: qty 33, 17d supply, fill #0

## 2022-11-25 MED ORDER — VITAMIN D (ERGOCALCIFEROL) 1.25 MG (50000 UNIT) PO CAPS
50000.0000 [IU] | ORAL_CAPSULE | ORAL | 0 refills | Status: DC
  Filled 2022-11-25: qty 10, 70d supply, fill #0

## 2022-11-25 MED ORDER — MIDAZOLAM HCL 2 MG/2ML IJ SOLN
INTRAMUSCULAR | Status: AC | PRN
Start: 2022-11-25 — End: 2022-11-25
  Administered 2022-11-25: .5 mg via INTRAVENOUS
  Administered 2022-11-25: 1 mg via INTRAVENOUS

## 2022-11-25 MED ORDER — FENTANYL CITRATE (PF) 100 MCG/2ML IJ SOLN
INTRAMUSCULAR | Status: AC | PRN
Start: 2022-11-25 — End: 2022-11-25
  Administered 2022-11-25 (×3): 25 ug via INTRAVENOUS

## 2022-11-25 NOTE — Assessment & Plan Note (Signed)
Resolved after Lokelma, continue to monitor with BMP.

## 2022-11-25 NOTE — Plan of Care (Signed)

## 2022-11-25 NOTE — Procedures (Signed)
Interventional Radiology Procedure Note  Procedure: CT guided core biopsy of sclerotic infiltration of the right posterior iliac bone. Anticipate prostate mets  Complications: None  Recommendations: - Bedrest supine x 1 hrs.  Ambulate per primary order - OTC's PRN  Pain - Follow biopsy results - ok to advance diet per primary order  Signed,  Yvone Neu. Loreta Ave, DO

## 2022-11-25 NOTE — Assessment & Plan Note (Addendum)
Patient has been lost to follow up for some time. Previous multiple myeloma panel in 2021 with elevated IgG and IgM, elevated total protein 9.5>10.2>7.9>7.2>8.1, and elevated alkaline phosphatase 560>503>497>500. CT abdomen and pelvis with contrast showed diffuse mottled sclerosis throughout visualized bony structures with left renal cortical atrophy and mild left sided obstructive uropathy.   TB was on the differential at admission due to unintentional weight loss, subjective fevers, and some coughs. However, there is low suspicion for TB at this time due to negative Quantiferon Gold - symptoms present at admission are likely due to diagnosis of metastatic prostate cancer vs possible PE/ linear density noted in the lower lobe branch of R pulmonary artery along with mediastinal adenopathy and retroperitoneal adenopathy consistency with metastatic disease/malignancy on the repeat CTPE.  - Regular diet and Ensure, RD on board - PT/OT to treat - Urology: Consulted regarding the obstructed uropathy. Recommends continued monitoring with ADT (Casodex and Lupron) and doesn't think a stent would be possible with hx of non-compliance - Bone biopsy and bone scan pending - Oncology consulted, appreciate recs: - Once CT-guided biopsy of abdomen and pelvis is done we will need CT-guided biopsy of one of the accessible lesions to confirm tissue diagnosis. IR consulted, awaiting consent and biopsy - If biopsy site not available: treat based on clinical picture and elevated PSA levels & do outpatient Guardant36- for molecular profiling - Vit D level and start on high-dose Vit D 50,000u weekly for very significant hungry bone syndrome with high risk of hypocalcemia when started on bone directed therapies (bisphosphonate or Xgeva); dental eval first to prevent osteonecrosis of the jaw (discussed with outpt dentist, who doesn't think this would be possible without pan scan of mouth) - If patient gets hypercalcemic --  immediately proceed with bisphosphonate/Xgeva without dental evaluation - Casodex 50 mg PO daily x2 weeks (9/14-9/28) and then switch to [facility] upon oncology clinic follow up - Dexamethasone taper: 4 mg PO BID for 1 week (9/13-9/20) ---> 4 mg PO daily for 1 week (9/21-9/28) ---> 2 mg PO daily for 1 week (9/29-10/6) ---> stop

## 2022-11-25 NOTE — TOC Benefit Eligibility Note (Signed)
Patient Product/process development scientist completed.    The patient is insured through [facility]. Patient has [employer], may use a copay card, and/or apply for patient assistance if available.    Ran test claim for Eliquis 5 mg and the current 30 day co-pay is $75.00.   This test claim was processed through [facility]- copay amounts may vary at other pharmacies due to pharmacy/plan contracts, or as the patient moves through the different stages of their insurance plan.     Roland Earl, CPHT Pharmacy Technician III Certified Patient Advocate [facility] Pharmacy Patient Advocate Team Direct Number: [phone]  Fax: [phone]

## 2022-11-25 NOTE — Discharge Instructions (Addendum)
Thank you for allowing [place] to participate in your care!   Arthur Ali stayed in the hospital for workup regarding his unintentional weight loss and dyspnea. He was diagnosed with Metastatic Prostate Cancer and started on treatment per oncology.   Discharge Date: 11/26/22  Instructions for Home: 1) Please follow up with the oncologist Dr. Candise Che on 9/30 and Dr. Manson Passey on 10/7.  2) Please take your new medications as prescribed and explained by the pharmacy team.  New medication during this admission:  - Casodex (9/13-9/27) - Lupon (9/14) - Vitamin D 50,000u weekly - Dexamethasone 4 mg twice daily for one week (9/13-9/20) ---> 4 mg PO daily for 1 week (9/21-9/28) ---> 2 mg PO daily for 1 week (9/29-10/6) - Pain medication: Tylenol 1000 mg every 6 hours as needed for pain and Oxycodone 5 mg every 4 hours as needed for severe pain - Miralax: take 34 g for constipation - Eliquis 10 mg twice daily for one week followed by 5 mg twice daily thereafter for 3 months.  Please be aware that pharmacies may use different concentrations of medications.   Feeding: regular home feeding (diet with lots of water, fruits and vegetables and low in junk food such as pizza and chicken nuggets)  Activity Restrictions: as tolerated per your pain  Person receiving printed copy of discharge instructions: Yes   Information on my medicine - ELIQUIS (apixaban)  This medication education was reviewed with me or my healthcare representative as part of my discharge preparation.  Why was Eliquis prescribed for you? Eliquis was prescribed to treat blood clots that may have been found in the veins of your legs (deep vein thrombosis) or in your lungs (pulmonary embolism) and to reduce the risk of them occurring again.  What do You need to know about Eliquis ? The starting dose is 10 mg (two 5 mg tablets) taken TWICE daily for the FIRST SEVEN (7) DAYS, then on December 02, 2022  the dose is reduced to ONE 5 mg  tablet taken TWICE daily.  Eliquis may be taken with or without food.   Try to take the dose about the same time in the morning and in the evening. If you have difficulty swallowing the tablet whole please discuss with your pharmacist how to take the medication safely.  Take Eliquis exactly as prescribed and DO NOT stop taking Eliquis without talking to the doctor who prescribed the medication.  Stopping may increase your risk of developing a new blood clot.  Refill your prescription before you run out.  After discharge, you should have regular check-up appointments with your healthcare provider that is prescribing your Eliquis.    What do you do if you miss a dose? If a dose of ELIQUIS is not taken at the scheduled time, take it as soon as possible on the same day and twice-daily administration should be resumed. The dose should not be doubled to make up for a missed dose.  Important Safety Information A possible side effect of Eliquis is bleeding. You should call your healthcare provider right away if you experience any of the following: Bleeding from an injury or your nose that does not stop. Unusual colored urine (red or dark brown) or unusual colored stools (red or black). Unusual bruising for unknown reasons. A serious fall or if you hit your head (even if there is no bleeding).  Some medicines may interact with Eliquis and might increase your risk of bleeding or clotting while on Eliquis. To help  avoid this, consult your healthcare provider or pharmacist prior to using any new prescription or non-prescription medications, including herbals, vitamins, non-steroidal anti-inflammatory drugs (NSAIDs) and supplements.  This website has more information on Eliquis (apixaban): http://www.eliquis.com/eliquis/home

## 2022-11-25 NOTE — Progress Notes (Signed)
Daily Progress Note Intern Pager: [phone]  Patient name: Arthur Ali Medical record number: [id number] Date of birth: Mar 23, 1960 Age: 62 y.o. Gender: male  Primary Care Provider: [name], MD Consultants: Oncology, IR  Code Status: Full Code   Pt Overview and Major Events to Date:  9/12: Admitted to [facility]MTS 9/13: Started Casodex (9/13-9/27), Decadron 4 mg (9/13-9/20) and Vit D 50,000u weekly 9/14: On Lupron  9/16: IR consulted for possible biopsy 9/18: Bone biopsy and Bone Scan scheduled today  Assessment and Plan: Arthur Ali is a 62 year old male who presented initially with B symptoms and had CTA that confirmed widespread sclerotic metastatic disease throughout the axial skeleton.  He additionally had a PSA of >1400.  Oncology was consulted. Assessment & Plan Prostate cancer metastatic to multiple sites [facility]) Patient has been lost to follow up for some time. Previous multiple myeloma panel in 2021 with elevated IgG and IgM, elevated total protein 9.5>10.2>7.9>7.2>8.1, and elevated alkaline phosphatase 560>503>497>500. CT abdomen and pelvis with contrast showed diffuse mottled sclerosis throughout visualized bony structures with left renal cortical atrophy and mild left sided obstructive uropathy.   TB was on the differential at admission due to unintentional weight loss, subjective fevers, and some coughs. However, there is low suspicion for TB at this time due to negative Quantiferon Gold - symptoms present at admission are likely due to diagnosis of metastatic prostate cancer vs possible PE/ linear density noted in the lower lobe branch of R pulmonary artery along with mediastinal adenopathy and retroperitoneal adenopathy consistency with metastatic disease/malignancy on the repeat CTPE.  - Regular diet and Ensure, RD on board - PT/OT to treat - Urology: Consulted regarding the obstructed uropathy. Recommends continued monitoring with ADT (Casodex and Lupron)  and doesn't think a stent would be possible with hx of non-compliance - Bone biopsy and bone scan pending - Oncology consulted, appreciate recs: - Once CT-guided biopsy of abdomen and pelvis is done we will need CT-guided biopsy of one of the accessible lesions to confirm tissue diagnosis. IR consulted, awaiting consent and biopsy - If biopsy site not available: treat based on clinical picture and elevated PSA levels & do outpatient Guardant36- for molecular profiling - Vit D level and start on high-dose Vit D 50,000u weekly for very significant hungry bone syndrome with high risk of hypocalcemia when started on bone directed therapies (bisphosphonate or Xgeva); dental eval first to prevent osteonecrosis of the jaw (discussed with outpt dentist, who doesn't think this would be possible without pan scan of mouth) - If patient gets hypercalcemic -- immediately proceed with bisphosphonate/Xgeva without dental evaluation - Casodex 50 mg PO daily x2 weeks (9/14-9/28) and then switch to [facility] upon oncology clinic follow up - Dexamethasone taper: 4 mg PO BID for 1 week (9/13-9/20) ---> 4 mg PO daily for 1 week (9/21-9/28) ---> 2 mg PO daily for 1 week (9/29-10/6) ---> stop Goals of care, counseling/discussion Currently has no insurance and has difficulty with transportation. Language barrier is also affecting his care.  - TOC for Medicaid assistance and transportation - FMLA paperwork for work and financial support - Consulted spiritual and palliative to discuss diagnosis and options further, appreciate assistance and recommendations Hyperkalemia (Resolved: 11/25/2022) Resolved after Lokelma, continue to monitor with BMP. Cancer related pain Oxycodone 10 mg every 4h as needed with Tylenol - Bowel regimen: Miralax BID and Senna daily  Chronic and Stable Problems: none   FEN/GI: Regular diet with RD on board PPx: Lovenox Dispo: Several days  pending oncology recommendations, unsure what can be done  outpatient versus inpatient but will defer to oncology expertise.  Subjective:  Used a Water engineer. Patient is doing well after his bone biopsy performed by IR today. He reports some lower back pain today likely secondary to the biopsy today. The pain is manageable. He is scheduled for his bone scan today as well. He reports that he had a bowel movement that relieved him.   Objective: Temp:  [97.9 F (36.6 C)-98.6 F (37 C)] 98.6 F (37 C) (09/18 0941) Pulse Rate:  [74-91] 90 (09/18 0920) Resp:  [13-19] 13 (09/18 0920) BP: (120-132)/(83-99) 120/83 (09/18 0941) SpO2:  [95 %-100 %] 99 % (09/18 0920) Physical Exam: General: Cachetic, awake and alert male in NAD HEENT: Normocephalic, atraumatic. Conjunctiva normal. No nasal discharge Cardiovascular: RRR. No M/R/G Respiratory: CTAB, normal WOB on RA. No wheezing, crackles, rhonchi, or diminished breath sounds. Abdomen: Soft, non-tender, non-distended. Bowel sounds normoactive Skin: Warm and dry.  Laboratory: Most recent CBC Lab Results  Component Value Date   WBC 10.3 11/25/2022   HGB 11.8 (L) 11/25/2022   HCT 36.3 (L) 11/25/2022   MCV 86.0 11/25/2022   PLT 251 11/25/2022   Most recent BMP    Latest Ref Rng & Units 11/25/2022    6:38 AM  BMP  Glucose 70 - 99 mg/dL 938   BUN 8 - 23 mg/dL 20   Creatinine 1.82 - 1.24 mg/dL 9.93   Sodium 716 - 967 mmol/L 131   Potassium 3.5 - 5.1 mmol/L 4.5   Chloride 98 - 111 mmol/L 99   CO2 22 - 32 mmol/L 26   Calcium 8.9 - 10.3 mg/dL 8.2     MM panel IgG: 8938>1017 IgM: 5102>585 Gamma Glob: 4.7>2.4 Globulin: 6.4>4.8  Quantiferon-TB: negative  Imaging/Diagnostic Tests: No new imaging.  Fortunato Curling, DO 11/25/2022, 1:37 PM PGY-1, [facility]  FPTS Intern pager: [phone], text pages welcome Secure chat group [facility] Teaching Service

## 2022-11-25 NOTE — Consult Note (Signed)
[facility]    Date of Admission:  11/19/2022     Reason for Consult: does patient need tb screening    Referring Provider: Burley Saver; infection prevention    Abx: none        Assessment: 62 yo smoker of more than 50 years (recently quit a few months prior), admitted for 1 month body pain mostly at bilateral legs/joints and back found to have diffuse axial metastatic lesion and one small pulm nodule, in setting of psa 1400  Patient pain improving after starting on androgen deprivation therapy   There is a biopsy of a right posterior iliac bone done today  Urology following  I agree with primary team/urology this is all likely metastatic prostate cancer   He immigrated to the [place] from [place] 3 years ago and had negative tb screen at that time. He had no known exposure or active family members with tb. He has a one-year intermittent transient cough of a few seconds here and there, but no persistent daily productive cough. He thinks he lost some weight the past 1 months, attributing to pain, but reports appetite is better thoughout this admission. He has intermittent sweat and feeling warm but no daily drenching nightsweat   There is no fever here or reported documented fever at home   In the grand scheme of thing, we have a strong alternative diagnosis and I don't see reason to workup for tb    Plan: No indication for tb w/u F/u malignancy w/u per urology and primary team Id will sign off Discussed with primary team      ------------------------------------------------ Principal Problem:   Prostate cancer metastatic to multiple sites [facility]) Active Problems:   Goals of care, counseling/discussion   Cancer related pain    HPI: Arthur Ali is a 62 y.o. male 50 years (recently quit a few months prior), admitted for 1 month body pain mostly at bilateral legs/joints and back found to have diffuse axial metastatic lesion  and a small pulm nodule, in setting of psa 1400  Hx obtained from chart and via discussion with patient with help of phone language interpreter -- he speaks Kinyarwanda  Patient came in due to a month of body ache, decreased appetite. Body ache mainly within joints of the legs and diffuse in back and also abd pain. He thinks he had lost some weight but not sure how many, for the past month  He has a year of intermittent cough occurring rarely per month and not productive, very brief when it does for a few seconds.   He has no fever  He feels warm and has sweat intermittently at home for about 3 months but no drenching nightsweat  He was screened (negative) for tb exposure prior to immigrating to the [place] from [place]. He grew up from [place] but moved to [place] due to war. 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[place], but here worked in [employer]  Denies fever  No n/v/diarrhea No rash No active headache No dysuria   Here psa is 1400 Chest, abd pelv ct showed diffuse axial metastatic lesions and small < 1cm pulm nodule lul and mediastinal lymphadenopathy and possible pe He had ir biopsy of sacral iliac today  He was given antiandrogen deprivation therapy within 24 hours admission and his pain is better  His appetite is also better  Family History  Family history unknown: Yes    Social History   Tobacco Use   Smoking status: Former    Types: Cigars, Cigarettes   Smokeless tobacco: Never   Tobacco comments:    Quit in 2022.   Vaping Use   Vaping status: Never Used  Substance Use Topics   Alcohol use: Never   Drug use: Never    No Known Allergies  Review of Systems: ROS All Other ROS was negative, except mentioned above   Past Medical History:  Diagnosis Date   Back pain    Hypertension        Scheduled Meds:  acetaminophen  1,000 mg Oral Q8H   apixaban  10 mg Oral BID   bicalutamide  50 mg Oral Daily    dexamethasone  4 mg Oral Q12H   Followed by   Melene Muller ON 11/28/2022] dexamethasone  4 mg Oral Daily   Followed by   Melene Muller ON 12/05/2022] dexamethasone  2 mg Oral Daily   feeding supplement  237 mL Oral TID BM   lidocaine  1 patch Transdermal Q24H   polyethylene glycol  34 g Oral BID   senna  1 tablet Oral BID   Vitamin D (Ergocalciferol)  50,000 Units Oral Q7 days   Continuous Infusions: PRN Meds:.influenza vac split trivalent PF, ondansetron (ZOFRAN) IV, oxyCODONE   OBJECTIVE: Blood pressure 120/83, pulse 90, temperature 98.6 F (37 C), temperature source Oral, resp. rate 13, height 5\' 9"  (1.753 m), SpO2 99%.  Physical Exam    Lab Results Lab Results  Component Value Date   WBC 10.3 11/25/2022   HGB 11.8 (L) 11/25/2022   HCT 36.3 (L) 11/25/2022   MCV 86.0 11/25/2022   PLT 251 11/25/2022    Lab Results  Component Value Date   CREATININE 0.71 11/25/2022   BUN 20 11/25/2022   NA 131 (L) 11/25/2022   K 4.5 11/25/2022   CL 99 11/25/2022   CO2 26 11/25/2022    Lab Results  Component Value Date   ALT 19 11/21/2022   AST 24 11/21/2022   ALKPHOS 500 (H) 11/21/2022   BILITOT 0.4 11/21/2022      Microbiology: No results found for this or any previous visit (from the past 240 hour(s)).   Serology:    Imaging: If present, new imagings (plain films, ct scans, and mri) have been personally visualized and interpreted; radiology reports have been reviewed. Decision making incorporated into the Impression / Recommendations.  9/17 ct angio chest Linear density is noted in a lower lobe branch of the right pulmonary artery. It is uncertain if this represents pulmonary embolus or potentially artifact from adjacent bronchial wall thickening and inflammation and probable aspirated material.   There is again noted mediastinal adenopathy and probable retroperitoneal adenopathy consistent with metastatic disease or malignancy.   As noted above, probable aspirated material or  mucous plugging is noted in right lower lobe bronchi with associated wall thickening suggesting some degree of associated bronchitis.   3 mm nodule noted in right lung apex. Given the history of malignancy, metastatic disease cannot be excluded.   Stable diffuse osseous metastatic disease is noted.    9/14 ct abd pelv 1. Diffuse mottled sclerosis throughout the visualized bony structures, compatible with diffuse bony metastatic disease in this patient with a history of prostate cancer. 2. Left renal cortical atrophy, with mild left-sided obstructive uropathy. The exact source of obstruction is not well visualized, but could be due to an enlarged prostate  causing mass effect at the left UVJ. 3. Borderline enlarged retroperitoneal and pelvic adenopathy as above, metastatic disease not excluded. 4. Heterogeneous enlarged prostate.   9/12 ct chest angio 1. No pulmonary embolism. 2. Pathologic thoracic adenopathy within the right paratracheal and subcarinal lymph node groups. Suspected retrocrural and perirenal adenopathy within the upper abdomen, not well delineated given the lack of intra-abdominal fat and phase of contrast enhancement. Widespread sclerotic metastatic disease throughout the visualized axial skeleton. Findings are in keeping with metastatic disease and correlation with serum PSA level may be helpful for further management. Dedicated nonemergent standard CT examination of the abdomen pelvis or PET CT examination may also be helpful for further evaluation. 3. 6 mm noncalcified pulmonary nodules within the right upper lobe. Follow-up imaging will be dependent upon the patient's underlying malignancy and treatment plan. 4. Mild central pulmonary arterial enlargement in keeping with changes of pulmonary arterial hypertension. Mild relative enlargement of the right ventricle and shift of the intraventricular septum to the left suggesting elevated right heart pressure.         Raymondo Band, MD [facility] [phone] pager    11/25/2022, 3:15 PM

## 2022-11-25 NOTE — Assessment & Plan Note (Addendum)
Oxycodone 10 mg every 4h as needed with Tylenol - Bowel regimen: Miralax BID and Senna daily

## 2022-11-25 NOTE — Assessment & Plan Note (Signed)
Currently has no insurance and has difficulty with transportation. Language barrier is also affecting his care.  - TOC for Medicaid assistance and transportation - FMLA paperwork for work and financial support - Consulted spiritual and palliative to discuss diagnosis and options further, appreciate assistance and recommendations

## 2022-11-25 NOTE — Sedation Documentation (Signed)
[employer] interpreter used before and after procedure

## 2022-11-25 NOTE — Discharge Summary (Incomplete)
Family Medicine Teaching [facility] Discharge Summary  Patient name: Arthur Ali Medical record number: [id number] Date of birth: 09/04/1960 Age: 62 y.o. Gender: male Date of Admission: 11/19/2022  Date of Discharge: 11/27/22  Admitting Physician: Arthur Brigham, MD  Primary Care Provider: [name], MD Consultants: Oncology, Urology, IR, [facility]  Indication for Hospitalization: Unintentional Weight Loss and Dyspnea  Brief Hospital Course:  Arthur Ali is a 62 y.o.male with a history of lymphocytosis who was admitted to the [facility] Teaching Service at [facility] for dyspnea & weight loss. His hospital course is detailed below:  Metastatic Prostate Cancer Patient presented to the [facility] with worsening dyspnea and unintentional weight loss in the setting of elevated serum protein and alkaline phosphatase previously with concern for MM, was lost to follow up for a period of time. Patient was directly admitted to service for his symptoms and concern of malignancy. CTA chest with widespread sclerotic metastatic disease throughout axial skeleton, and 6mm noncalicified pulmonary nodules within RUL. PSA was elevated, concerning for prostate cancer as primary source of cancer. Oncology was consulted who recommended CTAP and whole body bone scan. CTAP consistent with metastatic prostate cancer and left sided urinary obstruction. Oncology started multiple medications for this patient, listed below with plans to adjust medications outpatient: - Lupron (9/14) + Casodex (9/13-9/27) and then will likely transition to [facility] per outpatient oncology follow-up.  - Vitamin D 50,000u started weekly on 9/13 as well - Dexamethasone for skeletal discomfort which will be tapered weekly:      - 4 mg PO BID for 1 week (9/13-9/20)      - 4 mg PO daily for 1 week (9/21-9/28)       - 2 mg PO daily for 1 week (9/29-10/6)    Urology also evaluated pt for hydronephrosis, recommended  monitoring as it may resolve with ADT alone, did not want to place a stent. Bone lesion biopsy done 9/18 of the posterior right iliac bone along with whole body bone scan recommended by oncology (Dr. Candise Ali), pathology results pending. Bone scan demonstrated diffuse osseous metastatic disease throughout the appendicular and axial skeleton. Osseous metastatic disease involves the proximal femurs bilaterally along with left-sided hydronephrosis.  Spoke to dentistry as he requires assessment prior to initiation of bisphosphonates per oncology. They were unable to come into the hospital and could not do full assessment as the appropriate XR for assessment is not accessible in hospital. Will try to have him follow up with dentistry outpatient.   Possible PE vs Density of CTPE During hospitalization, patient developed left sided rib pleuritic pain that woke him from sleep and difficulty breathing. A repeat CTAPE was done which demonstrated possible PE/ linear density noted in the lower lobe branch of R pulmonary artery along with mediastinal adenopathy and retroperitoneal adenopathy consistency with metastatic disease/malignancy. After discussion with radiology, the defect was a possible small PE, that per radiology would not induce right heart strain. Was started on heparin gtt and transitioned to Eliquis for 3 months. Eliquis 10 mg BID (9/18-9/25) and 5 mg thereafter till 12/18. Seems PE was likely provoked by metastasis in setting of current hospitalization.   SDOH Patient has no insurance, difficulty paying rent/bills, and obtaining transportation. Language barrier and limited health literacy is also affecting his care. Arthur Ali was assisting patient with finding a roommate of similar background to help with financial status since he doesn't qualify for assistance. FMLA paperwork required for work due to concern of losing his  job due to being out of work and in the hospital. Prior to discharge, he had  extensive counseling about medications with in person interpreter with assistance of pharmacy.   PCP Follow-up Recommendations: Ensure outpatient dentistry established Ensure oncology follow up with Dr. Candise Ali (9/30) Continued GOC discussions, HCPOA, etc Reassess need for continued anticoagulation after duration of therapy FMLA paperwork Determine if patient is taking Decadron taper appropriately Repeat BMP, CBC at follow up   Discharge Diagnoses/Problem List:  Principal Problem:   Prostate cancer metastatic to multiple sites [facility]) Active Problems:   Goals of care, counseling/discussion   Cancer related pain  Disposition: Home  Discharge Condition: Stable  Discharge Exam: General: Cachetic, awake and alert male in NAD HEENT: Normocephalic, atraumatic. Conjunctiva normal. No nasal discharge Cardiovascular: RRR. No M/R/G Respiratory: CTAB, normal WOB on RA. No wheezing, crackles, rhonchi, or diminished breath sounds. Abdomen: Soft, non-tender, non-distended. Bowel sounds normoactive Skin: Warm and dry.  Significant Procedures: Bone biopsy and whole body scan  Significant Labs and Imaging:  Recent Labs  Lab 11/25/22 0638 11/26/22 0356  WBC 10.3 9.7  HGB 11.8* 11.2*  HCT 36.3* 35.3*  PLT 251 263   Recent Labs  Lab 11/25/22 0638  NA 131*  K 4.5  CL 99  CO2 26  GLUCOSE 103*  BUN 20  CREATININE 0.71  CALCIUM 8.2*   PSA: 1444.22 Updated MM panel to compare to previous, shows improvement  Bone Scan 11/24/22:  IMPRESSION: 1. Diffuse osseous metastatic disease throughout the appendicular and axial skeleton. Osseous metastatic disease involves the proximal femurs bilaterally. 2. LEFT-sided hydronephrosis.  Mottled increased radiotracer uptake throughout the calvarium, bilateral ribs, sternum, pelvis, bilateral proximal humeri, and bilateral proximal femurs are consistent with osseous metastatic disease. No significant uptake is identified within the  kidneys, consistent with diffuse osseous metastatic disease.  CXR 11/05/22: No active disease   Lspine XR 09/12:  IMPRESSION: 1. Mild degenerative changes of the lumbar spine. 2. Diffuse mottled appearance of the femoral heads and pelvis, which may be due to osteopenia, metastatic disease, or metabolic bone disease.  CTA PE from 11/19/22  IMPRESSION: 1. No pulmonary embolism. 2. Pathologic thoracic adenopathy within the right paratracheal and subcarinal lymph node groups. Suspected retrocrural and perirenal adenopathy within the upper abdomen, not well delineated given the lack of intra-abdominal fat and phase of contrast enhancement. Widespread sclerotic metastatic disease throughout the visualized axial skeleton. Findings are in keeping with metastatic disease and correlation with serum PSA level may be helpful for further management. Dedicated nonemergent standard CT examination of the abdomen pelvis or PET CT examination may also be helpful for further evaluation. 3. 6 mm noncalcified pulmonary nodules within the right upper lobe. Follow-up imaging will be dependent upon the patient's underlying malignancy and treatment plan. 4. Mild central pulmonary arterial enlargement in keeping with changes of pulmonary arterial hypertension. Mild relative enlargement of the right ventricle and shift of the intraventricular septum to the left suggesting elevated right heart pressure.  CT abd pelvis 11/21/22: IMPRESSION: 1. Diffuse mottled sclerosis throughout the visualized bony structures, compatible with diffuse bony metastatic disease in this patient with a history of prostate cancer. 2. Left renal cortical atrophy, with mild left-sided obstructive uropathy. The exact source of obstruction is not well visualized, but could be due to an enlarged prostate causing mass effect at the left UVJ. 3. Borderline enlarged retroperitoneal and pelvic adenopathy as above, metastatic disease  not excluded. 4. Heterogeneous enlarged prostate. 5.  Aortic Atherosclerosis (ICD10-I70.0). 6. Marked cachexia.  11/24/22 CTA PE:  IMPRESSION: Linear density is noted in a lower lobe branch of the right pulmonary artery. It is uncertain if this represents pulmonary embolus or potentially artifact from adjacent bronchial wall thickening and inflammation and probable aspirated material.   There is again noted mediastinal adenopathy and probable retroperitoneal adenopathy consistent with metastatic disease or malignancy.   As noted above, probable aspirated material or mucous plugging is noted in right lower lobe bronchi with associated wall thickening suggesting some degree of associated bronchitis.   3 mm nodule noted in right lung apex. Given the history of malignancy, metastatic disease cannot be excluded.   Stable diffuse osseous metastatic disease is noted.   Aortic Atherosclerosis (ICD10-I70.0) and Emphysema (ICD10-J43.9).     Results/Tests Pending at Time of Discharge: Bone biopsy   Discharge Medications:  Allergies as of 11/26/2022   No Known Allergies      Medication List     STOP taking these medications    azithromycin 250 MG tablet Commonly known as: ZITHROMAX       TAKE these medications    Acetaminophen Extra Strength 500 MG Tabs Take 2 tablets (1,000 mg total) by mouth every 8 (eight) hours for 5 days then as directed by MD   bicalutamide 50 MG tablet Commonly known as: CASODEX Take 1 tablet (50 mg total) by mouth daily.   dexamethasone 2 MG tablet Commonly known as: DECADRON Take 2 tablets (4 mg total) by mouth every 12 (twelve) hours for 3 days, THEN 2 tablets (4 mg total) daily for 7 days, THEN 1 tablet (2 mg total) daily for 7 days. Start taking on: November 25, 2022   Eliquis DVT/PE Starter Pack Generic drug: Apixaban Starter Pack (10mg  and 5mg ) Take as directed on package: start with two-5mg  tablets twice daily for 7 days. On day 8,  switch to one-5mg  tablet twice daily.   feeding supplement Liqd Take 237 mLs by mouth 3 (three) times daily between meals.   oxyCODONE 5 MG immediate release tablet Commonly known as: Oxy IR/ROXICODONE Take 1 tablet (5 mg total) by mouth every 4 (four) hours as needed for severe pain.   polyethylene glycol 17 g packet Commonly known as: MIRALAX / GLYCOLAX Take 34 g by mouth 2 (two) times daily.   Vitamin D (Ergocalciferol) 1.25 MG (50000 UNIT) Caps capsule Commonly known as: DRISDOL Take 1 capsule (50,000 Units total) by mouth every 7 (seven) days. Start taking on: November 27, 2022        Discharge Instructions: Please refer to Patient Instructions section of EMR for full details.  Patient was counseled important signs and symptoms that should prompt return to medical care, changes in medications, dietary instructions, activity restrictions, and follow up appointments.   Follow-Up Appointments: 9/30 at 11 am: Oncology appointment w/ Dr. Candise Ali 10/7 at 11:10 am: Dr. Sherre Scarlet follow-up  Fortunato Curling DO  11/26/2022, 9:56 AM PGY-1, [facility]  Upper Level Addendum:  I have reviewed the above note, making necessary revisions as appropriate.  I agree with the medical decision making as noted above.  Alfredo Martinez, MD PGY-3 [facility] Family [facility] Residency

## 2022-11-26 ENCOUNTER — Other Ambulatory Visit (HOSPITAL_COMMUNITY): Payer: Self-pay

## 2022-11-26 DIAGNOSIS — C7951 Secondary malignant neoplasm of bone: Secondary | ICD-10-CM

## 2022-11-26 DIAGNOSIS — R000 Tachycardia, unspecified: Secondary | ICD-10-CM | POA: Diagnosis not present

## 2022-11-26 DIAGNOSIS — G893 Neoplasm related pain (acute) (chronic): Secondary | ICD-10-CM | POA: Diagnosis not present

## 2022-11-26 DIAGNOSIS — C61 Malignant neoplasm of prostate: Principal | ICD-10-CM

## 2022-11-26 LAB — CBC
HCT: 35.3 % — ABNORMAL LOW (ref 39.0–52.0)
Hemoglobin: 11.2 g/dL — ABNORMAL LOW (ref 13.0–17.0)
MCH: 27.1 pg (ref 26.0–34.0)
MCHC: 31.7 g/dL (ref 30.0–36.0)
MCV: 85.5 fL (ref 80.0–100.0)
Platelets: 263 10*3/uL (ref 150–400)
RBC: 4.13 MIL/uL — ABNORMAL LOW (ref 4.22–5.81)
RDW: 13.7 % (ref 11.5–15.5)
WBC: 9.7 10*3/uL (ref 4.0–10.5)
nRBC: 0.4 % — ABNORMAL HIGH (ref 0.0–0.2)

## 2022-11-26 NOTE — Plan of Care (Signed)

## 2022-11-26 NOTE — Progress Notes (Signed)
FMTS Interim Progress Note  Placed discharge order for the patient for TOC to bring medications to bedside but spoke to nursing that we will not discharge patient until pharmacy teaching with medications to patient at bedside. Nursing verbalized understanding. Hospital follow up scheduled. Discharge summary to come.    Alfredo Martinez, MD 11/26/2022, 8:50 AM PGY-3, [facility] pager [phone]

## 2022-11-26 NOTE — Progress Notes (Addendum)
Physical Therapy Treatment Patient Details Name: Arthur Ali MRN: [id number] DOB: 06/28/1960 Today's Date: 11/26/2022   History of Present Illness Pt is a 62 y/o M admitted on 11/19/22 after presenting with c/o worsening dyspnea, chest pain, night sweats, loss of appetite & unintentional weight loss. 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 PMH: back pain, HTN    PT Comments  Pt in bed upon arrival and agreeable to PT session. Worked on gait training and LE strength in today's session. Pt ambulates short distances with no AD and slow and steady gait. Pt has decreased balance as seen with needing MinA during single leg activities. Pt requires bilateral UE support during exercises to prevent LOB. Pt would benefit and was interested in OP PT to address balance impairments, decreased endurance, and decreased strength. Pt is well progressing towards goals. Acute PT to follow.      If plan is discharge home, recommend the following: Assistance with cooking/housework;Assist for transportation;A little help with walking and/or transfers;A little help with bathing/dressing/bathroom   Can travel by private vehicle        Equipment Recommendations  None recommended by PT    Recommendations for Other Services       Precautions / Restrictions Precautions Precautions: Fall Restrictions Weight Bearing Restrictions: No     Mobility  Bed Mobility Overal bed mobility: Independent             General bed mobility comments: increased time    Transfers Overall transfer level: Needs assistance Equipment used: None Transfers: Sit to/from Stand Sit to Stand: Supervision           General transfer comment: increased time    Ambulation/Gait Ambulation/Gait assistance: Supervision Gait Distance (Feet): 80 Feet Assistive device: None Gait Pattern/deviations: WFL(Within Functional Limits),  Decreased stride length Gait velocity: decreased     General Gait Details: slow and steady gait with short steps         Balance Overall balance assessment: Needs assistance Sitting-balance support: No upper extremity supported, Feet supported Sitting balance-Leahy Scale: Good     Standing balance support: During functional activity, No upper extremity supported Standing balance-Leahy Scale: Fair Standing balance comment: supervision no device       Cognition Arousal: Alert Behavior During Therapy: WFL for tasks assessed/performed Overall Cognitive Status: Difficult to assess        Exercises General Exercises - Lower Extremity Hip ABduction/ADduction: AROM, Standing, Both, 5 reps Hip Flexion/Marching: AROM, Standing, Both, 5 reps Other Exercises Other Exercises: B hip ext, x5    General Comments General comments (skin integrity, edema, etc.): VSS on RA      Pertinent Vitals/Pain Pain Assessment Pain Assessment: No/denies pain     PT Goals (current goals can now be found in the care plan section) Progress towards PT goals: Progressing toward goals    Frequency    Min 1X/week       AM-PAC PT "6 Clicks" Mobility   Outcome Measure  Help needed turning from your back to your side while in a flat bed without using bedrails?: None Help needed moving from lying on your back to sitting on the side of a flat bed without using bedrails?: None Help needed moving to and from a bed to a chair (including a wheelchair)?: A Little Help needed standing up from a chair using your arms (e.g., wheelchair or bedside chair)?: A Little Help needed to walk in hospital room?: A Little  Help needed climbing 3-5 steps with a railing? : A Little 6 Click Score: 20    End of Session   Activity Tolerance: Patient tolerated treatment well Patient left: with call bell/phone within reach;in bed (bed alarm off upon arrival, pt agrees to call nursing for mobility) Nurse  Communication: Mobility status PT Visit Diagnosis: Muscle weakness (generalized) (M62.81);Other abnormalities of gait and mobility (R26.89)     Time: 8295-6213 PT Time Calculation (min) (ACUTE ONLY): 19 min  Charges:    $Therapeutic Exercise: 8-22 mins PT General Charges $$ ACUTE PT VISIT: 1 Visit                    Arthur Ali, PT, DPT Secure Chat Preferred  Rehab Office [phone]    Arthur Ali 11/26/2022, 9:49 AM

## 2022-11-26 NOTE — Progress Notes (Signed)
Delivered TOC medications to patient, and completed discharge medication counseling with interpreter Joscelyn at bedside. All medication instructions were written out in [place] and explained to patient.  Medications instructions were written on each prescription bottle on a piece of tape. Each bottle was also color-coded and instructions were written next to corresponding color on additional piece of paper.  Dexamethasone taper instructions were written out on blank calendar. Also demonstrated how to utilize Eliquis VTE starter pack.  Informed patient of upcoming appointments with oncology on 9/30 and family medicine on 10/7. Circled these appointments on the AVS.  AVS, dexamethasone taper calendar, and medication instruction sheet were placed in TOC medication bag. Patient voiced understanding and appreciation of utilizing an in-person interpreter.  Nicole Kindred, PharmD PGY1 Pharmacy Resident 11/26/2022 1:34 PM

## 2022-11-26 NOTE — TOC Transition Note (Signed)
Transition of Care [facility]) - CM/SW Discharge Note   Patient Details  Name: Arthur Ali MRN: [id number] Date of Birth: 10/26/1960  Transition of Care [facility]) CM/SW Contact:  Gala Lewandowsky, RN Phone Number: 11/26/2022, 10:40 AM   Clinical Narrative: Patient will transition home today. Interpreter Joscelyn @ the bedside. Case Manager discussed home health needs and co pay; patient is agreeable to services with [facility]. Referral made to [facility] for PT/SW. Previously CSW has submitted Medicaid Screen to [facility]. If patient is approved for Medicaid the insurance can assist with home health services moving forward. CSW to assist with transportation needs in the community to appointments and additional community resources. Patient will transport via taxi home. No further home needs identified.   Final next level of care: Home w Home Health Services Barriers to Discharge: No Barriers Identified  Patient Goals and CMS Choice   Choice offered to / list presented to : Patient (via Interpreter Joscelyn)  Discharge Plan and Services Additional resources added to the After Visit Summary for   In-house Referral: Artist, Interpreting Services Discharge Planning Services: CM Consult Post Acute Care Choice: Home Health            HH Arranged: PT, Social Work [facility] Agency: [facility] Date [facility] Agency Contacted: 11/26/22 Time HH Agency Contacted: 1039 Representative spoke with at [facility] Agency: Tresa Endo  Social Determinants of Health (SDOH) Interventions SDOH Screenings   Food Insecurity: No Food Insecurity (11/19/2022)  Housing: Low Risk  (11/19/2022)  Transportation Needs: No Transportation Needs (11/19/2022)  Utilities: Not At Risk (11/19/2022)  Depression (PHQ2-9): Low Risk  (11/19/2022)  Tobacco Use: Medium Risk (11/24/2022)   Readmission Risk Interventions     No data to display

## 2022-11-26 NOTE — Plan of Care (Signed)
Problem: Health Behavior/Discharge Planning: Goal: Ability to manage health-related needs will improve Outcome: Progressing   Problem: Activity: Goal: Risk for activity intolerance will decrease Outcome: Progressing   Problem: Nutrition: Goal: Adequate nutrition will be maintained Outcome: Progressing   Problem: Coping: Goal: Level of anxiety will decrease Outcome: Progressing   Problem: Skin Integrity: Goal: Risk for impaired skin integrity will decrease Outcome: Progressing

## 2022-11-26 NOTE — Plan of Care (Signed)
  Problem: Education: Goal: Knowledge of General Education information will improve Description: Including pain rating scale, medication(s)/side effects and non-pharmacologic comfort measures 11/26/2022 0959 by Herma Carson, RN Outcome: Adequate for Discharge 11/26/2022 0749 by Herma Carson, RN Outcome: Progressing   Problem: Health Behavior/Discharge Planning: Goal: Ability to manage health-related needs will improve 11/26/2022 0959 by Herma Carson, RN Outcome: Adequate for Discharge 11/26/2022 0749 by Herma Carson, RN Outcome: Progressing   Problem: Clinical Measurements: Goal: Ability to maintain clinical measurements within normal limits will improve 11/26/2022 0959 by Herma Carson, RN Outcome: Adequate for Discharge 11/26/2022 0749 by Herma Carson, RN Outcome: Progressing Goal: Will remain free from infection 11/26/2022 0959 by Herma Carson, RN Outcome: Adequate for Discharge 11/26/2022 0749 by Herma Carson, RN Outcome: Progressing Goal: Diagnostic test results will improve 11/26/2022 0959 by Herma Carson, RN Outcome: Adequate for Discharge 11/26/2022 0749 by Herma Carson, RN Outcome: Progressing Goal: Respiratory complications will improve 11/26/2022 0959 by Herma Carson, RN Outcome: Adequate for Discharge 11/26/2022 0749 by Herma Carson, RN Outcome: Progressing Goal: Cardiovascular complication will be avoided 11/26/2022 0959 by Herma Carson, RN Outcome: Adequate for Discharge 11/26/2022 0749 by Herma Carson, RN Outcome: Progressing   Problem: Activity: Goal: Risk for activity intolerance will decrease 11/26/2022 0959 by Herma Carson, RN Outcome: Adequate for Discharge 11/26/2022 0749 by Herma Carson, RN Outcome: Progressing   Problem: Nutrition: Goal: Adequate nutrition will be maintained 11/26/2022 0959 by Herma Carson, RN Outcome: Adequate for Discharge 11/26/2022 0749 by Herma Carson, RN Outcome: Progressing    Problem: Coping: Goal: Level of anxiety will decrease 11/26/2022 0959 by Herma Carson, RN Outcome: Adequate for Discharge 11/26/2022 0749 by Herma Carson, RN Outcome: Progressing   Problem: Elimination: Goal: Will not experience complications related to bowel motility 11/26/2022 0959 by Herma Carson, RN Outcome: Adequate for Discharge 11/26/2022 0749 by Herma Carson, RN Outcome: Progressing   Problem: Pain Managment: Goal: General experience of comfort will improve 11/26/2022 0959 by Herma Carson, RN Outcome: Adequate for Discharge 11/26/2022 0749 by Herma Carson, RN Outcome: Progressing   Problem: Safety: Goal: Ability to remain free from injury will improve 11/26/2022 0959 by Herma Carson, RN Outcome: Adequate for Discharge 11/26/2022 0749 by Herma Carson, RN Outcome: Progressing   Problem: Skin Integrity: Goal: Risk for impaired skin integrity will decrease 11/26/2022 0959 by Herma Carson, RN Outcome: Adequate for Discharge 11/26/2022 0749 by Herma Carson, RN Outcome: Progressing   Problem: Acute Rehab PT Goals(only PT should resolve) Goal: Pt Will Transfer Bed To Chair/Chair To Bed Outcome: Adequate for Discharge Goal: Pt Will Ambulate Outcome: Adequate for Discharge Goal: Pt/caregiver will Perform Home Exercise Program Outcome: Adequate for Discharge   Problem: Increased Nutrient Needs (NI-5.1) Goal: Food and/or nutrient delivery Description: Individualized approach for food/nutrient provision. Outcome: Adequate for Discharge

## 2022-11-27 ENCOUNTER — Telehealth: Payer: Self-pay

## 2022-11-27 LAB — SURGICAL PATHOLOGY

## 2022-11-27 NOTE — Transitions of Care (Post Inpatient/ED Visit) (Unsigned)
11/27/2022  Name: Arthur Ali MRN: [id number] DOB: 07-27-1960  Today's TOC FU Call Status: Today's TOC FU Call Status:: Unsuccessful Call (1st Attempt) Unsuccessful Call (1st Attempt) Date: 11/27/22  Attempted to reach the patient regarding the most recent Inpatient/ED visit.  Follow Up Plan: Additional outreach attempts will be made to reach the patient to complete the Transitions of Care (Post Inpatient/ED visit) call.   Signature Karena Addison, LPN [facility] Nurse Health Advisor Direct Dial [phone]

## 2022-11-30 NOTE — Congregational Nurse Program (Signed)
  Dept: [phone]   Congregational Nurse Program Note  Date of Encounter: 11/30/2022  Past Medical History: Past Medical History:  Diagnosis Date   Back pain    Hypertension     Encounter Details:  CNP Questionnaire - 11/30/22 1851       Questionnaire   Ask client: Do you give verbal consent for me to treat you today? 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    Insurance/Financial Assistance Referral N/A    Medication Have Medication Insecurities    Medical Provider Yes    Screening Referrals Made N/A    Medical Referrals Made N/A    Medical Appointment Made N/A    Recently w/o PCP, now 1st time PCP visit completed due to CNs referral or appointment made Yes    Food Have Food Insecurities    Transportation Need transportation assistance;Provided transportation assistance    Housing/Utilities Unable to pay for utilities;Referred to housing/utility assistance program    Interpersonal Safety N/A    Interventions Advocate/Support;Navigate Healthcare System;Case Management;Counsel;Educate;Reviewed Medications    Abnormal to Normal Screening Since Last CN Visit N/A    Screenings CN Performed N/A    Sent Client to Lab for: N/A    Did client attend any of the following based off CNs referral or appointments made? N/A    ED Visit Averted Yes    Life-Saving Intervention Made N/A           Contacted patient for follow up. Patient at home resting. Informed patient of follow up appointments.I will provide transportation to appointments.  Nicole Cella Ferris Fielden RN BSN PCCN  [facility] [phone]-cell [phone]-office

## 2022-12-01 NOTE — Transitions of Care (Post Inpatient/ED Visit) (Signed)
12/01/2022  Name: Arthur Ali MRN: [id number] DOB: 07/18/1960  Today's TOC FU Call Status: Today's TOC FU Call Status:: Successful TOC FU Call Completed Unsuccessful Call (1st Attempt) Date: 11/27/22 [facility] FU Call Complete Date: 12/01/22 Patient's Name and Date of Birth confirmed.  Transition Care Management Follow-up Telephone Call Date of Discharge: 11/26/22 Discharge Facility: [facility]) Type of Discharge: Inpatient Admission Primary Inpatient Discharge Diagnosis:: neoplasm How have you been since you were released from the hospital?: Better Any questions or concerns?: No  Items Reviewed: Did you receive and understand the discharge instructions provided?: Yes Medications obtained,verified, and reconciled?: Yes (Medications Reviewed) Any new allergies since your discharge?: No Dietary orders reviewed?: Yes Do you have support at home?: Yes People in Home: child(ren), adult  Medications Reviewed Today: Medications Reviewed Today     Reviewed by Karena Addison, LPN (Licensed Practical Nurse) on 12/01/22 at 1518  Med List Status: <None>   Medication Order Taking? Sig Documenting Provider Last Dose Status Informant  acetaminophen (TYLENOL) 500 MG tablet [id number]  Take 2 tablets (1,000 mg total) by mouth every 8 (eight) hours for 5 days then as directed by MD Lincoln Brigham, MD  Active   APIXABAN Everlene Balls) VTE STARTER PACK (10MG  AND 5MG ) [id number]  Take as directed on package: start with two-5mg  tablets twice daily for 7 days. On day 8, switch to one-5mg  tablet twice daily. Lincoln Brigham, MD  Active   bicalutamide (CASODEX) 50 MG tablet [id number]  Take 1 tablet (50 mg total) by mouth daily. Lincoln Brigham, MD  Active   dexamethasone (DECADRON) 2 MG tablet [id number]  Take 2 tablets (4 mg total) by mouth every 12 (twelve) hours for 3 days, THEN 2 tablets (4 mg total) daily for 7 days, THEN 1 tablet (2 mg total) daily for 7 days. Lincoln Brigham, MD  Active   feeding supplement  (ENSURE ENLIVE / ENSURE PLUS) LIQD [id number]  Take 237 mLs by mouth 3 (three) times daily between meals. Lincoln Brigham, MD  Active   oxyCODONE (OXY IR/ROXICODONE) 5 MG immediate release tablet [id number]  Take 1 tablet (5 mg total) by mouth every 4 (four) hours as needed for severe pain.   Active   polyethylene glycol (MIRALAX / GLYCOLAX) 17 g packet [id number]  Take 34 g by mouth 2 (two) times daily. Lincoln Brigham, MD  Active   Vitamin D, Ergocalciferol, (DRISDOL) 1.25 MG (50000 UNIT) CAPS capsule [id number]  Take 1 capsule (50,000 Units total) by mouth every 7 (seven) days. Lincoln Brigham, MD  Active             Home Care and Equipment/Supplies: Were Home Health Services Ordered?: NA Any new equipment or medical supplies ordered?: NA  Functional Questionnaire: Do you need assistance with bathing/showering or dressing?: No Do you need assistance with meal preparation?: No Do you need assistance with eating?: No Do you have difficulty maintaining continence: No Do you need assistance with getting out of bed/getting out of a chair/moving?: No Do you have difficulty managing or taking your medications?: No  Follow up appointments reviewed: PCP Follow-up appointment confirmed?: Yes Date of PCP follow-up appointment?: 12/14/22 [facility] Follow-up appointment confirmed?: NA Do you need transportation to your follow-up appointment?: No Do you understand care options if your condition(s) worsen?: Yes-patient verbalized understanding    SIGNATURE Karena Addison, LPN [facility] Nurse Health Advisor Direct Dial [phone]

## 2022-12-02 ENCOUNTER — Ambulatory Visit: Payer: BC Managed Care – PPO | Admitting: Family Medicine

## 2022-12-02 ENCOUNTER — Ambulatory Visit (INDEPENDENT_AMBULATORY_CARE_PROVIDER_SITE_OTHER): Payer: BC Managed Care – PPO | Admitting: Student

## 2022-12-02 VITALS — BP 113/75 | HR 98 | Wt 119.8 lb

## 2022-12-02 DIAGNOSIS — C7951 Secondary malignant neoplasm of bone: Secondary | ICD-10-CM

## 2022-12-02 DIAGNOSIS — D72820 Lymphocytosis (symptomatic): Secondary | ICD-10-CM

## 2022-12-02 DIAGNOSIS — C61 Malignant neoplasm of prostate: Secondary | ICD-10-CM | POA: Diagnosis not present

## 2022-12-02 NOTE — Progress Notes (Signed)
SUBJECTIVE:   CHIEF COMPLAINT / HPI:   Hospital Follow-up for Metastatic Prostate Cancer Recently admitted for dyspnea and unintentional weight loss. Seen by ONC in the hospital and started on Lupron and Casodex with plan for outpatient transition to [facility]. Also found to have hydronephrosis that was NOT stented. He also had a likely PE identified on CTA and was started on Eliquis. He has no insurance, difficulty paying bills, and obtaining transportation.    Today he tells me that he has been feeling generally well since leaving the hospital. "Getting better day-by-day." He does not elaborate much but denies any acute complaints. He assures me he has plans to attend oncology follow-up with Dr. Candise Che and is able to verbalize how he has been taking his Decadron (BID at first, now daily) which is in keeping with his discharge instructions.    PCP Follow-up Recommendations per DC Summary: Ensure outpatient dentistry established - no, but he has a meeting with his caseworker today. I have provided a note to hte caseworker asking them to assist in making this connection and arranging interpretation services and transport.  Ensure oncology follow up with Dr. Candise Che (9/30) - yes, he is aware and eager to hear Dr. Clyda Greener thoughts.  Continued GOC discussions, HCPOA, etc - he is hopeful Dr. Candise Che may shed more light on his prognosis and GOC. He again tells me he has "no one" to be his HCPOA. When pressed, he again affirms that there is "no one." Reassess need for continued anticoagulation after duration of therapy - tolerating Eliquis fine for now, given his large cancer burden and secondary hypercoaguable state, I think it is more than reasonable to keep this on board for a minimum 3 months unless Dr. Candise Che feels otherwise.Marland Kitchen  FMLA paperwork - he was unaware of what this was or how to obtain it, plans to discuss with his caseworker after today's visit.  Determine if patient is taking Decadron taper  appropriately - yes! He was able to verbalize this to me today.  Repeat BMP, CBC at follow up - ordered.  8. Consider renal ultrasound for L sided hydronephrosis, may need scheduled with Urology - defer to his visit with Dr. Manson Passey on 10/7. He denies any urinary symptoms at this time. 9. Consider a statin in the future (aortic atherosclerosis) - given that his active cancer currently poses the greatest risk to her health right now, I think it reasonable to wait and see what direction this is going to take  10. Continue to encourage complete tobacco cessation - he assures me he has not used any tobacco products in two years.      OBJECTIVE:   BP 113/75   Pulse 98   Wt 119 lb 12.8 oz (54.3 kg)   SpO2 100%   BMI 17.69 kg/m   Gen: Cachectic middle aged man, chronically ill appearing but NAD, speaks very softly. Neck without lymphadenopathy Cardio: Regular rate and rhythm Pulm: Normal WOB on RA, lungs clear throughout without wheezes, rales, or rhonchi Abd: Non-tender, non-distended and without organomegaly Ext: Without edema or deformity   ASSESSMENT/PLAN:   Prostate cancer metastatic to bone [facility]) Most of the next steps in his care will hinge on his discussion with Dr. Candise Che on 9/30. It is to be seen if/how his disease will respond to therapy. For now, he is stable from hospital discharge, has even gained a very modest amount of weight.  - Continue Decadron taper as prescribed - Continue Casodex daily -  Follow-up with Dr. Candise Che on 9/30 and Dr. Manson Passey on 10/7 - Caseworker to assist with getting him plugged in with dentistry before any potential bisphosphonate therapy - BMP and CBC today      J Dorothyann Gibbs, MD [facility] Medicine [facility]

## 2022-12-02 NOTE — Congregational Nurse Program (Signed)
  Dept: [phone]   Congregational Nurse Program Note  Date of Encounter: 12/02/2022  Past Medical History: Past Medical History:  Diagnosis Date   Back pain    Hypertension     Encounter Details:  Patient assisted with transportation to go to appointment. Patient will come to [facility] after appointment to meet with case managers for assistance with applications for SNAP,SSI and medicaid.  Nicole Cella Berthold Glace RN BSN PCCN  [facility] [phone]-cell [phone]-office

## 2022-12-03 LAB — BASIC METABOLIC PANEL
BUN/Creatinine Ratio: 18 (ref 10–24)
BUN: 12 mg/dL (ref 8–27)
CO2: 26 mmol/L (ref 20–29)
Calcium: 8.6 mg/dL (ref 8.6–10.2)
Chloride: 98 mmol/L (ref 96–106)
Creatinine, Ser: 0.67 mg/dL — ABNORMAL LOW (ref 0.76–1.27)
Glucose: 77 mg/dL (ref 70–99)
Potassium: 5.3 mmol/L — ABNORMAL HIGH (ref 3.5–5.2)
Sodium: 137 mmol/L (ref 134–144)
eGFR: 104 mL/min/{1.73_m2} (ref >59)

## 2022-12-03 LAB — CBC WITH DIFFERENTIAL/PLATELET
Basophils Absolute: 0.1 10*3/uL (ref 0.0–0.2)
Basos: 1 %
EOS (ABSOLUTE): 0.1 10*3/uL (ref 0.0–0.4)
Eos: 1 %
Hematocrit: 35.4 % — ABNORMAL LOW (ref 37.5–51.0)
Hemoglobin: 11.4 g/dL — ABNORMAL LOW (ref 13.0–17.7)
Immature Grans (Abs): 0.1 10*3/uL (ref 0.0–0.1)
Immature Granulocytes: 1 %
Lymphocytes Absolute: 4 10*3/uL — ABNORMAL HIGH (ref 0.7–3.1)
Lymphs: 49 %
MCH: 27.7 pg (ref 26.6–33.0)
MCHC: 32.2 g/dL (ref 31.5–35.7)
MCV: 86 fL (ref 79–97)
Monocytes Absolute: 0.6 10*3/uL (ref 0.1–0.9)
Monocytes: 7 %
NRBC: 1 % — ABNORMAL HIGH (ref 0–0)
Neutrophils Absolute: 3.3 10*3/uL (ref 1.4–7.0)
Neutrophils: 41 %
Platelets: 139 10*3/uL — ABNORMAL LOW (ref 150–450)
RBC: 4.12 x10E6/uL — ABNORMAL LOW (ref 4.14–5.80)
RDW: 14.1 % (ref 11.6–15.4)
WBC: 8.2 10*3/uL (ref 3.4–10.8)

## 2022-12-04 NOTE — Assessment & Plan Note (Addendum)
Most of the next steps in his care will hinge on his discussion with Dr. Candise Che on 9/30. It is to be seen if/how his disease will respond to therapy. For now, he is stable from hospital discharge, has even gained a very modest amount of weight.  - Continue Decadron taper as prescribed - Continue Casodex daily - Follow-up with Dr. Candise Che on 9/30 and Dr. Manson Passey on 10/7 - Caseworker to assist with getting him plugged in with dentistry before any potential bisphosphonate therapy - BMP and CBC today

## 2022-12-06 NOTE — Congregational Nurse Program (Signed)
  Dept: [phone]   Congregational Nurse Program Note  Date of Encounter: 12/06/2022  Past Medical History: Past Medical History:  Diagnosis Date   Back pain    Hypertension     Encounter Details:  CNP Questionnaire - 12/06/22 1916       Questionnaire   Ask client: Do you give verbal consent for me to treat you today? 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    Insurance/Financial Assistance Referral N/A    Medication Have Medication Insecurities    Medical Provider Yes    Screening Referrals Made N/A    Medical Referrals Made N/A    Medical Appointment Made N/A    Recently w/o PCP, now 1st time PCP visit completed due to CNs referral or appointment made Yes    Food Have Food Insecurities    Transportation Need transportation assistance;Provided transportation assistance    Housing/Utilities Unable to pay for utilities;Referred to housing/utility assistance program    Interpersonal Safety N/A    Interventions Advocate/Support;Navigate Healthcare System;Case Management;Counsel;Educate;Reviewed Medications    Abnormal to Normal Screening Since Last CN Visit N/A    Screenings CN Performed N/A    Sent Client to Lab for: N/A    Did client attend any of the following based off CNs referral or appointments made? N/A    ED Visit Averted Yes    Life-Saving Intervention Made N/A            patient contacted with appointment reminder for tomorrow 12/07/23 at the [facility] Transportation will be provided.  Nicole Cella Shaneece Stockburger RN BSN PCCN  [facility] [phone]-cell [phone]-office

## 2022-12-07 ENCOUNTER — Other Ambulatory Visit: Payer: Self-pay

## 2022-12-07 ENCOUNTER — Telehealth: Payer: Self-pay

## 2022-12-07 ENCOUNTER — Telehealth: Payer: Self-pay | Admitting: Pharmacy Technician

## 2022-12-07 ENCOUNTER — Inpatient Hospital Stay: Payer: BC Managed Care – PPO

## 2022-12-07 ENCOUNTER — Encounter: Payer: Self-pay | Admitting: Hematology

## 2022-12-07 ENCOUNTER — Other Ambulatory Visit (HOSPITAL_COMMUNITY): Payer: Self-pay

## 2022-12-07 ENCOUNTER — Inpatient Hospital Stay: Payer: BC Managed Care – PPO | Attending: Hematology | Admitting: Hematology

## 2022-12-07 VITALS — BP 140/84 | HR 100 | Temp 98.1°F | Resp 18 | Wt 126.1 lb

## 2022-12-07 DIAGNOSIS — C61 Malignant neoplasm of prostate: Secondary | ICD-10-CM | POA: Diagnosis present

## 2022-12-07 DIAGNOSIS — Z7901 Long term (current) use of anticoagulants: Secondary | ICD-10-CM | POA: Insufficient documentation

## 2022-12-07 DIAGNOSIS — E43 Unspecified severe protein-calorie malnutrition: Secondary | ICD-10-CM | POA: Insufficient documentation

## 2022-12-07 DIAGNOSIS — D649 Anemia, unspecified: Secondary | ICD-10-CM | POA: Diagnosis not present

## 2022-12-07 DIAGNOSIS — R918 Other nonspecific abnormal finding of lung field: Secondary | ICD-10-CM | POA: Insufficient documentation

## 2022-12-07 DIAGNOSIS — Z87891 Personal history of nicotine dependence: Secondary | ICD-10-CM | POA: Diagnosis not present

## 2022-12-07 DIAGNOSIS — D72820 Lymphocytosis (symptomatic): Secondary | ICD-10-CM | POA: Diagnosis not present

## 2022-12-07 DIAGNOSIS — C7951 Secondary malignant neoplasm of bone: Secondary | ICD-10-CM | POA: Diagnosis not present

## 2022-12-07 DIAGNOSIS — G893 Neoplasm related pain (acute) (chronic): Secondary | ICD-10-CM | POA: Insufficient documentation

## 2022-12-07 DIAGNOSIS — Z7189 Other specified counseling: Secondary | ICD-10-CM | POA: Diagnosis not present

## 2022-12-07 MED ORDER — ENZALUTAMIDE 80 MG PO TABS: 160.0000 mg | ORAL_TABLET | Freq: Every day | ORAL | 3 refills | Status: DC

## 2022-12-07 MED ORDER — OXYCODONE HCL 5 MG PO TABS
5.0000 mg | ORAL_TABLET | Freq: Four times a day (QID) | ORAL | 0 refills | Status: DC | PRN
Start: 2022-12-07 — End: 2022-12-15
  Filled 2022-12-07: qty 30, 8d supply, fill #0

## 2022-12-07 MED ORDER — VITAMIN D (ERGOCALCIFEROL) 1.25 MG (50000 UNIT) PO CAPS
50000.0000 [IU] | ORAL_CAPSULE | ORAL | 0 refills | Status: DC
  Filled 2022-12-07: qty 12, 84d supply, fill #0

## 2022-12-07 MED ORDER — ACETAMINOPHEN 500 MG PO TABS
1000.0000 mg | ORAL_TABLET | Freq: Three times a day (TID) | ORAL | 0 refills | Status: DC | PRN
  Filled 2022-12-07: qty 100, 17d supply, fill #0

## 2022-12-07 MED ORDER — APIXABAN 5 MG PO TABS
5.0000 mg | ORAL_TABLET | Freq: Two times a day (BID) | ORAL | 4 refills | Status: DC
  Filled 2022-12-07: qty 60, 30d supply, fill #0

## 2022-12-07 MED ORDER — POLYETHYLENE GLYCOL 3350 17 G PO PACK: 34.0000 g | PACK | Freq: Every day | ORAL | Status: DC

## 2022-12-07 NOTE — Telephone Encounter (Signed)
Oral Oncology Pharmacist Encounter  Received new prescription for Xtandi (enzalutamide) for the treatment of newly diagnosed metastatic prostate cancer in conjunction with Eligard and zometa, planned duration until disease progression or unacceptable toxicity.  Labs from 12/02/22 (CBC and BMP) assessed, no interventions needed. Prescription dose and frequency assessed for appropriateness.   Current medication list in Epic reviewed, DDIs with Xtandi identified: - eliquis (cat D): xtandi can decrease the efficacy/ concentration of eliquis. Messaged MD to discuss interaction and per MD it was a questionable PE and he is not concerned about the interaction and would like to continue with xtandi at this time.  - oxycodone (cat C): xtandi may decrease the concentration of oxycodone and it may decrease how well the medication works.   Evaluated chart and no patient barriers to medication adherence noted.   Patient agreement for treatment documented in MD note on 12/07/2022.  Patient is to start calcium rich diet and dr. Candise Che to send in high dose vitamin D for patient to begin.   Prescription has been e-scribed to the [facility] for benefits analysis and approval.  Oral Oncology Clinic will continue to follow for insurance authorization, copayment issues, initial counseling and start date.  Bethel Born, PharmD Hematology/Oncology Clinical Pharmacist [facility] Oral Chemotherapy Navigation Clinic [phone] 12/07/2022 1:33 PM

## 2022-12-07 NOTE — Progress Notes (Signed)
Introduced myself to the patient as the prostate nurse navigator.  Patient could benefit from transportation for future appointments, will place request for this.  Social work referral has also been initiated.   He is here to discuss his treatment options.  I gave him my business card and asked him to call me with questions or concerns and also explained to him that I would reach out to his congregational nurse, Nicole Cella, to provide plan of care and my contact information.  Verbalized understanding.

## 2022-12-07 NOTE — Telephone Encounter (Signed)
Oral Oncology Patient Advocate Encounter   Received notification that prior authorization for [name] is required.   PA submitted on 12/07/22 Key [id number] Status is pending     Jinger Neighbors, CPhT-Adv Oncology Pharmacy Patient Advocate [facility] Direct Number: [phone]  Fax: [phone]

## 2022-12-07 NOTE — Progress Notes (Addendum)
HEMATOLOGY/ONCOLOGY CLINIC NOTE  Date of Service: 12/07/2022  Patient Care Team: [name], MD as PCP - General (Family Medicine)  CHIEF COMPLAINTS/PURPOSE OF CONSULTATION:  Evaluation and management of newly metastatic prostate cancer  HISTORY OF PRESENTING ILLNESS:  Arthur Ali is a wonderful 62 y.o. male who is being seen for evaluation and management of likely metastatic prostate cancer based on elevated PSA levels.     Patient was previously seen by me in 2021 for lymphocytosis thought to be a reactive process. At that time, he had no signs of monoclonal lymphocytes. He was also vitamin B12 deficient at that time and his pseudothrombocytopenia had resolved.    Patient presented to the hospital with worsening dyspnea chest pain night sweats loss of appetite and unintentional weight loss and will directly admitted from the family medicine clinic.  CT of the chest done on 11/19/2022 showed no evidence of pulmonary embolism but did show pathologic thoracic adenopathy within the right paratracheal and subcarinal lymph node groups as well as widespread sclerotic metastatic disease throughout the visualized axial skeleton.  He was noted to have a 6 mm noncalcified pulmonary nodule in the right upper lobe.  Also noted to have mild central pulmonary artery enlargement with suggestions of elevated right heart pressure. He also had a x-ray of the lumbar spine which showed degenerative change of the lumbar spine and diffuse mottled appearance of the femoral heads and pelvis which could be concerning for metastatic disease.   Patient had a PSA test which showed significant elevation to nearly 1500 being highly suggestive of metastatic prostate cancer. TB QuantiFERON test is currently pending.   Patient CBC today shows normal WBC count of 10.2k with anemia with a hemoglobin of 10.5 and a platelet count of 174k. CMP shows hypocalcemia with calcium of 7.9, decreased albumin of 1.8 and  alkaline phosphatase of 500. Normal transaminases and bilirubin.  Creatinine is within normal limits at 0.76.   HIV test is nonreactive TSH was within normal limits at 1.85 Multiple myeloma panel and K/L FLC are currently pending.   Patient was seen in airborne isolation with the help of video [place] interpreter Arthur Ali).  He notes that he has lost 10 to 15 kg of body weight with significantly decreased p.o. intake.  Notes decreased urinary flow about a week ago but is a little better now.  No overt hematuria.  Notes some issues with constipation. Has had diffuse bodyaches especially over the spine and pelvis.   Also notes some shortness of breath. Chest wall pain noted. Notes no new upper or lower extremity weakness, no loss of bowel or bladder control.  INTERVAL HISTORY:  Arthur Ali is a wonderful 62 y.o. male who is here for continue evaluation and management of metastatic prostate cancer. Patient was last seen by me as in-patient on 11/23/2022. Patient is present today with an Equities trader.   Patient notes he has been doing well overall since our last visit. He notes that her bone pain and hip pain has improved since he got discharged from the hospital. Patient notes his appetite has also improved, he tries to eat three times a day. He notes he has gained weight since last visit. His breathing has also improved.   Patient notes he was working at [employer] before he was hospitalized.   Patient is currently taking pain medication three times a day for his pain. He is unsure of the pain medication name, but it is the same medication that  was prescribed at the time of discharge.  He currently lives by himself. He notes that his friend lives in [place] and his friend helps him.   He denies any new infection issues, fever, chills, night sweats, unexpected weight loss, abnormal bowel movement, chest pain, back pain, abdominal pain, or leg swelling.  MEDICAL HISTORY:   Past Medical History:  Diagnosis Date   Back pain    Hypertension     SURGICAL HISTORY: No past surgical history on file.  SOCIAL HISTORY: Social History   Socioeconomic History   Marital status: Married    Spouse name: Not on file   Number of children: Not on file   Years of education: Not on file   Highest education level: Not on file  Occupational History   Not on file  Tobacco Use   Smoking status: Former    Types: Cigars, Cigarettes   Smokeless tobacco: Never   Tobacco comments:    Quit in 2022.   Vaping Use   Vaping status: Never Used  Substance and Sexual Activity   Alcohol use: Never   Drug use: Never   Sexual activity: Not Currently  Other Topics Concern   Not on file  Social History Narrative   Single applicant from [place]   No family here or in [place]      Refugee Information   Number of Immediate Family Members: 0   Number of Immediate Family Members in [place]: 0   Country of Birth: [facility]   Country of Origin: [facility]   Location of Refugee Camp: [place]   Duration in [place]: 20 years or greater   Reason for Leaving Home Country: Political opinion   Primary Language: Swahili/Kiswahili, Kinyarwanda/[place]   Able to Read in Primary Language: Yes   Able to Write in Primary Language: Yes   Education: Primary School   Prior Work: No previous jobs   Marital Status: Other (Wife and children decreased)   Sexual Activity: No   Tuberculosis Screening Overseas: Positive   Tuberculosis Screening Health Department: Not Completed   Health Department Labs Completed: Yes   History of Trauma: None   Do You Feel Jumpy or Nervous?: No   Are You Very Watchful or 'Super Alert'?: No   Social Determinants of Health   Financial Resource Strain: Not on file  Food Insecurity: No Food Insecurity (11/19/2022)   Hunger Vital Sign    Worried About Running Out of Food in the Last Year: Never true    Ran Out of Food in the Last Year: Never true  Transportation Needs: No Transportation  Needs (11/19/2022)   PRAPARE - Administrator, Civil Service (Medical): No    Lack of Transportation (Non-Medical): No  Physical Activity: Not on file  Stress: Not on file  Social Connections: Not on file  Intimate Partner Violence: Not At Risk (11/19/2022)   Humiliation, Afraid, Rape, and Kick questionnaire    Fear of Current or Ex-Partner: No    Emotionally Abused: No    Physically Abused: No    Sexually Abused: No    FAMILY HISTORY: Family History  Family history unknown: Yes    ALLERGIES:  has No Known Allergies.  MEDICATIONS:  Current Outpatient Medications  Medication Sig Dispense Refill   acetaminophen (TYLENOL) 500 MG tablet Take 2 tablets (1,000 mg total) by mouth every 8 (eight) hours for 5 days then as directed by MD 100 tablet 0   APIXABAN (ELIQUIS) VTE STARTER PACK (10MG  AND 5MG ) Take as  directed on package: start with two-5mg  tablets twice daily for 7 days. On day 8, switch to one-5mg  tablet twice daily. 74 each 0   bicalutamide (CASODEX) 50 MG tablet Take 1 tablet (50 mg total) by mouth daily. 12 tablet 0   dexamethasone (DECADRON) 2 MG tablet Take 2 tablets (4 mg total) by mouth every 12 (twelve) hours for 3 days, THEN 2 tablets (4 mg total) daily for 7 days, THEN 1 tablet (2 mg total) daily for 7 days. 33 tablet 0   feeding supplement (ENSURE ENLIVE / ENSURE PLUS) LIQD Take 237 mLs by mouth 3 (three) times daily between meals.     oxyCODONE (OXY IR/ROXICODONE) 5 MG immediate release tablet Take 1 tablet (5 mg total) by mouth every 4 (four) hours as needed for severe pain. 21 tablet 0   polyethylene glycol (MIRALAX / GLYCOLAX) 17 g packet Take 34 g by mouth 2 (two) times daily.     Vitamin D, Ergocalciferol, (DRISDOL) 1.25 MG (50000 UNIT) CAPS capsule Take 1 capsule (50,000 Units total) by mouth every 7 (seven) days. 10 capsule 0   No current facility-administered medications for this visit.    REVIEW OF SYSTEMS:    10 Point review of Systems was done  is negative except as noted above.  PHYSICAL EXAMINATION: ECOG PERFORMANCE STATUS: 2 - Symptomatic, <50% confined to bed  . Vitals:   12/07/22 1105  BP: (!) 140/84  Pulse: 100  Resp: 18  Temp: 98.1 F (36.7 C)  SpO2: 99%   Filed Weights   12/07/22 1105  Weight: 126 lb 1.6 oz (57.2 kg)   .Body mass index is 18.62 kg/m.  GENERAL:alert, in no acute distress and comfortable SKIN: no acute rashes, no significant lesions EYES: conjunctiva are pink and non-injected, sclera anicteric OROPHARYNX: MMM, no exudates, no oropharyngeal erythema or ulceration NECK: supple, no JVD LYMPH:  no palpable lymphadenopathy in the cervical, axillary or inguinal regions LUNGS: clear to auscultation b/l with normal respiratory effort HEART: regular rate & rhythm ABDOMEN:  normoactive bowel sounds , non tender, not distended. Extremity: no pedal edema PSYCH: alert & oriented x 3 with fluent speech NEURO: no focal motor/sensory deficits  LABORATORY DATA:  I have reviewed the data as listed  .    Latest Ref Rng & Units 12/02/2022   12:33 PM 11/26/2022    3:56 AM 11/25/2022    6:38 AM  CBC  WBC 3.4 - 10.8 x10E3/uL 8.2  9.7  10.3   Hemoglobin 13.0 - 17.7 g/dL 81.1  91.4  78.2   Hematocrit 37.5 - 51.0 % 35.4  35.3  36.3   Platelets 150 - 450 x10E3/uL 139  263  251     .    Latest Ref Rng & Units 12/02/2022   12:33 PM 11/25/2022    6:38 AM 11/24/2022    5:55 AM  CMP  Glucose 70 - 99 mg/dL 77  956  96   BUN 8 - 27 mg/dL 12  20  18    Creatinine 0.76 - 1.27 mg/dL 2.13  0.86  5.78   Sodium 134 - 144 mmol/L 137  131  131   Potassium 3.5 - 5.2 mmol/L 5.3  4.5  4.9   Chloride 96 - 106 mmol/L 98  99  98   CO2 20 - 29 mmol/L 26  26  26    Calcium 8.6 - 10.2 mg/dL 8.6  8.2  8.2    Component     Latest Ref Rng 11/20/2022  11/21/2022  Prostatic Specific Antigen     0.00 - 4.00 ng/mL 1,444.22 (H)    Vitamin D, 25-Hydroxy     30 - 100 ng/mL  33.22     Legend: (H) High  SURGICAL PATHOLOGY  CASE:  [id number]  PATIENT: Angela Cox  Surgical Pathology Report      Clinical History: sclerotic changes/infiltration, anticipated prostate  mets (cm)      FINAL MICROSCOPIC DIAGNOSIS:   A. BONE, RIGHT POSTERIOR ILIAC, BIOPSY:  -  Metastatic carcinoma, consistent with prostate origin.   Note: The biopsy of bone with extensive bone marrow fibrosis/sclerosis.  Embedded in the sclerotic stroma are extremely crushed cells without  identifiable morphology; however, the clinical impression of metastatic  prostate carcinoma is noted and confirmatory immunohistochemical stains  highlight these crushed cells of interest as positive for NKX3.1  consistent with metastatic carcinoma of prostate primary.  Dr. Penalosa Callas is  peer-reviewed the case and agrees with the interpretation.    RADIOGRAPHIC STUDIES: I have personally reviewed the radiological images as listed and agreed with the findings in the report. NM Bone Scan Whole Body  Result Date: 11/25/2022 CLINICAL DATA:  Prostate cancer, high risk, staging EXAM: NUCLEAR MEDICINE WHOLE BODY BONE SCAN TECHNIQUE: Whole body anterior and posterior images were obtained approximately 3 hours after intravenous injection of radiopharmaceutical. RADIOPHARMACEUTICALS:  21 mCi Technetium-53m MDP IV COMPARISON:  November 19, 2022, November 24, 2022 FINDINGS: Focal radiotracer within the LEFT renal collecting system consistent with hydronephrosis. Mottled increased radiotracer uptake throughout the calvarium, bilateral ribs, sternum, pelvis, bilateral proximal humeri, and bilateral proximal femurs are consistent with osseous metastatic disease. No significant uptake is identified within the kidneys, consistent with diffuse osseous metastatic disease. IMPRESSION: 1. Diffuse osseous metastatic disease throughout the appendicular and axial skeleton. Osseous metastatic disease involves the proximal femurs bilaterally. 2. LEFT-sided hydronephrosis.  Electronically Signed   By: Meda Klinefelter M.D.   On: 11/25/2022 16:49   CT BIOPSY  Result Date: 11/25/2022 INDICATION: 61 year old male with a history of prostate carcinoma referred for biopsy of bone lesion EXAM: CT BIOPSY MEDICATIONS: None. ANESTHESIA/SEDATION: Moderate (conscious) sedation was employed during this procedure. A total of Versed 1.5 mg and Fentanyl 75 mcg was administered intravenously. Moderate Sedation Time: 14 minutes. The patient's level of consciousness and vital signs were monitored continuously by radiology nursing throughout the procedure under my direct supervision. FLUOROSCOPY TIME:  CT COMPLICATIONS: None PROCEDURE: Informed written consent was obtained from the patient after a thorough discussion of the procedural risks, benefits and alternatives. All questions were addressed. Maximal Sterile Barrier Technique was utilized including caps, mask, sterile gowns, sterile gloves, sterile drape, hand hygiene and skin antiseptic. A timeout was performed prior to the initiation of the procedure. The procedure risks, benefits, and alternatives were explained to the patient with interpreter. Questions regarding the procedure were encouraged and answered. The patient understands and consents to the procedure. Scout CT of the pelvis was performed for surgical planning purposes. The posterior pelvis was prepped with Chlorhexidine in a sterile fashion, and a sterile drape was applied covering the operative field. A sterile gown and sterile gloves were used for the procedure. Local anesthesia was provided with 1% Lidocaine. Posterior right iliac bone was targeted for biopsy, targeting the diffusely sclerotic infiltration, presumably prostate metastases. The skin and subcutaneous tissues were infiltrated with 1% lidocaine without epinephrine. A small stab incision was made with an 11 blade scalpel, and guide needle from OnControl device was advanced with CT guidance to the posterior  cortex.  Manual forced was used to advance the needle through the posterior cortex and the stylet was removed. The needle was then advanced without the stylet for a core biopsy using the OnControl device. . Manual pressure was used for hemostasis and a sterile dressing was placed. No complications were encountered no significant blood loss was encountered. Patient tolerated the procedure well and remained hemodynamically stable throughout. IMPRESSION: Status post CT-guided biopsy of sclerotic bone lesion of the posterior right iliac bone. Signed, Yvone Neu. Miachel Roux, RPVI [facility] Electronically Signed   By: Gilmer Mor D.O.   On: 11/25/2022 12:27   CT Angio Chest Pulmonary Embolism (PE) W or WO Contrast  Addendum Date: 11/24/2022   ADDENDUM REPORT: 11/24/2022 14:22 ADDENDUM: Critical Value/emergent results were called by telephone at the time of interpretation on 11/24/2022 at 2:21 pm to provider Charolotte Capuchin, who verbally acknowledged these results. Electronically Signed   By: Lupita Raider M.D.   On: 11/24/2022 14:22   Result Date: 11/24/2022 CLINICAL DATA:  High suspicion of pulmonary embolism. EXAM: CT ANGIOGRAPHY CHEST WITH CONTRAST TECHNIQUE: Multidetector CT imaging of the chest was performed using the standard protocol during bolus administration of intravenous contrast. Multiplanar CT image reconstructions and MIPs were obtained to evaluate the vascular anatomy. RADIATION DOSE REDUCTION: This exam was performed according to the departmental dose-optimization program which includes automated exposure control, adjustment of the mA and/or kV according to patient size and/or use of iterative reconstruction technique. CONTRAST:  75mL OMNIPAQUE IOHEXOL 350 MG/ML SOLN COMPARISON:  November 19, 2022. FINDINGS: Cardiovascular: Linear density is noted in a lower lobe branch of the right pulmonary artery; it is uncertain if this represents small  pulmonary embolus or potentially artifact from adjacent bronchial disease. Normal cardiac size. No pericardial effusion. Mediastinum/Nodes: Thyroid gland is unremarkable. There is again noted mediastinal adenopathy concerning for metastatic disease or other malignancy. This includes 2.6 cm precarinal lymph node. 17 mm right paratracheal lymph node is noted. Lungs/Pleura: No pneumothorax or pleural effusion is noted. Emphysematous disease is noted bilaterally. Mild bibasilar subsegmental atelectasis or scarring is noted. There does appear to be probable aspirated material or mucous plugging seen in right lower lobe bronchi with bronchial wall thickening suggesting inflammation. 3 mm nodule is noted in right lung apex best seen on image number 31 of series 7. Upper Abdomen: Probable retroperitoneal adenopathy is noted, although not well visualized due to limited contrast opacification. Musculoskeletal: Stable diffuse sclerotic metastatic lesions are noted. Review of the MIP images confirms the above findings. IMPRESSION: Linear density is noted in a lower lobe branch of the right pulmonary artery. It is uncertain if this represents pulmonary embolus or potentially artifact from adjacent bronchial wall thickening and inflammation and probable aspirated material. There is again noted mediastinal adenopathy and probable retroperitoneal adenopathy consistent with metastatic disease or malignancy. As noted above, probable aspirated material or mucous plugging is noted in right lower lobe bronchi with associated wall thickening suggesting some degree of associated bronchitis. 3 mm nodule noted in right lung apex. Given the history of malignancy, metastatic disease cannot be excluded. Stable diffuse osseous metastatic disease is noted. Aortic Atherosclerosis (ICD10-I70.0) and Emphysema (ICD10-J43.9). Electronically Signed: By: Lupita Raider M.D. On: 11/24/2022 14:08   ECHOCARDIOGRAM COMPLETE  Result Date: 11/21/2022     ECHOCARDIOGRAM REPORT   Patient Name:   KHRYSTOPHER MCALPINE Date of Exam: 11/21/2022 Medical Rec #:  [id number]  Height:       69.0 in Accession #:    [id number]          Weight:       112.8 lb Date of Birth:  1961/01/15            BSA:          1.620 m Patient Age:    62 years            BP:           127/84 mmHg Patient Gender: M                   HR:           82 bpm. Exam Location:  Inpatient Procedure: 2D Echo, Color Doppler and Cardiac Doppler Indications:    Cardiomegaly  History:        Patient has no prior history of Echocardiogram examinations.                 Cardiomegaly; Prostate Cancer.  Sonographer:    Milbert Coulter Referring Phys: [id number] CARINA M BROWN  Sonographer Comments: Image acquisition challenging due to patient body habitus. IMPRESSIONS  1. Left ventricular ejection fraction, by estimation, is 60 to 65%. The left ventricle has normal function. The left ventricle has no regional wall motion abnormalities. Left ventricular diastolic parameters are consistent with Grade I diastolic dysfunction (impaired relaxation).  2. Right ventricular systolic function is normal. The right ventricular size is normal. Tricuspid regurgitation signal is inadequate for assessing PA pressure.  3. The mitral valve is normal in structure. No evidence of mitral valve regurgitation. No evidence of mitral stenosis.  4. The aortic valve is tricuspid. There is mild calcification of the aortic valve. There is mild thickening of the aortic valve. Aortic valve regurgitation is not visualized. No aortic stenosis is present.  5. The inferior vena cava is normal in size with greater than 50% respiratory variability, suggesting right atrial pressure of 3 mmHg. FINDINGS  Left Ventricle: Left ventricular ejection fraction, by estimation, is 60 to 65%. The left ventricle has normal function. The left ventricle has no regional wall motion abnormalities. The left ventricular internal cavity size was normal in size. There is   no left ventricular hypertrophy. Left ventricular diastolic parameters are consistent with Grade I diastolic dysfunction (impaired relaxation). Normal left ventricular filling pressure. Right Ventricle: The right ventricular size is normal. Right vetricular wall thickness was not well visualized. Right ventricular systolic function is normal. Tricuspid regurgitation signal is inadequate for assessing PA pressure. Left Atrium: Left atrial size was normal in size. Right Atrium: Right atrial size was normal in size. Pericardium: There is no evidence of pericardial effusion. Mitral Valve: The mitral valve is normal in structure. No evidence of mitral valve regurgitation. No evidence of mitral valve stenosis. Tricuspid Valve: The tricuspid valve is normal in structure. Tricuspid valve regurgitation is not demonstrated. No evidence of tricuspid stenosis. Aortic Valve: The aortic valve is tricuspid. There is mild calcification of the aortic valve. There is mild thickening of the aortic valve. There is mild aortic valve annular calcification. Aortic valve regurgitation is not visualized. No aortic stenosis  is present. Aortic valve mean gradient measures 3.0 mmHg. Aortic valve peak gradient measures 4.6 mmHg. Aortic valve area, by VTI measures 3.56 cm. Pulmonic Valve: The pulmonic valve was not well visualized. Pulmonic valve regurgitation is not visualized. No evidence of pulmonic stenosis. Aorta: The aortic root is normal in size and  structure. Venous: The inferior vena cava is normal in size with greater than 50% respiratory variability, suggesting right atrial pressure of 3 mmHg. IAS/Shunts: No atrial level shunt detected by color flow Doppler.  LEFT VENTRICLE PLAX 2D LVIDd:         4.10 cm   Diastology LVIDs:         2.60 cm   LV e' medial:    10.10 cm/s LV PW:         1.00 cm   LV E/e' medial:  5.4 LV IVS:        1.00 cm   LV e' lateral:   13.10 cm/s LVOT diam:     2.30 cm   LV E/e' lateral: 4.2 LV SV:         70 LV  SV Index:   43 LVOT Area:     4.15 cm  RIGHT VENTRICLE RV Basal diam:  3.30 cm RV Mid diam:    2.10 cm RV S prime:     19.10 cm/s TAPSE (M-mode): 2.4 cm LEFT ATRIUM           Index        RIGHT ATRIUM           Index LA diam:      2.80 cm 1.73 cm/m   RA Area:     13.10 cm LA Vol (A2C): 41.7 ml 25.75 ml/m  RA Volume:   34.70 ml  21.42 ml/m LA Vol (A4C): 19.5 ml 12.04 ml/m  AORTIC VALVE AV Area (Vmax):    3.57 cm AV Area (Vmean):   3.40 cm AV Area (VTI):     3.56 cm AV Vmax:           107.00 cm/s AV Vmean:          73.300 cm/s AV VTI:            0.197 m AV Peak Grad:      4.6 mmHg AV Mean Grad:      3.0 mmHg LVOT Vmax:         91.90 cm/s LVOT Vmean:        59.900 cm/s LVOT VTI:          0.169 m LVOT/AV VTI ratio: 0.86  AORTA Ao Root diam: 3.10 cm MITRAL VALVE MV Area (PHT): 3.60 cm    SHUNTS MV Decel Time: 211 msec    Systemic VTI:  0.17 m MV E velocity: 54.90 cm/s  Systemic Diam: 2.30 cm MV A velocity: 87.30 cm/s MV E/A ratio:  0.63 Dina Rich MD Electronically signed by Dina Rich MD Signature Date/Time: 11/21/2022/4:10:11 PM    Final    CT ABDOMEN PELVIS W CONTRAST  Result Date: 11/21/2022 CLINICAL DATA:  Prostate cancer, high risk, staging EXAM: CT ABDOMEN AND PELVIS WITH CONTRAST TECHNIQUE: Multidetector CT imaging of the abdomen and pelvis was performed using the standard protocol following bolus administration of intravenous contrast. RADIATION DOSE REDUCTION: This exam was performed according to the departmental dose-optimization program which includes automated exposure control, adjustment of the mA and/or kV according to patient size and/or use of iterative reconstruction technique. CONTRAST:  75mL OMNIPAQUE IOHEXOL 350 MG/ML SOLN COMPARISON:  11/19/2022 FINDINGS: Lower chest: Scattered bibasilar hypoventilatory changes. Hepatobiliary: No focal liver abnormality is seen. No gallstones, gallbladder wall thickening, or biliary dilatation. Pancreas: Unremarkable. No pancreatic ductal  dilatation or surrounding inflammatory changes. Spleen: Normal in size without focal abnormality. Adrenals/Urinary Tract: The right kidney is unremarkable. The left kidney  demonstrates moderate cortical atrophy, with mild left hydronephrosis and hydroureter. Delayed excretion of contrast from the left kidney on delayed imaging. The adrenals are unremarkable. The bladder is moderately distended, without filling defects. Stomach/Bowel: Evaluation of the bowel is limited due to marked cachexia and the lack of oral contrast. No evidence of bowel obstruction or ileus. Stomach is moderately distended. Vascular/Lymphatic: Aortic atherosclerosis. Marked cachexia and the lack of intraperitoneal and retroperitoneal fat limits evaluation for lymphadenopathy. Right external iliac chain lymph node image 62/3 measures 10 mm in short axis. Possible left para-aortic adenopathy reference image 39/3 measuring 10 mm in short axis. Reproductive: Prostate is enlarged and heterogeneous, measuring 5.9 x 5.5 x 5.7 cm. Other: Patient is markedly cachectic. No free fluid or free intraperitoneal gas. No abdominal wall hernia. Musculoskeletal: Mottled sclerosis is seen throughout all visualized bony structures involving the thoracic cage, spine, and pelvis, consistent with diffuse metastatic disease. No evidence of acute fracture. Reconstructed images demonstrate no additional findings. IMPRESSION: 1. Diffuse mottled sclerosis throughout the visualized bony structures, compatible with diffuse bony metastatic disease in this patient with a history of prostate cancer. 2. Left renal cortical atrophy, with mild left-sided obstructive uropathy. The exact source of obstruction is not well visualized, but could be due to an enlarged prostate causing mass effect at the left UVJ. 3. Borderline enlarged retroperitoneal and pelvic adenopathy as above, metastatic disease not excluded. 4. Heterogeneous enlarged prostate. 5.  Aortic Atherosclerosis  (ICD10-I70.0). 6. Marked cachexia. Electronically Signed   By: Sharlet Salina M.D.   On: 11/21/2022 14:23   DG Lumbar Spine 2-3 Views  Result Date: 11/19/2022 CLINICAL DATA:  Pain EXAM: LUMBAR SPINE - 2-3 VIEW COMPARISON:  None Available. FINDINGS: There is no evidence of lumbar spine fracture. Alignment is normal. Intervertebral disc spaces are maintained. Mild degenerative endplate osteophytes are seen throughout the lumbar spine. There is diffuse mottled appearance of femoral heads and pelvis. IMPRESSION: 1. Mild degenerative changes of the lumbar spine. 2. Diffuse mottled appearance of the femoral heads and pelvis, which may be due to osteopenia, metastatic disease, or metabolic bone disease. Electronically Signed   By: Darliss Cheney M.D.   On: 11/19/2022 23:01   CT Angio Chest Pulmonary Embolism (PE) W or WO Contrast  Result Date: 11/19/2022 CLINICAL DATA:  High probability pulmonary embolism EXAM: CT ANGIOGRAPHY CHEST WITH CONTRAST TECHNIQUE: Multidetector CT imaging of the chest was performed using the standard protocol during bolus administration of intravenous contrast. Multiplanar CT image reconstructions and MIPs were obtained to evaluate the vascular anatomy. RADIATION DOSE REDUCTION: This exam was performed according to the departmental dose-optimization program which includes automated exposure control, adjustment of the mA and/or kV according to patient size and/or use of iterative reconstruction technique. CONTRAST:  60mL OMNIPAQUE IOHEXOL 350 MG/ML SOLN COMPARISON:  None Available. FINDINGS: Cardiovascular: There is adequate opacification of the pulmonary arterial tree. No intraluminal filling defect identified to suggest acute pulmonary embolism. Mild central pulmonary arterial enlargement in keeping with changes of pulmonary arterial hypertension. Global cardiac size is within normal limits. There is mild relative enlargement of the right ventricle and shift of the intraventricular septum  to the left suggesting elevated right heart pressure. No pericardial effusion. Minimal atherosclerotic calcification within the thoracic aorta. No aortic aneurysm. Mediastinum/Nodes: There is pathologic thoracic adenopathy within the right paratracheal and subcarinal lymph node groups with the index lymph node measuring 4.6 x 2.5 cm at axial image # 42/7 within the right paratracheal lymph node group. Visualized thyroid is unremarkable. The  esophagus is unremarkable. Lungs/Pleura: Mild emphysema. 6 mm noncalcified subpleural pulmonary nodule noted within the right upper lobe, axial image # 27/9. 6 mm noncalcified pulmonary nodule noted within the right apex at axial image # 21/9. No confluent pulmonary infiltrates. No pneumothorax or pleural effusion. Upper Abdomen: There is suspected retrocrural and perirenal adenopathy within the upper abdomen, not well delineated given the lack of intra-abdominal fat and phase of contrast enhancement. No acute adenopathy. Musculoskeletal: Widespread sclerotic metastatic disease throughout the visualized axial skeleton no associated pathologic fracture identified. Marked body wall wasting with near complete lack of a mediastinal and intra-abdominal fat within the visualized upper abdomen. Review of the MIP images confirms the above findings. IMPRESSION: 1. No pulmonary embolism. 2. Pathologic thoracic adenopathy within the right paratracheal and subcarinal lymph node groups. Suspected retrocrural and perirenal adenopathy within the upper abdomen, not well delineated given the lack of intra-abdominal fat and phase of contrast enhancement. Widespread sclerotic metastatic disease throughout the visualized axial skeleton. Findings are in keeping with metastatic disease and correlation with serum PSA level may be helpful for further management. Dedicated nonemergent standard CT examination of the abdomen pelvis or PET CT examination may also be helpful for further evaluation. 3. 6 mm  noncalcified pulmonary nodules within the right upper lobe. Follow-up imaging will be dependent upon the patient's underlying malignancy and treatment plan. 4. Mild central pulmonary arterial enlargement in keeping with changes of pulmonary arterial hypertension. Mild relative enlargement of the right ventricle and shift of the intraventricular septum to the left suggesting elevated right heart pressure. Aortic Atherosclerosis (ICD10-I70.0) and Emphysema (ICD10-J43.9). Electronically Signed   By: Helyn Numbers M.D.   On: 11/19/2022 23:00    ASSESSMENT & PLAN:   62 y.o. male from [place] who primarily speaks [place] (very little [place]) presenting with shortness of breath, failure to thrive and generalized body pains   1,Newly diagnosed Metastatic prostate cancer with extensive skeletal lesions and thoracic adenopathy. Stage 5.  PSA level nearly 1500.   2.  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   NO evidence of monoclonal paraproteinemia to suggestive primary bone marrow disorder.   3.  Mild lymphocytosis lymphocyte count of 5.2k previously noted to be nonclonal.  Likely from chronic inflammation/reactive.  Previously has been as high as 9.2k   4.  Findings of pulmonary hypertension on CTA--echocardiogram  with normal EF 60-65%, grade 1 diastolic dysfunction   5.  Pulmonary embolism -- possible-- on ELiquis   6.  Severe protein calorie malnutrition with weight loss of 30 to 40 pounds due to his metastatic prostate cancer.  7. Cancer related pain  PLAN: -Discussed lab results from 12/02/2022 in detail with the patient. CBC shows slightly decreased hemoglobin of 11.4 g/dL, slightly decreased hematocrit of 35.4%, and slightly decreased platelets of 139 K. CMP shows decreased creatinine of 0.67. -Discussed with the patient the CT, bone scan results and lab results from hospital. Which showed metastatic prostate cancer.  -Discussed with the patient that  this is stage 4 Prostate cancer and that this cancer is treatable, but not curable.  -Educated the patient about his treatment and the goal of treatment. His treatment will include: Xtandi, Eligard q3 months starting in 1 week x 4 doses, and Zometa every 4 weeks . Also, we will prescribe high-dose of vitamin-D. Patient agrees with the treatment plan.  -if his performance status and nutritional status improves and based on available social support might need to consider palliative Docetaxel for high volume disease. -Recommend  calcium-rich diet.  -Discussed with the patient that we recommend not to lift anything heavy. Patient can work which does not include lifting, pushing, and pulling.  -Discussed the option to talk to social worker to apply for social security disability. Patient agrees to be connected with the Child psychotherapist.  -Will get patient connected with Child psychotherapist.  -Recommend to follow-up with the dentist and get clearance for bone strengthening shot.  -Discussed with the patient that we will prescribe his medication to the hospital pharmacy.  -Answered all of patient's question regarding the staging of his cancer and treatment plan.    FOLLOW-UP: Eligard q3 months starting in 1 week x 4 doses Zometa every 4 weeks starting 1 week Social worker consultation to address barriers to care (Language barrier, disability insurance) RTC with Dr Candise Che with labs in 4 weeks  .The total time spent in the appointment was 65 minutes* .  All of the patient's questions were answered with apparent satisfaction. The patient knows to call the clinic with any problems, questions or concerns.   Wyvonnia Lora MD MS AAHIVMS [facility] Hematology/Oncology Physician [facility]  .*Total Encounter Time as defined by the Centers for Medicare and Medicaid Services includes, in addition to the face-to-face time of a patient visit (documented in the note above) non-face-to-face time: obtaining and  reviewing outside history, ordering and reviewing medications, tests or procedures, care coordination (communications with other health care professionals or caregivers) and documentation in the medical record.  12/07/2022 9:17 AM   I,Param Shah,acting as a scribe for Wyvonnia Lora, MD.,have documented all relevant documentation on the behalf of Wyvonnia Lora, MD,as directed by  Wyvonnia Lora, MD while in the presence of Wyvonnia Lora, MD.  .I have reviewed the above documentation for accuracy and completeness, and I agree with the above. Johney Maine MD

## 2022-12-08 ENCOUNTER — Other Ambulatory Visit (HOSPITAL_COMMUNITY): Payer: Self-pay

## 2022-12-08 NOTE — Telephone Encounter (Signed)
Oral Oncology Patient Advocate Encounter  Prior Authorization for Arthur Ali has been approved.    PA# [id number] Effective dates: 12/08/22 through 12/07/23  Patient must fill at [facility].    Jinger Neighbors, CPhT-Adv Oncology Pharmacy Patient Advocate [facility] Direct Number: [phone]  Fax: [phone]

## 2022-12-08 NOTE — Progress Notes (Signed)
RN left message with patient's congregational nurse, Nicole Cella providing my contact information.

## 2022-12-09 ENCOUNTER — Telehealth: Payer: Self-pay | Admitting: Pharmacist

## 2022-12-09 ENCOUNTER — Inpatient Hospital Stay: Payer: BC Managed Care – PPO | Attending: Hematology | Admitting: Licensed Clinical Social Worker

## 2022-12-09 DIAGNOSIS — C61 Malignant neoplasm of prostate: Secondary | ICD-10-CM | POA: Insufficient documentation

## 2022-12-09 DIAGNOSIS — Z5111 Encounter for antineoplastic chemotherapy: Secondary | ICD-10-CM | POA: Insufficient documentation

## 2022-12-09 DIAGNOSIS — C7951 Secondary malignant neoplasm of bone: Secondary | ICD-10-CM | POA: Insufficient documentation

## 2022-12-09 NOTE — Progress Notes (Signed)
[facility] Clinical Social Work  Initial Assessment   Arthur Ali is a 62 y.o. year old male contacted by phone using interpreter.   Clinical Social Work was referred by medical provider for assessment of psychosocial needs.   SDOH (Social Determinants of Health) assessments performed: Yes   SDOH Screenings   Food Insecurity: No Food Insecurity (11/19/2022)  Housing: Low Risk  (11/19/2022)  Transportation Needs: No Transportation Needs (11/19/2022)  Utilities: Not At Risk (11/19/2022)  Depression (PHQ2-9): Low Risk  (11/19/2022)  Tobacco Use: Medium Risk (12/02/2022)     Distress Screen completed: No     No data to display            Family/Social Information:  Housing Arrangement: patient lives alone.  Pt originally from the [facility] and has been in the [place] for 3 years.  Pt is a green card holder. Family members/support persons in your life? Pt states he does not have any family members who are able to provide support.  Pt does have a daughter listed in the chart, but states she does not reside close by and works as a Social research officer, government full time.  Pt is connected to the congregational RN who has been assisting pt w/ transportation and organizing doctor's appointments.  Transportation concerns: no, not at present but may have a need for transportation services once treatment begins  Employment: Out on work excuse pt states he has been unable to work since being hospitalized and is waiting for the doctor to inform him if he will be able to return to work.  Income source: pt reports he is currently living off of savings and has only 1 month's worth of rent left in his savings. Financial concerns: Yes, current concerns Type of concern: Utilities, Rent/ mortgage, and Medical bills Food access concerns: yes Religious or spiritual practice: Yes-Christian Services Currently in place:  none  Coping/ Adjustment to diagnosis: Patient understands treatment plan and what happens next? no, pt is still  undergoing diagnostics/treatment not yet determined Concerns about diagnosis and/or treatment: Losing my job and/or losing income, Overwhelmed by information, How I will pay for the services I need, How will I care for myself, and Quality of life Patient reported stressors: Finances and Adjusting to my illness Hopes and/or priorities: pt's priority is to complete diagnostics and start treatment w/ the hope of positive results Patient enjoys  not addressed Current coping skills/ strengths: Capable of independent living  and Motivation for treatment/growth     SUMMARY: Current SDOH Barriers:  Financial constraints related to loss of employment, Limited social support, and Literacy concerns  Clinical Social Work Clinical Goal(s):  Explore community resource options for unmet needs related to:  Financial Strain   Interventions: Discussed common feeling and emotions when being diagnosed with cancer, and the importance of support during treatment Informed patient of the support team roles and support services at [facility] Provided CSW contact information and encouraged patient to call with any questions or concerns CSW spoke w/ the congregational RN Nicole Cella) who is assisting pt.  Per Nicole Cella pt is not eligible to apply for Medicaid until after his 63th birthday in January.  Pt will be assisted in applying for Medicaid, SSI and SNAP through the congregational RN.  CSW informed Nicole Cella that the [employer] is no longer available.  Pt will need to be assisted to reach out to the [employer] and the [employer] for rent and utility assistance.  Dorothy provided w/ contact information for CSW.  Follow Up Plan: Patient will contact CSW with any support or resource needs Patient verbalizes understanding of plan: Yes    Rachel Moulds, LCSW Clinical Social Worker [facility]

## 2022-12-10 ENCOUNTER — Telehealth: Payer: Self-pay | Admitting: Hematology

## 2022-12-10 ENCOUNTER — Other Ambulatory Visit (HOSPITAL_COMMUNITY): Payer: Self-pay

## 2022-12-10 ENCOUNTER — Telehealth: Payer: Self-pay | Admitting: Pharmacy Technician

## 2022-12-10 NOTE — Telephone Encounter (Signed)
Per 9/30 los was trying to reach patient through interpreter line was unsuccessful in completing the call due to patient answering and then hanging up, sending out mail reminder to patient regarding scheduled appointment times/dates on 10/3

## 2022-12-10 NOTE — Telephone Encounter (Signed)
Oral Oncology Patient Advocate Encounter   Was successful in obtaining a copay card for [facility].  This copay card will make the patients copay $0.  The billing information is as follows and has been shared with [facility].   RxBin: [id number] PCN: Loyalty Member ID: [id number] Group ID: [id number]   Jinger Neighbors, CPhT-Adv Oncology Pharmacy Patient Advocate [facility] Direct Number: [phone]  Fax: [phone]

## 2022-12-10 NOTE — Progress Notes (Signed)
SUBJECTIVE:   CHIEF COMPLAINT: check up HPI:   Arthur Ali is a 62 y.o.  with history notable for metastatic prostatic cancer, PE,  presenting for follow up.    The patient speaks [place] as their primary language.  An interpreter was used for the entire visit.   The patient reports overall he is doing okay. He is worried about many items. He feels burdened by his cancer. He is having difficulty paying for food. He has no savings left and cannot pay rent. He does not know when he will be asked to leave his house. He is not sure if he has medicaid.   The patients only concern is worsening lower extremity edema. This causes pain and makes it hard to walk at home. He feels weak and like he will be unable to return to work. His back pain persists. No difficulty urinating, numbness or difficulty controlling bladder. He has no pain medications at home. He has many bottles at home, some are empty some are full. He is not sure the names of the medications.  Denies fevers. Feels his breathing and CP is improved since the hospital. His appetite is also improved slightly.  PERTINENT  PMH / PSH/Family/Social History :  PE-- in hospital Metastatic prostate cance r  OBJECTIVE:   BP 137/88   Pulse 96   Wt 132 lb 3.2 oz (60 kg)   SpO2 99%   BMI 19.52 kg/m   Today's weight:  Last Weight  Most recent update: 12/14/2022 11:39 AM    Weight  60 kg (132 lb 3.2 oz)            Review of prior weights: Filed Weights   12/14/22 1139  Weight: 132 lb 3.2 oz (60 kg)    Pleasant man no distress Walks with antalgic gait, slowly RRR Lungs coarse bilaterally Abdomen soft mildly distended 2+ LE edema to ankles, pitting   ASSESSMENT/PLAN:   Assessment & Plan Prostate cancer metastatic to bone [facility]) Follows with Oncology  Has multiple visits scheduled Discussed in larger context, he is catholic, will continue to address wishes and goals of care  Will coordinate with Nicole Cella re:  dentistry  Pulmonary embolism and infarction (HCC) Unsure if he is taking eliquis Return 10/8 to review medications Cancer related pain Unsure what he is taking for pain Likely needs opioids given degree of pain As ADLS affected, RX for rollator Food insecurity Food bag given  Referral to Managed Medicaid  Housing insecurity Referral to manage Medicaid Dyslipidemia Will defer statin given muscle aches and current acute issues--pending progress with treatment may start statin Hyperkalemia Noted on prior labs repeat today Lower extremity edema Lower extremity DVT.  Also consider thyroid disease, severe anemia may be related to malnutrition liver disease or worsening renal function.  Will obtain labs today   Given the patient does not have his medications today and his pain is uncontrolled and I am uncertain what he is taking he will return tomorrow to review his medications.  At that time we will also continue to address goals of care.  I have messaged Dorothy to see if we can coordinate transportation.  Overall I am very concerned as he has limited support here, has significant financial strain and obviously cannot work due to his significant debility at this time.  Will continue support as best we can.  Flu shot given today.    Terisa Starr, MD  Family Medicine Teaching Service  [facility]

## 2022-12-12 ENCOUNTER — Other Ambulatory Visit (HOSPITAL_BASED_OUTPATIENT_CLINIC_OR_DEPARTMENT_OTHER): Payer: Self-pay

## 2022-12-13 ENCOUNTER — Encounter: Payer: Self-pay | Admitting: Hematology

## 2022-12-13 NOTE — Congregational Nurse Program (Signed)
  Dept: [phone]   Congregational Nurse Program Note  Date of Encounter: 12/13/2022  Past Medical History: Past Medical History:  Diagnosis Date   Back pain    Hypertension     Encounter Details:  CNP Questionnaire - 12/13/22 1431       Questionnaire   Ask client: Do you give verbal consent for me to treat you today? 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    Insurance/Financial Assistance Referral N/A    Medication Have Medication Insecurities    Medical Provider Yes    Screening Referrals Made N/A    Medical Referrals Made N/A    Medical Appointment Made N/A    Recently w/o PCP, now 1st time PCP visit completed due to CNs referral or appointment made Yes    Food Have Food Insecurities    Transportation Need transportation assistance;Provided transportation assistance    Housing/Utilities Unable to pay for utilities;Referred to housing/utility assistance program    Interpersonal Safety N/A    Interventions Advocate/Support;Navigate Healthcare System;Case Management;Counsel;Educate;Reviewed Medications    Abnormal to Normal Screening Since Last CN Visit N/A    Screenings CN Performed N/A    Sent Client to Lab for: N/A    Did client attend any of the following based off CNs referral or appointments made? N/A    ED Visit Averted Yes    Life-Saving Intervention Made N/A            Patient contacted with appointment reminder. Transportation assistance will be provided.  Nicole Cella Margarette Vannatter RN BSN PCCN  [facility] [phone]-cell [phone]-office

## 2022-12-14 ENCOUNTER — Ambulatory Visit: Payer: BC Managed Care – PPO | Admitting: Family Medicine

## 2022-12-14 ENCOUNTER — Encounter: Payer: Self-pay | Admitting: Family Medicine

## 2022-12-14 ENCOUNTER — Other Ambulatory Visit: Payer: Self-pay

## 2022-12-14 ENCOUNTER — Telehealth: Payer: Self-pay | Admitting: Family Medicine

## 2022-12-14 VITALS — BP 137/88 | HR 96 | Wt 132.2 lb

## 2022-12-14 DIAGNOSIS — Z23 Encounter for immunization: Secondary | ICD-10-CM | POA: Diagnosis not present

## 2022-12-14 DIAGNOSIS — E875 Hyperkalemia: Secondary | ICD-10-CM

## 2022-12-14 DIAGNOSIS — G893 Neoplasm related pain (acute) (chronic): Secondary | ICD-10-CM

## 2022-12-14 DIAGNOSIS — E785 Hyperlipidemia, unspecified: Secondary | ICD-10-CM

## 2022-12-14 DIAGNOSIS — C61 Malignant neoplasm of prostate: Secondary | ICD-10-CM

## 2022-12-14 DIAGNOSIS — I2699 Other pulmonary embolism without acute cor pulmonale: Secondary | ICD-10-CM

## 2022-12-14 DIAGNOSIS — Z5941 Food insecurity: Secondary | ICD-10-CM | POA: Diagnosis not present

## 2022-12-14 DIAGNOSIS — Z59819 Housing instability, housed unspecified: Secondary | ICD-10-CM

## 2022-12-14 DIAGNOSIS — R600 Localized edema: Secondary | ICD-10-CM

## 2022-12-14 DIAGNOSIS — C7951 Secondary malignant neoplasm of bone: Secondary | ICD-10-CM

## 2022-12-14 NOTE — Telephone Encounter (Signed)
Patient has need for DME. I have ordered  rollator . I am routing note for [facility] RN Pool.   [name], MD

## 2022-12-14 NOTE — Telephone Encounter (Signed)
Community message sent to [employer]. Will await response.   Jozlyn Schatz C January Bergthold, RN  

## 2022-12-14 NOTE — Assessment & Plan Note (Signed)
Unsure what he is taking for pain Likely needs opioids given degree of pain As ADLS affected, RX for rollator

## 2022-12-14 NOTE — Patient Instructions (Signed)
It was wonderful to see you today.  Please bring ALL of your medications with you to every visit.   Today we talked about:   - Getting blood work  - Please return tomorrow at 10 AM  -- I will call Nicole Cella about transport tomorrow    BRING ALL OF YOUR MEDICATIONS TOMORROW   Follow up in 1 day    Thank you for choosing [facility].   Please call [phone] with any questions about today's appointment.  Please be sure to schedule follow up at the front  desk before you leave today.   Terisa Starr, MD  Family Medicine

## 2022-12-14 NOTE — Assessment & Plan Note (Signed)
Follows with Oncology  Has multiple visits scheduled Discussed in larger context, he is catholic, will continue to address wishes and goals of care  Will coordinate with Nicole Cella re: dentistry

## 2022-12-15 ENCOUNTER — Other Ambulatory Visit (HOSPITAL_COMMUNITY): Payer: Self-pay

## 2022-12-15 ENCOUNTER — Ambulatory Visit (INDEPENDENT_AMBULATORY_CARE_PROVIDER_SITE_OTHER): Payer: BC Managed Care – PPO | Admitting: Family Medicine

## 2022-12-15 ENCOUNTER — Telehealth: Payer: Self-pay

## 2022-12-15 ENCOUNTER — Encounter: Payer: Self-pay | Admitting: Hematology

## 2022-12-15 ENCOUNTER — Other Ambulatory Visit: Payer: Self-pay

## 2022-12-15 VITALS — BP 137/90 | HR 104 | Ht 69.0 in | Wt 135.0 lb

## 2022-12-15 DIAGNOSIS — C7951 Secondary malignant neoplasm of bone: Secondary | ICD-10-CM | POA: Diagnosis not present

## 2022-12-15 DIAGNOSIS — C61 Malignant neoplasm of prostate: Secondary | ICD-10-CM | POA: Diagnosis not present

## 2022-12-15 DIAGNOSIS — M25473 Effusion, unspecified ankle: Secondary | ICD-10-CM

## 2022-12-15 DIAGNOSIS — Z5941 Food insecurity: Secondary | ICD-10-CM | POA: Diagnosis not present

## 2022-12-15 LAB — BASIC METABOLIC PANEL
BUN/Creatinine Ratio: 27 — ABNORMAL HIGH (ref 10–24)
BUN: 17 mg/dL (ref 8–27)
CO2: 26 mmol/L (ref 20–29)
Calcium: 8.5 mg/dL — ABNORMAL LOW (ref 8.6–10.2)
Chloride: 97 mmol/L (ref 96–106)
Creatinine, Ser: 0.63 mg/dL — ABNORMAL LOW (ref 0.76–1.27)
Glucose: 79 mg/dL (ref 70–99)
Potassium: 4.2 mmol/L (ref 3.5–5.2)
Sodium: 140 mmol/L (ref 134–144)
eGFR: 106 mL/min/{1.73_m2} (ref >59)

## 2022-12-15 LAB — HEPATIC FUNCTION PANEL
ALT: 26 [IU]/L (ref 0–44)
AST: 17 [IU]/L (ref 0–40)
Albumin: 3.4 g/dL — ABNORMAL LOW (ref 3.9–4.9)
Alkaline Phosphatase: 520 [IU]/L — ABNORMAL HIGH (ref 44–121)
Bilirubin Total: <0.2 mg/dL (ref 0.0–1.2)
Bilirubin, Direct: <0.1 mg/dL (ref 0.00–0.40)
Total Protein: 7 g/dL (ref 6.0–8.5)

## 2022-12-15 LAB — CBC
Hematocrit: 35.4 % — ABNORMAL LOW (ref 37.5–51.0)
Hemoglobin: 11.6 g/dL — ABNORMAL LOW (ref 13.0–17.7)
MCH: 28.5 pg (ref 26.6–33.0)
MCHC: 32.8 g/dL (ref 31.5–35.7)
MCV: 87 fL (ref 79–97)
NRBC: 1 % — ABNORMAL HIGH (ref 0–0)
RBC: 4.07 x10E6/uL — ABNORMAL LOW (ref 4.14–5.80)
RDW: 15.8 % — ABNORMAL HIGH (ref 11.6–15.4)
WBC: 8.8 10*3/uL (ref 3.4–10.8)

## 2022-12-15 LAB — BRAIN NATRIURETIC PEPTIDE: BNP: 69.5 pg/mL (ref 0.0–100.0)

## 2022-12-15 LAB — TSH RFX ON ABNORMAL TO FREE T4: TSH: 0.904 u[IU]/mL (ref 0.450–4.500)

## 2022-12-15 MED ORDER — POLYETHYLENE GLYCOL 3350 17 GM/SCOOP PO POWD
17.0000 g | Freq: Two times a day (BID) | ORAL | 1 refills | Status: AC | PRN
Start: 2022-12-15 — End: ?
  Filled 2022-12-15: qty 952, 28d supply, fill #0

## 2022-12-15 MED ORDER — OXYCODONE HCL 5 MG PO TABS
5.0000 mg | ORAL_TABLET | Freq: Four times a day (QID) | ORAL | 0 refills | Status: DC | PRN
Start: 2022-12-15 — End: 2022-12-28
  Filled 2022-12-15: qty 30, 8d supply, fill #0

## 2022-12-15 MED ORDER — VITAMIN D (ERGOCALCIFEROL) 1.25 MG (50000 UNIT) PO CAPS
50000.0000 [IU] | ORAL_CAPSULE | ORAL | 0 refills | Status: DC
  Filled 2022-12-15: qty 12, 84d supply, fill #0

## 2022-12-15 MED ORDER — SENNA 8.6 MG PO TABS
1.0000 | ORAL_TABLET | Freq: Every day | ORAL | 3 refills | Status: DC
Start: 2022-12-15 — End: 2023-03-23
  Filled 2022-12-15: qty 100, 100d supply, fill #0
  Filled 2023-03-23: qty 20, 20d supply, fill #1

## 2022-12-15 NOTE — Congregational Nurse Program (Signed)
  Dept: [phone]   Congregational Nurse Program Note  Date of Encounter: 12/15/2022  Past Medical History: Past Medical History:  Diagnosis Date   Back pain    Hypertension         Prostate cancer   Encounter Details:  CNP Questionnaire - 12/15/22 1332       Questionnaire   Ask client: Do you give verbal consent for me to treat you today? 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    Insurance/Financial Assistance Referral N/A    Medication Have Medication Insecurities;Provided Medication Assistance;Referred to Medication Assistance;Made Appointment for Client    Medical Provider Yes    Screening Referrals Made N/A    Medical Referrals Made N/A    Medical Appointment Made N/A    Recently w/o PCP, now 1st time PCP visit completed due to CNs referral or appointment made Yes    Food Have Food Insecurities    Transportation Need transportation assistance;Provided transportation assistance    Housing/Utilities Unable to pay for utilities;Referred to housing/utility assistance program    Interpersonal Safety N/A    Interventions Advocate/Support;Navigate Healthcare System;Case Management;Counsel;Educate;Reviewed Medications    Abnormal to Normal Screening Since Last CN Visit N/A    Screenings CN Performed N/A    Sent Client to Lab for: N/A    Did client attend any of the following based off CNs referral or appointments made? N/A    ED Visit Averted Yes    Life-Saving Intervention Made N/A            [facility] and updated [facility] and telephone number. Co pay card info on file.and will be used  to cover co pay. Medication Arthur Ali will be sent to the house and will be delivered tomorrow.  Nicole Cella Mekaila Tarnow RN BSN PCCN  [facility] [phone]

## 2022-12-15 NOTE — Congregational Nurse Program (Signed)
  Dept: [phone]   Congregational Nurse Program Note  Date of Encounter: 12/15/2022  Past Medical History: Past Medical History:  Diagnosis Date   Back pain    Hypertension     Encounter Details:  CNP Questionnaire - 12/15/22 0816       Questionnaire   Ask client: Do you give verbal consent for me to treat you today? 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    Insurance/Financial Assistance Referral N/A    Medication Have Medication Insecurities    Medical Provider Yes    Screening Referrals Made N/A    Medical Referrals Made N/A    Medical Appointment Made N/A    Recently w/o PCP, now 1st time PCP visit completed due to CNs referral or appointment made Yes    Food Have Food Insecurities    Transportation Need transportation assistance;Provided transportation assistance    Housing/Utilities Unable to pay for utilities;Referred to housing/utility assistance program    Interpersonal Safety N/A    Interventions Advocate/Support;Navigate Healthcare System;Case Management;Counsel;Educate;Reviewed Medications    Abnormal to Normal Screening Since Last CN Visit N/A    Screenings CN Performed N/A    Sent Client to Lab for: N/A    Did client attend any of the following based off CNs referral or appointments made? N/A    ED Visit Averted Yes    Life-Saving Intervention Made N/A            I have called patient with appointment reminder for follow up with PCP. Instructed to bring his meds to the appointment. Transportation assistance provided. Pick up 0930 am.  Arman Bogus RN BSN PCCN  [facility] [phone]-cell [phone]-office

## 2022-12-15 NOTE — Progress Notes (Addendum)
    SUBJECTIVE:   CHIEF COMPLAINT / HPI:   In person interpreter who speaks [place] Plants was present for entire visit.   Patient presented today to review his medications.  He is currently taking the following: Eliquis 5mg  every day, has not missed any doses Tylenol 500mg  two tablets every day  Bicalutamide 50mg  - ran out, he is unsure how long ago Oxycodone 5mg  - ran out, he is unsure how long ago Dexamethasone taper - still 7 pills left, reports he has been taking two, 2mg  tablets everyday  Patient reports intermittent chest pain and shortness of breath since prior to his hospitalization.  States that it is mainly when he ambulates or exerts himself.  He also reports bilateral ankle swelling since he left the hospital.  States that his ankles are occasionally painful when he walks. These were addressed at his last office visit yesterday.   Reports that he had milk and bread this morning to eat.  Patient does express concern that he is running out of food and is unable to work.   PERTINENT  PMH / PSH:  Metastatic prostate cancer PE   OBJECTIVE:   BP (!) 137/90   Pulse (!) 104   Ht 5\' 9"  (1.753 m)   Wt 135 lb (61.2 kg)   SpO2 96%   BMI 19.94 kg/m   Gen: chronically ill appearing, no acute distress MSK: 2+ pitting edema bilateral ankles. No TTP to BLE. Antalgic gait CV: tachycardic, no murmurs Resp: CTAB, no increased WOB on RA  ASSESSMENT/PLAN:   Prostate cancer metastatic to bone [facility]) - Per chart review, patient was to start taking Xtandi for his prostate cancer.  It appears that this medication was authorized and has a $0 co-pay at his pharmacy but patient has not been able to fill it.  Reached out to Nicole Cella and the pharmacist who wrote the note requesting if they can either send this prescription to a more accessible pharmacy or mail it to patient.  The empty Bicalutamide bottle was thrown away today. - Refilled his oxycodone 5 mg for pain.  Patient can continue taking  Tylenol. Rx senna and miralax for constipation r/t opioid use - Refilled vitamin D 40981 units once a week - Patient can finish dexamethasone pills. - Advised to continue Eliquis.  Advised if he runs out to contact us or [facility] immediately. - Follow-up appointment scheduled for November 12th with Dr. Manson Passey  Ankle swelling Consider anemia, malnutrition, or gout - CBC, uric acid collected today  Food insecurity Food bag given today.  Manage Medicaid referral sent yesterday.  Elevated blood pressure Monitor for now, feeling unsteady on his feet so will defer therapy at this time   PDMP reviewed, appropriate  Needs Hep A vaccine at follow up  Para March, DO [facility]

## 2022-12-15 NOTE — Assessment & Plan Note (Signed)
Consider anemia, malnutrition, or gout - CBC, uric acid collected today

## 2022-12-15 NOTE — Telephone Encounter (Signed)
Patient called for appointment reminder. Transportation assistance will be provided. Patient also reminded that medication [name] will be delivered to his home tomorrow. He does not need to sign for this medication.  Nicole Cella Izaiha Lo RN BSN PCCN  [facility] [phone]-cell [phone]-office

## 2022-12-15 NOTE — Progress Notes (Signed)
This encounter was created in error - please disregard.

## 2022-12-15 NOTE — Congregational Nurse Program (Signed)
  Dept: [phone]   Congregational Nurse Program Note  Date of Encounter: 12/15/2022  Past Medical History: Past Medical History:  Diagnosis Date   Back pain    Hypertension     Encounter Details:  CNP Questionnaire - 12/15/22 1332       Questionnaire   Ask client: Do you give verbal consent for me to treat you today? 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    Insurance/Financial Assistance Referral N/A    Medication Have Medication Insecurities;Provided Medication Assistance;Referred to Medication Assistance;Made Appointment for Client    Medical Provider Yes    Screening Referrals Made N/A    Medical Referrals Made N/A    Medical Appointment Made N/A    Recently w/o PCP, now 1st time PCP visit completed due to CNs referral or appointment made Yes    Food Have Food Insecurities    Transportation Need transportation assistance;Provided transportation assistance    Housing/Utilities Unable to pay for utilities;Referred to housing/utility assistance program    Interpersonal Safety N/A    Interventions Advocate/Support;Navigate Healthcare System;Case Management;Counsel;Educate;Reviewed Medications    Abnormal to Normal Screening Since Last CN Visit N/A    Screenings CN Performed N/A    Sent Client to Lab for: N/A    Did client attend any of the following based off CNs referral or appointments made? N/A    ED Visit Averted Yes    Life-Saving Intervention Made N/A                  Dept: [phone]   Congregational Nurse Program Note  Date of Encounter: 12/15/2022  Past Medical History: Past Medical History:  Diagnosis Date   Back pain    Hypertension     Encounter Details:  CNP Questionnaire - 12/15/22 1332       Questionnaire   Ask client: Do you give verbal consent for me to treat you  today? 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    Insurance/Financial Assistance Referral N/A    Medication Have Medication Insecurities;Provided Medication Assistance;Referred to Medication Assistance;Made Appointment for Client    Medical Provider Yes    Screening Referrals Made N/A    Medical Referrals Made N/A    Medical Appointment Made N/A    Recently w/o PCP, now 1st time PCP visit completed due to CNs referral or appointment made Yes    Food Have Food Insecurities    Transportation Need transportation assistance;Provided transportation assistance    Housing/Utilities Unable to pay for utilities;Referred to housing/utility assistance program    Interpersonal Safety N/A    Interventions Advocate/Support;Navigate Healthcare System;Case Management;Counsel;Educate;Reviewed Medications    Abnormal to Normal Screening Since Last CN Visit N/A    Screenings CN Performed N/A    Sent Client to Lab for: N/A    Did client attend any of the following based off CNs referral or appointments made? N/A    ED Visit Averted Yes    Life-Saving Intervention Made N/A

## 2022-12-15 NOTE — Assessment & Plan Note (Addendum)
-   Per chart review, patient was to start taking Xtandi for his prostate cancer.  It appears that this medication was authorized and has a $0 co-pay at his pharmacy but patient has not been able to fill it.  Reached out to Nicole Cella and the pharmacist who wrote the note requesting if they can either send this prescription to a more accessible pharmacy or mail it to patient.  The empty Bicalutamide bottle was thrown away today. - Refilled his oxycodone 5 mg for pain.  Patient can continue taking Tylenol. Rx senna and miralax for constipation r/t opioid use - Refilled vitamin D 81191 units once a week - Patient can finish dexamethasone pills. - Advised to continue Eliquis.  Advised if he runs out to contact us or [facility] immediately. - Follow-up appointment scheduled for November 12th with Dr. Manson Passey

## 2022-12-15 NOTE — Assessment & Plan Note (Signed)
Food bag given today.  Manage Medicaid referral sent yesterday.

## 2022-12-15 NOTE — Patient Instructions (Signed)
It was great to see you today! Thank you for choosing [facility] for your primary care. Arthur Ali was seen for a follow up appointment to discuss medications.  Today we addressed: For pain, we sent in a medication called Oxycodone to the [facility] We have sent in a medication to help you poop. Your pain medicine can make it hard to poop. Take one pill of Senna every day and mix the Miralax in a cup of water twice a day.  Someone will reach out to you about the medication for your prostate cancer and mail it to your house We have sent in a vitamin D pill to take once a week.  Continue taking your Eliquis pill every day. Do not miss any doses.  If you haven't already, sign up for My Chart to have easy access to your labs results, and communication with your primary care physician.  We are checking some labs today. We will call you if these are abnormal.   You should return to our clinic November 12 at 1:30pm   Thank you for allowing me to participate in your care, Arthur March, DO 12/15/2022, 11:05 AM PGY-1, [facility]

## 2022-12-16 ENCOUNTER — Inpatient Hospital Stay: Payer: BC Managed Care – PPO

## 2022-12-16 ENCOUNTER — Telehealth: Payer: Self-pay | Admitting: Family Medicine

## 2022-12-16 VITALS — BP 143/82 | HR 101 | Temp 97.9°F | Resp 18 | Ht 69.0 in | Wt 132.1 lb

## 2022-12-16 DIAGNOSIS — Z5111 Encounter for antineoplastic chemotherapy: Secondary | ICD-10-CM | POA: Diagnosis present

## 2022-12-16 DIAGNOSIS — C7951 Secondary malignant neoplasm of bone: Secondary | ICD-10-CM | POA: Diagnosis not present

## 2022-12-16 DIAGNOSIS — C61 Malignant neoplasm of prostate: Secondary | ICD-10-CM | POA: Diagnosis not present

## 2022-12-16 LAB — CBC WITH DIFFERENTIAL (CANCER CENTER ONLY)
Abs Immature Granulocytes: 0.16 10*3/uL — ABNORMAL HIGH (ref 0.00–0.07)
Basophils Absolute: 0.1 10*3/uL (ref 0.0–0.1)
Basophils Relative: 1 %
Eosinophils Absolute: 0 10*3/uL (ref 0.0–0.5)
Eosinophils Relative: 0 %
HCT: 38.1 % — ABNORMAL LOW (ref 39.0–52.0)
Hemoglobin: 11.9 g/dL — ABNORMAL LOW (ref 13.0–17.0)
Immature Granulocytes: 2 %
Lymphocytes Relative: 47 %
Lymphs Abs: 4.8 10*3/uL — ABNORMAL HIGH (ref 0.7–4.0)
MCH: 28.7 pg (ref 26.0–34.0)
MCHC: 31.2 g/dL (ref 30.0–36.0)
MCV: 92 fL (ref 80.0–100.0)
Monocytes Absolute: 0.5 10*3/uL (ref 0.1–1.0)
Monocytes Relative: 5 %
Neutro Abs: 4.6 10*3/uL (ref 1.7–7.7)
Neutrophils Relative %: 45 %
Platelet Count: 151 10*3/uL (ref 150–400)
RBC: 4.14 MIL/uL — ABNORMAL LOW (ref 4.22–5.81)
RDW: 17.5 % — ABNORMAL HIGH (ref 11.5–15.5)
WBC Count: 10.1 10*3/uL (ref 4.0–10.5)
nRBC: 1.3 % — ABNORMAL HIGH (ref 0.0–0.2)

## 2022-12-16 LAB — CMP (CANCER CENTER ONLY)
ALT: 30 U/L (ref 0–44)
AST: 18 U/L (ref 15–41)
Albumin: 3.3 g/dL — ABNORMAL LOW (ref 3.5–5.0)
Alkaline Phosphatase: 410 U/L — ABNORMAL HIGH (ref 38–126)
Anion gap: 5 (ref 5–15)
BUN: 27 mg/dL — ABNORMAL HIGH (ref 8–23)
CO2: 30 mmol/L (ref 22–32)
Calcium: 9 mg/dL (ref 8.9–10.3)
Chloride: 105 mmol/L (ref 98–111)
Creatinine: 0.64 mg/dL (ref 0.61–1.24)
GFR, Estimated: >60 mL/min (ref >60)
Glucose, Bld: 99 mg/dL (ref 70–99)
Potassium: 3.9 mmol/L (ref 3.5–5.1)
Sodium: 140 mmol/L (ref 135–145)
Total Bilirubin: 0.3 mg/dL (ref 0.3–1.2)
Total Protein: 7.7 g/dL (ref 6.5–8.1)

## 2022-12-16 LAB — CBC
Hematocrit: 39.1 % (ref 37.5–51.0)
Hemoglobin: 12.2 g/dL — ABNORMAL LOW (ref 13.0–17.7)
MCH: 27.5 pg (ref 26.6–33.0)
MCHC: 31.2 g/dL — ABNORMAL LOW (ref 31.5–35.7)
MCV: 88 fL (ref 79–97)
NRBC: 1 % — ABNORMAL HIGH (ref 0–0)
Platelets: 99 10*3/uL — CL (ref 150–450)
RBC: 4.43 x10E6/uL (ref 4.14–5.80)
RDW: 16 % — ABNORMAL HIGH (ref 11.6–15.4)
WBC: 10.1 10*3/uL (ref 3.4–10.8)

## 2022-12-16 LAB — URIC ACID: Uric Acid: 5.5 mg/dL (ref 3.8–8.4)

## 2022-12-16 LAB — VITAMIN D 25 HYDROXY (VIT D DEFICIENCY, FRACTURES): Vit D, 25-Hydroxy: 37.86 ng/mL (ref 30–100)

## 2022-12-16 MED ORDER — SODIUM CHLORIDE 0.9 % IV SOLN: Freq: Once | INTRAVENOUS | Status: AC

## 2022-12-16 MED ORDER — ZOLEDRONIC ACID 4 MG/100ML IV SOLN
4.0000 mg | Freq: Once | INTRAVENOUS | Status: AC
  Administered 2022-12-16: 4 mg via INTRAVENOUS
  Filled 2022-12-16: qty 100

## 2022-12-16 NOTE — Patient Instructions (Signed)

## 2022-12-16 NOTE — Telephone Encounter (Signed)
-----   Message from [facility] McDiarmid sent at 12/16/2022  8:29 AM EDT -----  ----- Message ----- From: Nell Range Lab Results In Sent: 12/16/2022   8:15 AM EDT To: Leighton Roach McDiarmid, MD

## 2022-12-16 NOTE — Telephone Encounter (Signed)
Oral Chemotherapy Pharmacist Encounter   Notified that patient's Arthur Ali (enzalutamide) was successfully set up for shipment through [facility] and will deliver to patient's home on 12/16/22.  Lenord Carbo, PharmD, BCPS, BCOP Hematology/Oncology Clinical Pharmacist [facility] Oral Chemotherapy Navigation Clinics [phone] 12/16/2022 10:19 AM

## 2022-12-16 NOTE — Telephone Encounter (Signed)
The patient speaks [place] as their primary language.  An interpreter was used for the entire visit.  Called with results. Discussed low platelets in plain language to best of ability. Discussed he needs to call Nicole Cella or our office if he has any bleeding.   Terisa Starr, MD  Family Medicine Teaching Service

## 2022-12-17 LAB — PSA, TOTAL AND FREE
PSA, Free Pct: 19.2 %
PSA, Free: 45 ng/mL
Prostate Specific Ag, Serum: 234 ng/mL — ABNORMAL HIGH (ref 0.0–4.0)

## 2022-12-18 ENCOUNTER — Ambulatory Visit: Payer: BC Managed Care – PPO

## 2022-12-18 ENCOUNTER — Other Ambulatory Visit (HOSPITAL_COMMUNITY): Payer: Self-pay

## 2022-12-18 ENCOUNTER — Inpatient Hospital Stay: Payer: BC Managed Care – PPO

## 2022-12-18 ENCOUNTER — Telehealth: Payer: Self-pay

## 2022-12-18 VITALS — BP 125/81 | HR 103 | Temp 98.3°F | Resp 18

## 2022-12-18 DIAGNOSIS — Z5111 Encounter for antineoplastic chemotherapy: Secondary | ICD-10-CM | POA: Diagnosis not present

## 2022-12-18 DIAGNOSIS — C7951 Secondary malignant neoplasm of bone: Secondary | ICD-10-CM

## 2022-12-18 MED ORDER — LEUPROLIDE ACETATE (3 MONTH) 22.5 MG ~~LOC~~ KIT
22.5000 mg | PACK | Freq: Once | SUBCUTANEOUS | Status: AC
  Administered 2022-12-18: 22.5 mg via SUBCUTANEOUS
  Filled 2022-12-18: qty 22.5

## 2022-12-18 NOTE — Telephone Encounter (Signed)
Noted missed appointment for today. I have contacted dr Clyda Greener office and patient can still be seen at 1045am. Transportation scheduled.  Nicole Cella Zaia Carre RN BSN PCCN  [facility] [phone]-cell [phone]-office

## 2022-12-21 ENCOUNTER — Telehealth: Payer: Self-pay | Admitting: *Deleted

## 2022-12-21 NOTE — Progress Notes (Signed)
Care Coordination   Note   12/21/2022 Name: Arthur Ali MRN: [id number] DOB: 1960/07/26  Arthur Ali is a 62 y.o. year old male who sees [name], MD for primary care. I reached out to [employer] by phone today using [employer], Merilynn Finland Id# [id number] to offer care coordination services.  Arthur Ali was given information about Care Coordination services today including:   The Care Coordination services include support from the care team which includes your Nurse Coordinator, Clinical Social Worker, or Pharmacist.  The Care Coordination team is here to help remove barriers to the health concerns and goals most important to you. Care Coordination services are voluntary, and the patient may decline or stop services at any time by request to their care team member.   Care Coordination Consent Status: Patient agreed to services and verbal consent obtained.   Follow up plan:  Telephone appointment with care coordination team member scheduled for:  12/25/22  Encounter Outcome:  Patient Scheduled [facility] Coordination Care Guide  Direct Dial: [phone]

## 2022-12-22 NOTE — Patient Outreach (Signed)
Care Coordination   Documentation  Note   12/22/2022 Name: Tiam Neller MRN: [id number] DOB: March 23, 1960  Bishara Rideaux is a 62 y.o. year old male who sees [name], MD for primary care. I  collaborated with the patients congregation nurse Nicole Cella as well as his Sports coach from the [employer] regarding referral for resource needs.  What matters to the patients health and wellness today?  SW did not speak with the patient today.   SDOH assessments and interventions completed:  No     Care Coordination Interventions:  Yes, provided   Interventions Today    Flowsheet Row Most Recent Value  General Interventions   General Interventions Discussed/Reviewed Communication with  Communication with RN  [Communication with Druscilla Brownie RN who advises patient is engaged with a case manager from [employer] who is assisting with resource needs. Discussed plan for SW to contact pt as scheduled and advise to follow up with  case manager.]        Follow up plan:  SW will contact the patient as scheduled on 10/18.    Encounter Outcome:  Patient Visit Completed   Bevelyn Ngo, BSW, CDP [facility]  [facility], [facility] Social Worker Direct Dial: [phone]  Fax: [phone]

## 2022-12-23 ENCOUNTER — Telehealth: Payer: Self-pay | Admitting: Pharmacist

## 2022-12-23 ENCOUNTER — Encounter: Payer: Self-pay | Admitting: Family Medicine

## 2022-12-23 LAB — GLUCOSE, POCT (MANUAL RESULT ENTRY): POC Glucose: 71 mg/dL (ref 70–99)

## 2022-12-23 NOTE — Congregational Nurse Program (Signed)
  Dept: [phone]   Congregational Nurse Program Note  Date of Encounter: 12/23/2022  Past Medical History: Past Medical History:  Diagnosis Date   Back pain    Hypertension     Encounter Details:  Community Questionnaire - 12/23/22 1018       Questionnaire   Ask client: Do you give verbal consent for me to treat you today? --             Pt. Came in for follow up concerning his medications. Routine blood pressure and glucose check Dept: [phone]   Congregational Nurse Program Note  Date of Encounter: 12/23/2022  Past Medical History: Past Medical History:  Diagnosis Date   Back pain    Hypertension     Encounter Details:  Community Questionnaire - 12/23/22 1018       Questionnaire   Ask client: Do you give verbal consent for me to treat you today? --             Dept: [phone]   Congregational Nurse Program Note  Date of Encounter: 12/23/2022  Past Medical History: Past Medical History:  Diagnosis Date   Back pain    Hypertension     Encounter Details:  Community Questionnaire - 12/23/22 1053       Questionnaire   Ask client: Do you give verbal consent for me to treat you today? Yes    Student Assistance [employer] Nurse    Location Patient Served  [facility]    Encounter Setting CN site    Population Status Migrant/Refugee    Engineer, building services or [place] Insurance    Insurance/Financial Assistance Referral N/A    Medication Have Medication Insecurities;Provided Medication Assistance    Medical Provider Yes    Screening Referrals Made N/A    Medical Referrals Made N/A    Medical Appointment Completed N/A    CNP Interventions Advocate/Support;Navigate Healthcare System;Case Management;Counsel;Educate;Reviewed Medications    Screenings CN Performed Blood Pressure;Blood Glucose    ED Visit Averted Yes    Life-Saving Intervention Made N/A            Pt. Presents for follow up concerning medications. Routine BP check and Glucose  check performed. Pt. Educated about medications w/ collaboration of pharmacist. Education provided on how to take Xtandi, side effects explained by pharmacist. All questions answered. Teach back method used. Noted a blood sugar of 71mg /dl pt. Reports that they have not had food in the house and has not eaten as result. Snack and water provided. Refer to case manager here at [facility] for immediate assistance  Nicole Cella Mesha Schamberger RN BSN PCCN  [facility] [phone]-cell [phone]-office

## 2022-12-23 NOTE — Telephone Encounter (Signed)
Oral Chemotherapy Pharmacist Encounter  I spoke with patient with interpreter Fabio Neighbors as well as Arman Bogus, RN, BSN, PCCN present on call for overview of: Arthur Ali (enzalutamide) for the treatment of metastatic, castration-sensitive prostate cancer in conjunction with ADT, planned duration until disease progression or unacceptable toxicity.   Counseled patient on administration, dosing, side effects, monitoring, drug-food interactions, safe handling, storage, and disposal.  Patient will take Xtandi 80mg  tablets, 2 tablets (160mg  total) by mouth once daily without regard to food.  Advised patient to avoid grapefruit, grapefruit juice and seville oranges while on Xtandi.   Xtandi start date: 12/22/22  Adverse effects include but are not limited to: hypertension, hot flashes, fatigue, and arthralgias.    Reviewed with patient importance of keeping a medication schedule and plan for any missed doses. No barriers to medication adherence identified.  Medication reconciliation performed and medication/allergy list updated. Patient informed of drug-drug interaction between [place] and Eliquis and was advised if he has any trouble breathing and/or signs of PE to seek emergency help ASAP. Dr. Candise Che is aware of drug interaction and still wants to proceed with therapy as is.   Medication is being filled through [facility] - medication has already been delivered to patient's home.   All questions answered.  Mr. [name] voiced understanding and appreciation.   Medication education handout placed in mail for patient. Patient knows to call the office with questions or concerns. Oral Chemotherapy Clinic phone number provided to patient.   Lenord Carbo, PharmD, BCPS, BCOP Hematology/Oncology Clinical Pharmacist [facility] Oral Chemotherapy Navigation Clinics [phone] 12/23/2022 10:40 AM

## 2022-12-25 ENCOUNTER — Ambulatory Visit: Payer: Self-pay

## 2022-12-25 NOTE — Patient Outreach (Signed)
Care Coordination   Initial Visit Note   12/25/2022 Name: Arthur Ali MRN: [id number] DOB: Nov 06, 1960  Arthur Ali is a 62 y.o. year old male who sees [name], MD for primary care. I spoke with  [name] by phone today.  What matters to the patients health and wellness today?  SW spoke with the patient utilizing [employer] "Fayrene Fearing" interpretor ID [id number]. Discussed the patient needs assistance obtaining food in his home.     Goals Addressed             This Visit's Progress    COMPLETED: Care Coordination Activities       Care Coordination Interventions: Patient reports he does not have food in his home and does not have funds to purchase food at this time. Patient reports he is unable to obtain food from a food pantry due to lack of transportation and leg swelling. Patient states his legs are very swollen and painful. He reports "breathing fast" upon ambulation Assessed if these are new symptoms or if he needs medical treatment today. Patient declines care today stating his medications are helping Performed chart review to note patient is scheduled to see his primary care provider on 10/21. Collaboration with both Dr. Manson Passey and [name] Congregational RN to report concern for leg swelling and changes in breathing when ambulating Advised the patient SW will look into resource options on his behalf Collaboration with Ronda Fairly, LCSW with the cancer center who advises the patient may obtain a food bag on each appointment at the cancer center Collaboration with [name] Congregational RN who advises she plans to deliver food to the patients home today. Nicole Cella also reports the patients case manager at the [employer] has submitted an application for food and nutrition services Discussed the patient was able to pay his rent for the month of October. Nicole Cella is hopeful to identify a resource to assist with November rent Provided  Nicole Cella with information about [employer] Helping Hands program - Nicole Cella will assist the patient with this application in November Collaboration with patients primary care provider to advise this SW plans to close patient case considering [name] Congregational Nurse and [employer] is involved and assisting with the patients case management needs         SDOH assessments and interventions completed:  Yes  SDOH Interventions Today    Flowsheet Row Most Recent Value  SDOH Interventions   Food Insecurity Interventions Other (Comment)  [Pt reports he is out of food. Urgent referral placed to [facility]. Spoke with Congregational RN who is visiting a food pantry today for the patient. She also has a $30 donation to purchase items for the patient at the store.]  Housing Interventions Other (Comment)  [Patient has paid October rent. Education provided to the congregational RN re: [facility] Helping Hands program. She will assist with application when it opens in November if needed.]  Transportation Interventions Other (Comment)  [Pt receives transportation assistance from the congregational nurse program]  Financial Strain Interventions Other (Comment)  [Patient working with [facility] to apply for benefits to assist with financial strain]        Care Coordination Interventions:  Yes, provided   Interventions Today    Flowsheet Row Most Recent Value  Chronic Disease   Chronic disease during today's visit Other  [Food Insecurity]  General Interventions   General Interventions Discussed/Reviewed General Interventions Discussed, [employer], Communication with, Doctor Visits  [Referral to [employer] for grocery delivery,  education provided on [facility] Helping Hands fund]  Doctor Visits Discussed/Reviewed Doctor Visits Discussed  [Collaboration with patients primary care provider to advise of interventions to address SDOH needs,  reported concern with leg swelling/pain]   Communication with RN, Social Work  [employer] with Ronda Fairly LCSW and Arman Bogus RN]  Nutrition Interventions   Nutrition Discussed/Reviewed Nutrition Discussed        Follow up plan: No further intervention required.   Encounter Outcome:  Patient Visit Completed   Bevelyn Ngo, BSW, CDP [facility]  [facility], [facility] Social Worker Direct Dial: [phone]  Fax: [phone]

## 2022-12-25 NOTE — Patient Instructions (Signed)
Visit Information  Thank you for taking time to visit with me today. Please don't hesitate to contact me if I can be of assistance to you.   Following are the goals we discussed today:   Goals Addressed             This Visit's Progress    COMPLETED: Care Coordination Activities       Care Coordination Interventions: Patient reports he does not have food in his home and does not have funds to purchase food at this time. Patient reports he is unable to obtain food from a food pantry due to lack of transportation and leg swelling. Patient states his legs are very swollen and painful. He reports "breathing fast" upon ambulation Assessed if these are new symptoms or if he needs medical treatment today. Patient declines care today stating his medications are helping Performed chart review to note patient is scheduled to see his primary care provider on 10/21. Collaboration with both Dr. Manson Passey and [name] Congregational RN to report concern for leg swelling and changes in breathing when ambulating Advised the patient SW will look into resource options on his behalf Collaboration with Ronda Fairly, LCSW with the cancer center who advises the patient may obtain a food bag on each appointment at the cancer center Collaboration with [name] Congregational RN who advises she plans to deliver food to the patients home today. Nicole Cella also reports the patients case manager at the [employer] has submitted an application for food and nutrition services Discussed the patient was able to pay his rent for the month of October. Nicole Cella is hopeful to identify a resource to assist with November rent Provided Nicole Cella with information about [employer] program - Nicole Cella will assist the patient with this application in November Collaboration with patients primary care provider to advise this SW plans to close patient case considering [name] Congregational Nurse and [employer] is involved and assisting with the patients case management needs         If you are experiencing a Mental Health or Behavioral Health Crisis or need someone to talk to, please call 1-800-273-TALK (toll free, 24 hour hotline) go to [facility] [address] [place] [phone]) call 911  The patient verbalized understanding of instructions, educational materials, and care plan provided today and DECLINED offer to receive copy of patient instructions, educational materials, and care plan.   No further follow up required: The patient will work with Congregational Nurse [name] to address case management needs.  Bevelyn Ngo, BSW, CDP [facility]  [facility], [facility] Social Worker Direct Dial: [phone]  Fax: [phone]

## 2022-12-25 NOTE — Congregational Nurse Program (Signed)
  Dept: [phone]   Congregational Nurse Program Note  Date of Encounter: 12/25/2022  Past Medical History: Past Medical History:  Diagnosis Date   Back pain    Hypertension     Encounter Details:  Community Questionnaire - 12/25/22 1930       Questionnaire   Ask client: Do you give verbal consent for me to treat you today? Yes    Student Assistance N/A    Location Patient Served  NAI    Encounter Setting CN site    Population Status Migrant/Refugee    Engineer, building services or [place] Insurance    Insurance/Financial Assistance Referral N/A    Medication Have Medication Insecurities;Provided Medication Assistance;Referred to Medication Assistance    Medical Provider Yes    Screening Referrals Made N/A    Medical Referrals Made N/A    Medical Appointment Completed N/A    CNP Interventions Advocate/Support;Navigate Healthcare System;Case Management;Counsel;Educate;Reviewed Medications    Screenings CN Performed N/A    ED Visit Averted Yes    Life-Saving Intervention Made N/A      Questionnaire   Housing/Utilities Unable to pay for utilities;Referred to housing/utility assistance program            Home visit completed. Patient at home. Food delivered from a donation. Refrigerator in good working condition. Perishables stored in the refrigerator.   Patient examined, has BLE edema +1 advised to keep them elevated when sitting.Pain is well controlled with medication.Patient has my cell nunber to call for assistance if needed.   Nicole Cella Braelyn Jenson RN BSN PCCN  [facility] [phone]-cell [phone]-office

## 2022-12-28 ENCOUNTER — Ambulatory Visit: Payer: BC Managed Care – PPO | Admitting: Family Medicine

## 2022-12-28 ENCOUNTER — Other Ambulatory Visit (HOSPITAL_COMMUNITY): Payer: Self-pay

## 2022-12-28 ENCOUNTER — Other Ambulatory Visit: Payer: Self-pay

## 2022-12-28 ENCOUNTER — Encounter: Payer: Self-pay | Admitting: Family Medicine

## 2022-12-28 VITALS — BP 135/88 | HR 103 | Ht 69.0 in | Wt 132.0 lb

## 2022-12-28 DIAGNOSIS — Z59819 Housing instability, housed unspecified: Secondary | ICD-10-CM | POA: Diagnosis not present

## 2022-12-28 DIAGNOSIS — Z5941 Food insecurity: Secondary | ICD-10-CM | POA: Diagnosis not present

## 2022-12-28 DIAGNOSIS — G893 Neoplasm related pain (acute) (chronic): Secondary | ICD-10-CM | POA: Diagnosis not present

## 2022-12-28 MED ORDER — OXYCODONE HCL 5 MG PO TABS
5.0000 mg | ORAL_TABLET | Freq: Four times a day (QID) | ORAL | 0 refills | Status: DC | PRN
  Filled 2022-12-28 – 2022-12-29 (×2): qty 30, 8d supply, fill #0

## 2022-12-28 NOTE — Patient Instructions (Addendum)
It was wonderful to see you today.  Please bring ALL of your medications with you to every visit.   Today we talked about:  -- Nicole Cella is working on housing. I will check into options.  ---I am you received food  -- Please continue your medications. Call Dorothy or message if you run out.     Please follow up in 1 months   Thank you for choosing [facility].   Please call [phone] with any questions about today's appointment.  Please be sure to schedule follow up at the front  desk before you leave today.   Terisa Starr, MD  Family Medicine    Byari Lynnell Dike Elam Dutch uyumunsi.  [place] uzane imiti yawe yose Jesusita Oka buri gusura.   Uyu munsi twaganiriye:  -- Judy Pimple. Nzagenzura amahitamo.  ---Nakiriye ibiryo  -- [place] komeza imiti yawe. Hamagara Doroti cyangwa ubutumwa niba ubuze.     N[place] kurikirana mumezi 1   Urakoze guhitamo Ubuvuzi [employer].   Sherlene Shams [phone] hamwe nibibazo byose bijyanye na gahunda yuyu munsi.  [place] menya neza [place] yo gukurikirana [place] imbere mbere yuko ugenda uyumunsi.   Terisa Starr, MD  [employer] bwumuryango

## 2022-12-28 NOTE — Assessment & Plan Note (Signed)
Pt reports compliance with medications and denies difficulties taking these. Does report he needs a refill of his oxycodone for cancer-related pain. - Refill of oxycodone prescribed

## 2022-12-28 NOTE — Assessment & Plan Note (Signed)
Pt reports food insecurity has improved after receiving groceries recently. Will continue to provide pt with groceries as needed. Will continue to work with Nicole Cella from [employer] on getting pt food stamps.

## 2022-12-28 NOTE — Assessment & Plan Note (Signed)
Pt reports main concern is over housing insecurity; unsure how he will continue to afford rent through winter. Will collaborate with Nicole Cella at [employer] to work on November rent with goal of ensuring rent through the winter.

## 2022-12-28 NOTE — Congregational Nurse Program (Signed)
  Dept: [phone]   Congregational Nurse Program Note  Date of Encounter: 12/28/2022  Past Medical History: Past Medical History:  Diagnosis Date   Back pain    Hypertension     Encounter Details:  Community Questionnaire - 12/28/22 0953       Questionnaire   Ask client: Do you give verbal consent for me to treat you today? Yes    Student Assistance N/A    Location Patient Served  NAI    Encounter Setting CN site    Population Status Migrant/Refugee    Engineer, building services or [place] Insurance    Insurance/Financial Assistance Referral N/A    Medication Have Medication Insecurities;Provided Medication Assistance    Medical Provider Yes    Screening Referrals Made N/A    Medical Referrals Made N/A    Medical Appointment Completed N/A    CNP Interventions Advocate/Support;Navigate Healthcare System;Case Management;Counsel;Educate    Screenings CN Performed N/A    ED Visit Averted Yes    Life-Saving Intervention Made N/A            Transportation assistance provided for today`s visit with PCP.Patient called and made aware.  Nicole Cella Skyelyn Scruggs RN BSN PCCN  [facility] [phone]-cell [phone]-office

## 2022-12-28 NOTE — Progress Notes (Signed)
    SUBJECTIVE:   CHIEF COMPLAINT / HPI:   Arthur Ali is a 62 y.o. male who presents today for follow-up and medication management. The patient speaks [place] as their primary language.  An interpreter was used for the entire visit.   Pt reports he has improved food access through someone at a school and does not need food today. He does not yet have food stamps.  He reports his biggest concern is over housing. He still has housing currently, but is unsure how long this will last as he has run out of money for rent payments. He is not currently working. He is very afraid of being unhomed over the winter.  He brought in his medications today for review and reports no trouble taking them as prescribed.  He reports his edema has improved. He is taking Oxycodone 1-2 times per day. No falls or sedation. He is using miralax for constipation. Reports this helps a LOT with pain  PERTINENT  PMH / PSH: Prostate cancer with mets  OBJECTIVE:   BP 135/88   Pulse (!) 103   Ht 5\' 9"  (1.753 m)   Wt 132 lb (59.9 kg)   SpO2 100%   BMI 19.49 kg/m   General: Pt is sitting in exam chair, no acute distress. Cachectic. Cardiovascular: RRR, no murmurs, rubs, gallops. Pulmonary: Normal work of breathing. Lungs clear to auscultation bilaterally. Neuro/Psych: Alert and oriented to person, place, event, time. Normal affect.  ASSESSMENT/PLAN:   Housing insecurity Pt reports main concern is over housing insecurity; unsure how he will continue to afford rent through winter. Will collaborate with Nicole Cella at [employer] to work on November rent with goal of ensuring rent through the winter.  Food insecurity Pt reports food insecurity has improved after receiving groceries recently. Will continue to provide pt with groceries as needed. Will continue to work with Nicole Cella from [employer] on getting pt food stamps.  Cancer related pain Pt reports compliance with medications and denies difficulties  taking these. Does report he needs a refill of his oxycodone for cancer-related pain. - Refill of oxycodone prescribed   Governor Rooks, Medical Student [facility]  Patient seen along with MS3 student Governor Rooks. I personally evaluated this patient along with the student, and verified all aspects of the history, physical exam, and medical decision making as documented by the student. I agree with the student's documentation and have made all necessary edits.

## 2022-12-29 ENCOUNTER — Other Ambulatory Visit (HOSPITAL_COMMUNITY): Payer: Self-pay

## 2022-12-29 NOTE — Congregational Nurse Program (Signed)
  Dept: [phone]   Congregational Nurse Program Note  Date of Encounter: 12/29/2022  Past Medical History: Past Medical History:  Diagnosis Date   Back pain    Hypertension     Encounter Details:  Community Questionnaire - 12/29/22 0956       Questionnaire   Ask client: Do you give verbal consent for me to treat you today? Yes    Student Assistance [employer] Nurse    Location Patient Served  NAI    Encounter Setting CN site;Phone/Text/Email    Population Status Migrant/Refugee    Engineer, building services or [employer]    Insurance/Financial Assistance Referral N/A    Medication Have Medication Insecurities;Provided Medication Assistance;Referred to Medication Assistance    Medical Provider Yes    Screening Referrals Made N/A    Medical Referrals Made N/A    Medical Appointment Completed [facility]    CNP Interventions Advocate/Support;Navigate Healthcare System;Case Management;Counsel;Reviewed Medications;Educate    Screenings CN Performed N/A    ED Visit Averted Yes    Life-Saving Intervention Made N/A      Questionnaire   Housing/Utilities Unable to pay for utilities;Referred to housing/utility assistance program           Congregational RN contacted pharmacy and set up medication delivery by mail. Pt called and made aware to expect medication arrival by mail.  Nicole Cella Alandis Bluemel RN BSN PCCN  [facility] [phone]-cell [phone]-office

## 2022-12-30 ENCOUNTER — Encounter: Payer: Self-pay | Admitting: Hematology

## 2022-12-30 ENCOUNTER — Other Ambulatory Visit (HOSPITAL_COMMUNITY): Payer: Self-pay

## 2022-12-30 ENCOUNTER — Telehealth: Payer: Self-pay | Admitting: Pharmacist

## 2022-12-30 NOTE — Telephone Encounter (Signed)
Patient advocate/interpreter, Arman Bogus contacted for follow-up assistance with clarification of patient needs for medication supply.  She states she met with patient recently and he had access to all medications for low copay amounts with exception of Eliquis (apixaban).   She reported he had a small supply in his possession at last contact.  She did not believe the amount he had available would last until next PCP appointment with Dr. Manson Passey in November.   We discussed provision of short-term sample supply while other options including medication assistance from manufacturer are investigated.   Medication Samples have been provided for the patient to pick-up at the [facility] front desk on 10/23  Drug name: Eliquis (apixaban)       Strength: 5mg         Qty: 56 tablets (28 day supply)  LOT: [id number]  Exp.Date: 01/07/2024  Dosing instructions: 1 tablet twice daily.   The patient has been instructed regarding the correct time, dose, and frequency of taking this medication, including desired effects and most common side effects.    Total time with patient call, sample preparation and documentation of interaction: 18 minutes.   Plan to route request for support to MAP team - Siri Cole, CPhT

## 2022-12-30 NOTE — Telephone Encounter (Signed)
Patient has commercial coverage.  Eliquis copay is $75 for 30 day supply.  Will need to apply for Eliquis $10 copay card:  [email or web address]

## 2022-12-30 NOTE — Telephone Encounter (Signed)
Reviewed and agree with Dr Koval's plan.   

## 2023-01-01 ENCOUNTER — Telehealth: Payer: Self-pay | Admitting: Hematology

## 2023-01-01 ENCOUNTER — Telehealth: Payer: Self-pay | Admitting: Family Medicine

## 2023-01-01 NOTE — Congregational Nurse Program (Signed)
  Dept: [phone]   Congregational Nurse Program Note  Date of Encounter: 01/01/2023  Past Medical History: Past Medical History:  Diagnosis Date   Back pain    Hypertension     Encounter Details:  Community Questionnaire - 01/01/23 1400       Questionnaire   Ask client: Do you give verbal consent for me to treat you today? Yes    Student Assistance N/A    Location Patient Served  NAI    Encounter Setting CN site    Population Status Migrant/Refugee    Engineer, building services or [place] Insurance    Insurance/Financial Assistance Referral N/A    Medication Have Medication Insecurities;Provided Medication Assistance    Medical Provider Yes    Screening Referrals Made N/A    Medical Referrals Made N/A    Medical Appointment Completed N/A    CNP Interventions Advocate/Support;Navigate Healthcare System;Case Management;Counsel;Educate    Screenings CN Performed N/A    ED Visit Averted Yes    Life-Saving Intervention Made N/A            Transportation assistance provided and patient picked up Eliquis refill from [facility].  Nicole Cella Shay Bartoli RN BSN PCCN  [facility] [phone]-cell [phone]-office

## 2023-01-01 NOTE — Telephone Encounter (Signed)
Reviewed, completed, and signed form.  Note routed to RN team inbasket and placed completed form in RN Wall pocket in the front office.  Shadeed Colberg M Lorne Winkels, MD  

## 2023-01-01 NOTE — Telephone Encounter (Signed)
Left a message on patient's family friend contact regarding rescheduled times for 02/05/2023 appointment

## 2023-01-05 NOTE — Telephone Encounter (Signed)
Form faxed to [employer].  Copy made for batch scanning.

## 2023-01-05 NOTE — Congregational Nurse Program (Signed)
  Dept: [phone]   Congregational Nurse Program Note  Date of Encounter: 01/04/2023  Past Medical History: Past Medical History:  Diagnosis Date   Back pain    Hypertension     Encounter Details:  Community Questionnaire - 01/05/23 1325       Questionnaire   Ask client: Do you give verbal consent for me to treat you today? Yes    Student Assistance [employer] Nurse    Location Patient Served  NAI    Encounter Setting CN site    Population Status Migrant/Refugee    Engineer, building services or [place] Insurance    Insurance/Financial Assistance Referral N/A    Medication Have Medication Insecurities    Medical Provider Yes    Screening Referrals Made N/A    Medical Referrals Made N/A    Medical Appointment Completed N/A    CNP Interventions Advocate/Support;Navigate Healthcare System;Case Management;Counsel;Educate;Reviewed Medications    Screenings CN Performed Blood Pressure    ED Visit Averted Yes    Life-Saving Intervention Made N/A      Questionnaire   Housing/Utilities Unable to pay for utilities;Referred to housing/utility assistance program           Pt came in for follow up and reviewed medications. Pt is taking medications as prescribed. He continues to experience food insecurities. Pt provided with a donation of $100 for grocery shopping.  Nicole Cella Damione Robideau RN BSN PCCN  [facility] [phone]-cell [phone]-office

## 2023-01-07 ENCOUNTER — Telehealth: Payer: Self-pay | Admitting: Family Medicine

## 2023-01-07 NOTE — Telephone Encounter (Signed)
Received same [employer] form faxed to [place] 10/29. Called to ask if they had received initial form. Patient does not have any FMLA left. He needs ADA paperwork completed. Reviewed, completed, and signed form.  Note routed to RN team inbasket and placed completed form in RN Wall pocket in the front office.  [name], MD

## 2023-01-12 NOTE — Telephone Encounter (Signed)
Faxed to [employer].  Copy made for batch scanning.

## 2023-01-14 ENCOUNTER — Telehealth: Payer: Self-pay

## 2023-01-14 ENCOUNTER — Other Ambulatory Visit: Payer: Self-pay | Admitting: *Deleted

## 2023-01-14 DIAGNOSIS — C61 Malignant neoplasm of prostate: Secondary | ICD-10-CM

## 2023-01-14 NOTE — Progress Notes (Signed)
HEMATOLOGY/ONCOLOGY CLINIC NOTE  Date of Service: 01/15/2023  Patient Care Team: [name], MD as PCP - General (Family Medicine) [name], RN as Oncology Nurse Navigator  CHIEF COMPLAINTS/PURPOSE OF CONSULTATION:  Evaluation and management of newly metastatic prostate cancer  HISTORY OF PRESENTING ILLNESS:  Arthur Ali is a wonderful 62 y.o. male who is being seen for evaluation and management of likely metastatic prostate cancer based on elevated PSA levels.     Patient was previously seen by me in 2021 for lymphocytosis thought to be a reactive process. At that time, he had no signs of monoclonal lymphocytes. He was also vitamin B12 deficient at that time and his pseudothrombocytopenia had resolved.    Patient presented to the hospital with worsening dyspnea chest pain night sweats loss of appetite and unintentional weight loss and will directly admitted from the family medicine clinic.  CT of the chest done on 11/19/2022 showed no evidence of pulmonary embolism but did show pathologic thoracic adenopathy within the right paratracheal and subcarinal lymph node groups as well as widespread sclerotic metastatic disease throughout the visualized axial skeleton.  He was noted to have a 6 mm noncalcified pulmonary nodule in the right upper lobe.  Also noted to have mild central pulmonary artery enlargement with suggestions of elevated right heart pressure. He also had a x-ray of the lumbar spine which showed degenerative change of the lumbar spine and diffuse mottled appearance of the femoral heads and pelvis which could be concerning for metastatic disease.   Patient had a PSA test which showed significant elevation to nearly 1500 being highly suggestive of metastatic prostate cancer. TB QuantiFERON test is currently pending.   Patient CBC today shows normal WBC count of 10.2k with anemia with a hemoglobin of 10.5 and a platelet count of 174k. CMP shows hypocalcemia with  calcium of 7.9, decreased albumin of 1.8 and alkaline phosphatase of 500. Normal transaminases and bilirubin.  Creatinine is within normal limits at 0.76.   HIV test is nonreactive TSH was within normal limits at 1.85 Multiple myeloma panel and K/L FLC are currently pending.   Patient was seen in airborne isolation with the help of video [place] interpreter Rella Larve).  He notes that he has lost 10 to 15 kg of body weight with significantly decreased p.o. intake.  Notes decreased urinary flow about a week ago but is a little better now.  No overt hematuria.  Notes some issues with constipation. Has had diffuse bodyaches especially over the spine and pelvis.   Also notes some shortness of breath. Chest wall pain noted. Notes no new upper or lower extremity weakness, no loss of bowel or bladder control.  INTERVAL HISTORY:  Arthur Ali is a wonderful 62 y.o. male who is here for continue evaluation and management of metastatic prostate cancer. Patient was last seen by me on 12/07/2022 and was doing well overall.  Today, he is accompanied by an interpreter. Patient reports that he is only eating half as much as usual due to lack of appetite. Patient notes that he was previously ate twice a day and currently only once a day. His weight in clinic today is 130 pounds.   He denies any urinary sympoms, new back pain, or bone pain in the thigh or hips. Patient reports that his previous pain in the hips, knees, and legs have all improved. However, he continues to have lower back pain. He rated the pain 4/10. Patient reports that the back pain causes him  not to be able to have short walks in the neighborhood.   He reports that he ran out of Xtandi yesterday. Patient ran out of Oxycodone on Tuesday. He reports that he needed to use oxycodone 3 times a day previously for pain management.   Patient requests refills on Eliquis, vitamin D, Xtandi, and Oxycodone.   He denies any abdominal pain,  infection issues, fever, or chills. Patient does endorse night sweats.   He reports a rash on his face under his eyes, which are itchy. Patient notes that when he scratched the area, liquid came out.   He reports no chest pain but sometimes he has breathing issues after eating. Patient denies any breathing issues when sleeping or walking.   Patient reports that he has stopped taking stool softener as he did not need it.   He reports that he previously received a shot under his left arm in a nerve, which is now hurting.  Patient reports a new dental issue in which he experiences some discomfort when eating hard or cold foods. He denies any dental pain that is new/different otherwise.   MEDICAL HISTORY:  Past Medical History:  Diagnosis Date   Back pain    Hypertension     SURGICAL HISTORY: No past surgical history on file.  SOCIAL HISTORY: Social History   Socioeconomic History   Marital status: Married    Spouse name: Not on file   Number of children: Not on file   Years of education: Not on file   Highest education level: Not on file  Occupational History   Not on file  Tobacco Use   Smoking status: Former    Types: Cigars, Cigarettes   Smokeless tobacco: Never   Tobacco comments:    Quit in 2022.   Vaping Use   Vaping status: Never Used  Substance and Sexual Activity   Alcohol use: Never   Drug use: Never   Sexual activity: Not Currently  Other Topics Concern   Not on file  Social History Narrative   Single applicant from [place]   No family here or in [place]      Refugee Information   Number of Immediate Family Members: 0   Number of Immediate Family Members in [place]: 0   Country of Birth: [facility]   Country of Origin: [facility]   Location of Refugee Camp: [place]   Duration in [place]: 20 years or greater   Reason for Leaving Home Country: Political opinion   Primary Language: Swahili/Kiswahili, Kinyarwanda/[place]   Able to Read in Primary Language: Yes   Able to Write in  Primary Language: Yes   Education: Primary School   Prior Work: No previous jobs   Marital Status: Other (Wife and children decreased)   Sexual Activity: No   Tuberculosis Screening Overseas: Positive   Tuberculosis Screening Health Department: Not Completed   Health Department Labs Completed: Yes   History of Trauma: None   Do You Feel Jumpy or Nervous?: No   Are You Very Watchful or 'Super Alert'?: No   Social Determinants of Health   Financial Resource Strain: High Risk (12/25/2022)   Overall Financial Resource Strain (CARDIA)    Difficulty of Paying Living Expenses: Very hard  Food Insecurity: Food Insecurity Present (12/25/2022)   Hunger Vital Sign    Worried About Running Out of Food in the Last Year: Often true    Ran Out of Food in the Last Year: Sometimes true  Transportation Needs: No Transportation Needs (12/25/2022)  PRAPARE - Administrator, Civil Service (Medical): No    Lack of Transportation (Non-Medical): No  Physical Activity: Not on file  Stress: Not on file  Social Connections: Not on file  Intimate Partner Violence: Not At Risk (11/19/2022)   Humiliation, Afraid, Rape, and Kick questionnaire    Fear of Current or Ex-Partner: No    Emotionally Abused: No    Physically Abused: No    Sexually Abused: No    FAMILY HISTORY: Family History  Family history unknown: Yes    ALLERGIES:  has No Known Allergies.  MEDICATIONS:  Current Outpatient Medications  Medication Sig Dispense Refill   acetaminophen (TYLENOL) 500 MG tablet Take 2 tablets (1,000 mg total) by mouth every 8 (eight) hours as needed. 100 tablet 0   apixaban (ELIQUIS) 5 MG TABS tablet Take 1 tablet (5 mg total) by mouth 2 (two) times daily. 60 tablet 4   enzalutamide (XTANDI) 80 MG tablet Take 2 tablets (160 mg total) by mouth daily. 60 tablet 3   oxyCODONE (OXY IR/ROXICODONE) 5 MG immediate release tablet Take 1 tablet (5 mg total) by mouth every 6 (six) hours as needed for severe  pain (pain score 7-10). 30 tablet 0   polyethylene glycol powder (GLYCOLAX/MIRALAX) 17 GM/SCOOP powder Take 17 g by mouth 2 (two) times daily as needed. 3350 g 1   senna (SENOKOT) 8.6 MG TABS tablet Take 1 tablet (8.6 mg total) by mouth daily. 30 tablet 3   Vitamin D, Ergocalciferol, (DRISDOL) 1.25 MG (50000 UNIT) CAPS capsule Take 1 capsule (50,000 Units total) by mouth every 7 (seven) days. 12 capsule 0   No current facility-administered medications for this visit.    REVIEW OF SYSTEMS:    10 Point review of Systems was done is negative except as noted above.   PHYSICAL EXAMINATION: ECOG PERFORMANCE STATUS: 2 - Symptomatic, <50% confined to bed  . Vitals:   01/15/23 1318 01/15/23 1319  BP: (!) 152/102 (!) 161/98  Pulse: (!) 108   Resp: 17   Temp: 97.9 F (36.6 C)   SpO2: 99%     Filed Weights   01/15/23 1318  Weight: 130 lb 12.8 oz (59.3 kg)    .Body mass index is 19.32 kg/m.  GENERAL:alert, in no acute distress and comfortable SKIN: no acute rashes, no significant lesions EYES: conjunctiva are pink and non-injected, sclera anicteric OROPHARYNX: MMM, no exudates, no oropharyngeal erythema or ulceration NECK: supple, no JVD LYMPH:  no palpable lymphadenopathy in the cervical, axillary or inguinal regions LUNGS: clear to auscultation b/l with normal respiratory effort HEART: regular rate & rhythm ABDOMEN:  normoactive bowel sounds , non tender, not distended. Extremity: no pedal edema PSYCH: alert & oriented x 3 with fluent speech NEURO: no focal motor/sensory deficits   LABORATORY DATA:  I have reviewed the data as listed  .    Latest Ref Rng & Units 01/15/2023   12:43 PM 12/16/2022   12:42 PM 12/15/2022   12:20 PM  CBC  WBC 4.0 - 10.5 K/uL 6.9  10.1  10.1   Hemoglobin 13.0 - 17.0 g/dL 16.1  09.6  04.5   Hematocrit 39.0 - 52.0 % 36.1  38.1  39.1   Platelets 150 - 400 K/uL 179  151  99     .    Latest Ref Rng & Units 01/15/2023   12:43 PM 12/16/2022    12:42 PM 12/14/2022    4:28 PM  CMP  Glucose 70 - 99  mg/dL 540  99  79   BUN 8 - 23 mg/dL 9  27  17    Creatinine 0.61 - 1.24 mg/dL 9.81  1.91  4.78   Sodium 135 - 145 mmol/L 138  140  140   Potassium 3.5 - 5.1 mmol/L 3.8  3.9  4.2   Chloride 98 - 111 mmol/L 107  105  97   CO2 22 - 32 mmol/L 27  30  26    Calcium 8.9 - 10.3 mg/dL 9.0  9.0  8.5   Total Protein 6.5 - 8.1 g/dL 8.5  7.7  7.0   Total Bilirubin <1.2 mg/dL 0.4  0.3  <2.9   Alkaline Phos 38 - 126 U/L 393  410  520   AST 15 - 41 U/L 27  18  17    ALT 0 - 44 U/L 17  30  26     Component     Latest Ref Rng 11/20/2022 11/21/2022  Prostatic Specific Antigen     0.00 - 4.00 ng/mL 1,444.22 (H)    Vitamin D, 25-Hydroxy     30 - 100 ng/mL  33.22     Legend: (H) High  SURGICAL PATHOLOGY  CASE: [phone]  PATIENT: Arthur Ali  Surgical Pathology Report      Clinical History: sclerotic changes/infiltration, anticipated prostate  mets (cm)      FINAL MICROSCOPIC DIAGNOSIS:   A. BONE, RIGHT POSTERIOR ILIAC, BIOPSY:  -  Metastatic carcinoma, consistent with prostate origin.   Note: The biopsy of bone with extensive bone marrow fibrosis/sclerosis.  Embedded in the sclerotic stroma are extremely crushed cells without  identifiable morphology; however, the clinical impression of metastatic  prostate carcinoma is noted and confirmatory immunohistochemical stains  highlight these crushed cells of interest as positive for NKX3.1  consistent with metastatic carcinoma of prostate primary.  Dr. [place] Callas is  peer-reviewed the case and agrees with the interpretation.    RADIOGRAPHIC STUDIES: I have personally reviewed the radiological images as listed and agreed with the findings in the report. No results found.  ASSESSMENT & PLAN:   62 y.o. male from [place] who primarily speaks Kinyarwanda (very little [place]) presenting with shortness of breath, failure to thrive and generalized body pains   1,Newly diagnosed  Metastatic prostate cancer with extensive skeletal lesions and thoracic adenopathy. Stage 5.  PSA level nearly 1500.   2.  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   NO evidence of monoclonal paraproteinemia to suggestive primary bone marrow disorder.   3.  Mild lymphocytosis lymphocyte count of 5.2k previously noted to be nonclonal.  Likely from chronic inflammation/reactive.  Previously has been as high as 9.2k   4.  Findings of pulmonary hypertension on CTA--echocardiogram  with normal EF 60-65%, grade 1 diastolic dysfunction   5.  Pulmonary embolism -- possible-- on ELiquis   6.  Severe protein calorie malnutrition with weight loss of 30 to 40 pounds due to his metastatic prostate cancer.  7. Cancer related pain  PLAN:  -Discussed lab results on 01/15/2023 in detail with patient. CBC showed WBC of 10.1K, hemoglobin of 11.9, and platelets of 151K. -PSA decreased from 1500 to 250 in one month, meaning his prostate cancer has been knocked back by nearly 70%.  -there could be some scar tissue in the skin of his left arm, but not in the muscle. No signs of an overt major infection. Posibble that it may be related to cancer. Advised patient to continue  to monitor and let us  know if there is any increase in size or if it becomes painful.  -continue eliguard every 3-4 months -continue bone-strengthening medicine once a month to harden the bones and decrease the risk of fracture from tumor -He likely has a cavity causing some discomforting sensations with certain food intake.  -discussed that if there is any dental issue involving the jaw bone, there would be a role to hold bone-strengthening medication, though this would not be a concern for cavities.  -will refill Xtandi -will refill Eliquis -will refill Oxycodone -will refill Cholecalciferol -educated patient that his cancer may be suppressing his appetite -discussed that part of tolerating treatment and  fighting cancer would be to optimize p.o. intake and gain some of his weight back. Advised patient to eat as much as possible.  -okay to take stool softener and senokot as needed for constipation -I am hopeful that his back pain should improve in the next 3-6 months. However, if scans still show active disease, and patient's back pain is persistent, there may be a role for local radiation -answered all of patient's questions in detail  FOLLOW-UP: Continue monthly zometa Continue every 3 monthly Eligard RTC with Dr Candise Che with labs in 3 months  The total time spent in the appointment was 31 minutes* .  All of the patient's questions were answered with apparent satisfaction. The patient knows to call the clinic with any problems, questions or concerns.   Wyvonnia Lora MD MS AAHIVMS [facility] Hematology/Oncology Physician [facility]  .*Total Encounter Time as defined by the Centers for Medicare and Medicaid Services includes, in addition to the face-to-face time of a patient visit (documented in the note above) non-face-to-face time: obtaining and reviewing outside history, ordering and reviewing medications, tests or procedures, care coordination (communications with other health care professionals or caregivers) and documentation in the medical record.    I,Mitra Faeizi,acting as a Neurosurgeon for Wyvonnia Lora, MD.,have documented all relevant documentation on the behalf of Wyvonnia Lora, MD,as directed by  Wyvonnia Lora, MD while in the presence of Wyvonnia Lora, MD.  .I have reviewed the above documentation for accuracy and completeness, and I agree with the above. Johney Maine MD\

## 2023-01-14 NOTE — Telephone Encounter (Signed)
Patient contacted with appointment reminder.  Nicole Cella Krrish Freund RN BSN PCCN  [facility] [phone]-cell [phone]-office

## 2023-01-15 ENCOUNTER — Other Ambulatory Visit: Payer: Self-pay

## 2023-01-15 ENCOUNTER — Inpatient Hospital Stay (HOSPITAL_BASED_OUTPATIENT_CLINIC_OR_DEPARTMENT_OTHER): Payer: BC Managed Care – PPO | Admitting: Hematology

## 2023-01-15 ENCOUNTER — Inpatient Hospital Stay: Payer: BC Managed Care – PPO

## 2023-01-15 ENCOUNTER — Inpatient Hospital Stay: Payer: BC Managed Care – PPO | Attending: Hematology

## 2023-01-15 ENCOUNTER — Other Ambulatory Visit (HOSPITAL_COMMUNITY): Payer: Self-pay

## 2023-01-15 VITALS — BP 161/98 | HR 108 | Temp 97.9°F | Resp 17 | Ht 69.0 in | Wt 130.8 lb

## 2023-01-15 VITALS — BP 119/81 | HR 98 | Resp 16

## 2023-01-15 DIAGNOSIS — G893 Neoplasm related pain (acute) (chronic): Secondary | ICD-10-CM

## 2023-01-15 DIAGNOSIS — C61 Malignant neoplasm of prostate: Secondary | ICD-10-CM

## 2023-01-15 DIAGNOSIS — C7951 Secondary malignant neoplasm of bone: Secondary | ICD-10-CM

## 2023-01-15 LAB — CMP (CANCER CENTER ONLY)
ALT: 17 U/L (ref 0–44)
AST: 27 U/L (ref 15–41)
Albumin: 3.5 g/dL (ref 3.5–5.0)
Alkaline Phosphatase: 393 U/L — ABNORMAL HIGH (ref 38–126)
Anion gap: 4 — ABNORMAL LOW (ref 5–15)
BUN: 9 mg/dL (ref 8–23)
CO2: 27 mmol/L (ref 22–32)
Calcium: 9 mg/dL (ref 8.9–10.3)
Chloride: 107 mmol/L (ref 98–111)
Creatinine: 0.75 mg/dL (ref 0.61–1.24)
GFR, Estimated: >60 mL/min (ref >60)
Glucose, Bld: 118 mg/dL — ABNORMAL HIGH (ref 70–99)
Potassium: 3.8 mmol/L (ref 3.5–5.1)
Sodium: 138 mmol/L (ref 135–145)
Total Bilirubin: 0.4 mg/dL (ref <1.2)
Total Protein: 8.5 g/dL — ABNORMAL HIGH (ref 6.5–8.1)

## 2023-01-15 LAB — CBC WITH DIFFERENTIAL (CANCER CENTER ONLY)
Abs Immature Granulocytes: 0.05 10*3/uL (ref 0.00–0.07)
Basophils Absolute: 0 10*3/uL (ref 0.0–0.1)
Basophils Relative: 1 %
Eosinophils Absolute: 0.1 10*3/uL (ref 0.0–0.5)
Eosinophils Relative: 1 %
HCT: 36.1 % — ABNORMAL LOW (ref 39.0–52.0)
Hemoglobin: 11.9 g/dL — ABNORMAL LOW (ref 13.0–17.0)
Immature Granulocytes: 1 %
Lymphocytes Relative: 52 %
Lymphs Abs: 3.7 10*3/uL (ref 0.7–4.0)
MCH: 29.5 pg (ref 26.0–34.0)
MCHC: 33 g/dL (ref 30.0–36.0)
MCV: 89.6 fL (ref 80.0–100.0)
Monocytes Absolute: 0.3 10*3/uL (ref 0.1–1.0)
Monocytes Relative: 5 %
Neutro Abs: 2.8 10*3/uL (ref 1.7–7.7)
Neutrophils Relative %: 40 %
Platelet Count: 179 10*3/uL (ref 150–400)
RBC: 4.03 MIL/uL — ABNORMAL LOW (ref 4.22–5.81)
RDW: 15.9 % — ABNORMAL HIGH (ref 11.5–15.5)
WBC Count: 6.9 10*3/uL (ref 4.0–10.5)
nRBC: 0 % (ref 0.0–0.2)

## 2023-01-15 MED ORDER — APIXABAN 5 MG PO TABS
5.0000 mg | ORAL_TABLET | Freq: Two times a day (BID) | ORAL | 4 refills | Status: DC
  Filled 2023-01-15 – 2023-01-19 (×2): qty 60, 30d supply, fill #0
  Filled 2023-02-17: qty 60, 30d supply, fill #1
  Filled 2023-03-23 (×2): qty 60, 30d supply, fill #2

## 2023-01-15 MED ORDER — ENZALUTAMIDE 80 MG PO TABS: 160.0000 mg | ORAL_TABLET | Freq: Every day | ORAL | 11 refills | Status: DC

## 2023-01-15 MED ORDER — ZOLEDRONIC ACID 4 MG/100ML IV SOLN
4.0000 mg | Freq: Once | INTRAVENOUS | Status: AC
  Administered 2023-01-15: 4 mg via INTRAVENOUS
  Filled 2023-01-15: qty 100

## 2023-01-15 MED ORDER — VITAMIN D (ERGOCALCIFEROL) 1.25 MG (50000 UNIT) PO CAPS
50000.0000 [IU] | ORAL_CAPSULE | ORAL | 3 refills | Status: DC
  Filled 2023-01-15: qty 12, 84d supply, fill #0

## 2023-01-15 MED ORDER — OXYCODONE HCL 5 MG PO TABS
5.0000 mg | ORAL_TABLET | Freq: Four times a day (QID) | ORAL | 0 refills | Status: DC | PRN
  Filled 2023-01-15: qty 30, 8d supply, fill #0

## 2023-01-15 MED ORDER — SODIUM CHLORIDE 0.9 % IV SOLN: Freq: Once | INTRAVENOUS | Status: AC

## 2023-01-15 NOTE — Patient Instructions (Signed)

## 2023-01-15 NOTE — Congregational Nurse Program (Addendum)
  Dept: [phone]     Congregational Nurse Program Note  Date of Encounter: 01/15/2023  Past Medical History: Past Medical History:  Diagnosis Date   Back pain    Hypertension     Encounter Details:  Community Questionnaire - 01/15/23 1850       Questionnaire   Ask client: Do you give verbal consent for me to treat you today? Yes    Student Assistance [employer] Nurse;N/A    Location Patient Served  [facility]    Encounter Setting CN site    Population Status Migrant/Refugee    Engineer, building services or [place] Insurance    Insurance/Financial Assistance Referral N/A    Medication Have Medication Insecurities;Provided Medication Assistance;Referred to Medication Assistance;Made Appointment for Client    Medical Provider Yes    Screening Referrals Made N/A    Medical Referrals Made N/A    Medical Appointment Completed N/A    CNP Interventions Advocate/Support;Navigate Healthcare System;Case Management;Counsel;Educate;Reviewed Medications    Screenings CN Performed Blood Pressure    ED Visit Averted Yes    Life-Saving Intervention Made N/A      Questionnaire   Housing/Utilities Unable to pay for utilities;Referred to housing/utility assistance program              Encounter Details:   I have called [facility] and refilled Xtandi. It will be delivered by November 14th.  Nicole Cella Tyree Fluharty RN BSN PCCN  [facility] [phone]-cell [phone]-office

## 2023-01-18 ENCOUNTER — Telehealth: Payer: Self-pay

## 2023-01-18 NOTE — Telephone Encounter (Signed)
Patient contacted with appointment reminder, asked to bring his medication to the appointment.  Nicole Cella Breanna Shorkey RN BSN PCCN  [facility] [phone]-cell [phone]-office

## 2023-01-19 ENCOUNTER — Other Ambulatory Visit (HOSPITAL_COMMUNITY): Payer: Self-pay

## 2023-01-19 ENCOUNTER — Ambulatory Visit: Payer: BC Managed Care – PPO | Admitting: Family Medicine

## 2023-01-19 ENCOUNTER — Encounter: Payer: Self-pay | Admitting: Hematology

## 2023-01-19 VITALS — BP 141/89 | HR 95 | Ht 69.0 in | Wt 133.0 lb

## 2023-01-19 DIAGNOSIS — G893 Neoplasm related pain (acute) (chronic): Secondary | ICD-10-CM | POA: Diagnosis not present

## 2023-01-19 DIAGNOSIS — Z5941 Food insecurity: Secondary | ICD-10-CM

## 2023-01-19 DIAGNOSIS — Z59819 Housing instability, housed unspecified: Secondary | ICD-10-CM

## 2023-01-19 DIAGNOSIS — I10 Essential (primary) hypertension: Secondary | ICD-10-CM

## 2023-01-19 MED ORDER — AMLODIPINE BESYLATE 5 MG PO TABS
5.0000 mg | ORAL_TABLET | Freq: Every day | ORAL | 3 refills | Status: DC
  Filled 2023-01-19: qty 90, 90d supply, fill #0

## 2023-01-19 NOTE — Progress Notes (Signed)
Established Patient Office Visit  Subjective   Patient ID: Arthur Ali, male    DOB: 11-03-1960  Age: 62 y.o. MRN: [id number]  Chief Complaint  Patient presents with   Follow-up   Arthur Ali is a 62 y.o. male who presents today for follow-up and medication management. The patient speaks [place] as their primary language.  An interpreter was used for the entire visit.   Cancer related pain: Feels a lot of pain in his back. Has been out of oxycodone since 11/8. Needs a refill. Is not constipated. Takes senna which helps with bowel movements. Denies any falls at home. His ambulation is actually ambulating with improvement today.   SOB: Has been feeling SOB after he eats or when he wakes up at night to go to the restroom since hospital visit in September. He went because he was experiencing extreme pain all over his joints. No chest pain. No N/V.  He reports this is unchanged from prior.   Housing/Transportation insecurity: Has exhausted his savings because he is not working. Is very concerned about potential of being unhomed. Does not have a winter jacket. Congregation nurse arranged transportation to Dr. visits.  Congregational RN also helping with housing status.   Food insecurity: Lady at school use to bring him food. Last brought groceries 2 weeks ago and he has a little bit left. Has not been able to get food stamps. He has $4000 in savings and will need to use this before applying.   Medication Rec: Currently taking eliquis. Does not know if taking Xtandi because he did not bring medications to this visit.    PERTINENT  PMH / PSH: Prostate cancer with mets    Objective:     BP (!) 151/92   Pulse (!) 107   Ht 5\' 9"  (1.753 m)   Wt 133 lb (60.3 kg)   SpO2 97%   BMI 19.64 kg/m   Physical Exam Cardiovascular:     Rate and Rhythm: Normal rate and regular rhythm.     Heart sounds: No murmur heard. Pulmonary:     Effort: Pulmonary effort is normal.   Psychiatric:     Comments: Mood congruent with morose affect      Assessment & Plan:   Problem List Items Addressed This Visit   None Cancer-related pain: Uncontrolled due to being out of medication since 11/8.  - refill oxycodone, called pharmacy, will be delivered to home   SOB: Persistent dyspnea likely 2/2 cancer-related pain and progression of terminal cancer. Less likely to be a MI or PE given absence of chest pain. Will focus on adequate pain control by refilling oxycodone and reevaluate in 1 month.  Housing insecurity: Patient has significant barriers to housing security including dwindling savings and lack of social capital to provide support.  Working with agencies on this  currently.   Food insecurity: Patient's food insecurity is persistent given his financial situation. He is getting some support, and currently has a little bit of groceries left. At this visit, he was given food from our food pantry to alleviate this barrier. Will reassess at next follow up visit in 1 month.   Hypertension: BP elevated three times in clinic today. Also previously elevated.  Started on amlopidine 5mg  daily.   HCM- consider covid vaccine at next visit   Syliva Overman Medical Student    Patient seen along with MS3 student Syliva Overman. I personally evaluated this patient along with the student, and verified all aspects  of the history, physical exam, and medical decision making as documented by the student. I agree with the student's documentation and have made all necessary edits.

## 2023-01-19 NOTE — Patient Instructions (Addendum)
It was wonderful to see you today.  Please bring ALL of your medications with you to every visit.   Today we talked about:  --Your medications are being shipped to your pharmacy    -- We will monitor your blood pressure    Please follow up in 1 months   Thank you for choosing [facility].   Please call [phone] with any questions about today's appointment.  Please be sure to schedule follow up at the front  desk before you leave today.   Terisa Starr, MD  Family Medicine   Byari ibyishimo byinshi [place] uyu munsi.  Ndakwinginze ujye ujyana imiti yawe yose muri buri ruzinduko.   Twaganiriye [place]ka uyu munsi:  Imiti yawe ijyana mu buvuzi bwawe   Tuzagenzura umuvuduko w'amaraso yawe   [place] mumezi 1   [place] cyane guhitamo umuti w'indwara z'ibiyobyabwenge.   [place] [phone] hamwe n'ibibazo byose bijyanye n'ishyirwaho ry'uyu munsi.  Uyu munsi ujye [place] ku meza mbere y'uko usohoka uyu munsi.   Raymond Gurney, MD  Umuti w'umuryango

## 2023-01-20 ENCOUNTER — Other Ambulatory Visit (HOSPITAL_COMMUNITY): Payer: Self-pay

## 2023-01-20 ENCOUNTER — Other Ambulatory Visit: Payer: Self-pay

## 2023-01-20 NOTE — Congregational Nurse Program (Signed)
  Dept: [phone]   Congregational Nurse Program Note  Date of Encounter: 01/20/2023  Past Medical History: Past Medical History:  Diagnosis Date   Back pain    Hypertension     Encounter Details:  Community Questionnaire - 01/20/23 1818       Questionnaire   Ask client: Do you give verbal consent for me to treat you today? Yes    Student Assistance N/A    Location Patient Served  [facility]    Encounter Setting Home    Population Status Migrant/Refugee    Engineer, building services or [place] Insurance    Insurance/Financial Assistance Referral N/A    Medication Have Medication Insecurities;Provided Medication Assistance    Medical Provider Yes    Screening Referrals Made N/A    Medical Referrals Made N/A    Medical Appointment Completed [facility]    CNP Interventions Advocate/Support;Navigate Healthcare System;Case Management;Counsel;Educate;Reviewed Medications    Screenings CN Performed N/A    ED Visit Averted Yes    Life-Saving Intervention Made N/A      Questionnaire   Housing/Utilities Unable to pay for utilities;Referred to housing/utility assistance program            Home visit completed.  Patient experiencing food insecurity.Cooked meal of rice, beans and chicken provided.   Nicole Cella Kari Montero RN BSN PCCN  [facility] [phone]-cell [phone]-office

## 2023-01-22 ENCOUNTER — Encounter: Payer: Self-pay | Admitting: Hematology

## 2023-01-22 NOTE — Congregational Nurse Program (Signed)
  Dept: [phone]   Congregational Nurse Program Note  Date of Encounter: 01/22/2023  Past Medical History: Past Medical History:  Diagnosis Date   Back pain    Hypertension     Encounter Details:  Community Questionnaire - 01/22/23 1605       Questionnaire   Ask client: Do you give verbal consent for me to treat you today? Yes    Student Assistance N/A    Location Patient Served  [facility]    Encounter Setting Phone/Text/Email    Population Status Migrant/Refugee    Engineer, building services or [place] Insurance    Insurance/Financial Assistance Referral N/A    Medication Have Medication Insecurities;Provided Medication Assistance;Referred to Medication Assistance    Medical Provider Yes    Screening Referrals Made N/A    Medical Referrals Made N/A    Medical Appointment Completed [facility] P[facility]    CNP Interventions Advocate/Support;Navigate Healthcare System;Case Management;Counsel;Educate;Reviewed Medications    Screenings CN Performed N/A    ED Visit Averted Yes    Life-Saving Intervention Made N/A      Questionnaire   Housing/Utilities Unable to pay for utilities;Referred to housing/utility assistance program           Patient called me with questions regarding medication. Medication was delivered yesterday.Education provided through a whatapp video call. Patient verbalized understanding on how to take each medication with teach back. I will continue to assists as needed.  Nicole Cella Ritta Hammes RN BSN PCCN  [facility] [phone] [phone]-office

## 2023-02-02 ENCOUNTER — Other Ambulatory Visit: Payer: Self-pay

## 2023-02-02 ENCOUNTER — Other Ambulatory Visit (HOSPITAL_COMMUNITY): Payer: Self-pay

## 2023-02-02 ENCOUNTER — Other Ambulatory Visit: Payer: Self-pay | Admitting: Family Medicine

## 2023-02-02 DIAGNOSIS — G893 Neoplasm related pain (acute) (chronic): Secondary | ICD-10-CM

## 2023-02-02 MED ORDER — OXYCODONE HCL 5 MG PO TABS
5.0000 mg | ORAL_TABLET | Freq: Four times a day (QID) | ORAL | 0 refills | Status: DC | PRN
  Filled 2023-02-02: qty 60, 15d supply, fill #0

## 2023-02-02 NOTE — Progress Notes (Unsigned)
Received RN message patient having uncontrolled cancer pain.

## 2023-02-02 NOTE — Congregational Nurse Program (Signed)
  Dept: [phone]   Congregational Nurse Program Note  Date of Encounter: 02/02/2023  Past Medical History: Past Medical History:  Diagnosis Date   Back pain    Hypertension     Encounter Details:  Community Questionnaire - 02/02/23 1300       Questionnaire   Ask client: Do you give verbal consent for me to treat you today? Yes    Student Assistance N/A    Location Patient Served  [facility]    Encounter Setting Home    Population Status Migrant/Refugee    Engineer, building services or [place] Insurance    Insurance/Financial Assistance Referral N/A    Medication Have Medication Insecurities;Provided Medication Assistance    Medical Provider Yes    Screening Referrals Made N/A    Medical Referrals Made N/A    Medical Appointment Completed [facility]    CNP Interventions Advocate/Support;Navigate Healthcare System;Case Management;Counsel;Educate;Reviewed Medications    Screenings CN Performed N/A    ED Visit Averted Yes    Life-Saving Intervention Made N/A      Questionnaire   Housing/Utilities Unable to pay for utilities;Referred to housing/utility assistance program            Home visit done for food delivery. Cooked rice,turkey and apple pie delivered, a donation from [employer].Noted that patient was in a lot of pain rated 10/10 to lower extremities, back and chest.Has run out of Oxycodone.Contacted [employer], no refills available. Dr Manson Passey informed and refill completed.  I have confirmed that medication is out for delivery. Patient made aware to expect delivery soon.  Nicole Cella Le Ferraz RN BSN PCCN  [facility] [phone] [phone]-office

## 2023-02-03 ENCOUNTER — Other Ambulatory Visit: Payer: Self-pay

## 2023-02-03 DIAGNOSIS — C61 Malignant neoplasm of prostate: Secondary | ICD-10-CM

## 2023-02-05 ENCOUNTER — Other Ambulatory Visit: Payer: Self-pay

## 2023-02-05 ENCOUNTER — Other Ambulatory Visit: Payer: BC Managed Care – PPO

## 2023-02-05 ENCOUNTER — Ambulatory Visit: Payer: BC Managed Care – PPO

## 2023-02-05 ENCOUNTER — Inpatient Hospital Stay: Payer: BC Managed Care – PPO

## 2023-02-05 VITALS — BP 139/94 | HR 78 | Temp 97.7°F | Resp 16

## 2023-02-05 DIAGNOSIS — C61 Malignant neoplasm of prostate: Secondary | ICD-10-CM | POA: Diagnosis not present

## 2023-02-05 LAB — CBC WITH DIFFERENTIAL (CANCER CENTER ONLY)
Abs Immature Granulocytes: 0.04 10*3/uL (ref 0.00–0.07)
Basophils Absolute: 0 10*3/uL (ref 0.0–0.1)
Basophils Relative: 1 %
Eosinophils Absolute: 0.2 10*3/uL (ref 0.0–0.5)
Eosinophils Relative: 3 %
HCT: 38.7 % — ABNORMAL LOW (ref 39.0–52.0)
Hemoglobin: 12.6 g/dL — ABNORMAL LOW (ref 13.0–17.0)
Immature Granulocytes: 1 %
Lymphocytes Relative: 64 %
Lymphs Abs: 4.2 10*3/uL — ABNORMAL HIGH (ref 0.7–4.0)
MCH: 29.2 pg (ref 26.0–34.0)
MCHC: 32.6 g/dL (ref 30.0–36.0)
MCV: 89.8 fL (ref 80.0–100.0)
Monocytes Absolute: 0.3 10*3/uL (ref 0.1–1.0)
Monocytes Relative: 5 %
Neutro Abs: 1.7 10*3/uL (ref 1.7–7.7)
Neutrophils Relative %: 26 %
Platelet Count: 169 10*3/uL (ref 150–400)
RBC: 4.31 MIL/uL (ref 4.22–5.81)
RDW: 14.5 % (ref 11.5–15.5)
WBC Count: 6.5 10*3/uL (ref 4.0–10.5)
nRBC: 0 % (ref 0.0–0.2)

## 2023-02-05 LAB — CMP (CANCER CENTER ONLY)
ALT: 8 U/L (ref 0–44)
AST: 19 U/L (ref 15–41)
Albumin: 3.6 g/dL (ref 3.5–5.0)
Alkaline Phosphatase: 314 U/L — ABNORMAL HIGH (ref 38–126)
Anion gap: 3 — ABNORMAL LOW (ref 5–15)
BUN: 10 mg/dL (ref 8–23)
CO2: 30 mmol/L (ref 22–32)
Calcium: 9.1 mg/dL (ref 8.9–10.3)
Chloride: 104 mmol/L (ref 98–111)
Creatinine: 0.64 mg/dL (ref 0.61–1.24)
GFR, Estimated: >60 mL/min (ref >60)
Glucose, Bld: 91 mg/dL (ref 70–99)
Potassium: 4.1 mmol/L (ref 3.5–5.1)
Sodium: 137 mmol/L (ref 135–145)
Total Bilirubin: 0.4 mg/dL (ref <1.2)
Total Protein: 8.5 g/dL — ABNORMAL HIGH (ref 6.5–8.1)

## 2023-02-05 MED ORDER — ZOLEDRONIC ACID 4 MG/100ML IV SOLN
4.0000 mg | Freq: Once | INTRAVENOUS | Status: AC
  Administered 2023-02-05: 4 mg via INTRAVENOUS
  Filled 2023-02-05: qty 100

## 2023-02-05 MED ORDER — SODIUM CHLORIDE 0.9 % IV SOLN: Freq: Once | INTRAVENOUS | Status: AC

## 2023-02-05 NOTE — Progress Notes (Signed)
Patient here a little early for his zometa- per his labs- Scr and calcium appropriate. Scr 0.64 and corrected Ca is 9.42. Discussed with RPH- Anola Gurney. No denatl problems except some pain in a tooth. Through interpretation- patient states he saw a dentist and it should be ok.

## 2023-02-05 NOTE — Congregational Nurse Program (Signed)
  Dept: [phone]   Congregational Nurse Program Note  Date of Encounter: 02/05/2023  Past Medical History: Past Medical History:  Diagnosis Date   Back pain    Hypertension     Encounter Details:  Community Questionnaire - 02/05/23 1324       Questionnaire   Ask client: Do you give verbal consent for me to treat you today? Yes    Student Assistance N/A    Location Patient Served  [facility]    Encounter Setting CN site;Phone/Text/Email    Population Status Migrant/Refugee    Engineer, building services or [employer]    Insurance/Financial Assistance Referral N/A    Medication Have Medication Insecurities;Provided Medication Assistance    Medical Provider Yes    Screening Referrals Made N/A    Medical Referrals Made N/A    Medical Appointment Completed [facility]    CNP Interventions Advocate/Support;Navigate Healthcare System;Case Management;Counsel;Educate;Reviewed Medications    Screenings CN Performed N/A    ED Visit Averted Yes    Life-Saving Intervention Made N/A      Questionnaire   Housing/Utilities Unable to pay for utilities;Referred to housing/utility assistance program             Transportation provided by [employer] and community health program for today`s appointment with Cancer center for infusion.  Nicole Cella Zeph Riebel RN BSN PCCN  [facility] [phone] [phone]

## 2023-02-05 NOTE — Patient Instructions (Signed)

## 2023-02-09 NOTE — Congregational Nurse Program (Signed)
  Dept: [phone]   Congregational Nurse Program Note  Date of Encounter: 02/09/2023  Past Medical History: Past Medical History:  Diagnosis Date   Back pain    Hypertension     Encounter Details:  Community Questionnaire - 02/09/23 1420       Questionnaire   Ask client: Do you give verbal consent for me to treat you today? Yes    Student Assistance N/A    Location Patient Served  [facility]    Encounter Setting Home    Population Status Migrant/Refugee    Engineer, building services or [place] Insurance    Insurance/Financial Assistance Referral N/A    Medication Have Medication Insecurities;Provided Medication Assistance;Referred to Medication Assistance    Medical Provider Yes    Screening Referrals Made N/A    Medical Referrals Made N/A    Medical Appointment Completed [facility]    CNP Interventions Advocate/Support;Navigate Healthcare System;Case Management;Counsel;Educate;Reviewed Medications    Screenings CN Performed N/A    ED Visit Averted Yes    Life-Saving Intervention Made N/A      Questionnaire   Housing/Utilities Unable to pay for utilities;Referred to housing/utility assistance program            02/08/23- 0900 am  Food donation delivered to patient home.  Nicole Cella Shaneya Taketa RN BSN PCCN  [facility] [phone] [phone]

## 2023-02-11 ENCOUNTER — Other Ambulatory Visit: Payer: Self-pay

## 2023-02-11 DIAGNOSIS — C61 Malignant neoplasm of prostate: Secondary | ICD-10-CM

## 2023-02-11 MED ORDER — ENZALUTAMIDE 80 MG PO TABS
160.0000 mg | ORAL_TABLET | Freq: Every day | ORAL | 11 refills | Status: DC
Start: 2023-02-11 — End: 2023-04-30

## 2023-02-16 ENCOUNTER — Other Ambulatory Visit (HOSPITAL_COMMUNITY): Payer: Self-pay

## 2023-02-16 ENCOUNTER — Ambulatory Visit (INDEPENDENT_AMBULATORY_CARE_PROVIDER_SITE_OTHER): Payer: BC Managed Care – PPO | Admitting: Family Medicine

## 2023-02-16 VITALS — BP 134/72 | HR 105 | Ht 69.0 in | Wt 133.6 lb

## 2023-02-16 DIAGNOSIS — Z23 Encounter for immunization: Secondary | ICD-10-CM | POA: Diagnosis not present

## 2023-02-16 DIAGNOSIS — G893 Neoplasm related pain (acute) (chronic): Secondary | ICD-10-CM

## 2023-02-16 DIAGNOSIS — Z59819 Housing instability, housed unspecified: Secondary | ICD-10-CM | POA: Diagnosis not present

## 2023-02-16 DIAGNOSIS — I10 Essential (primary) hypertension: Secondary | ICD-10-CM | POA: Diagnosis not present

## 2023-02-16 DIAGNOSIS — Z5941 Food insecurity: Secondary | ICD-10-CM

## 2023-02-16 MED ORDER — OXYCODONE HCL 5 MG PO TABS
5.0000 mg | ORAL_TABLET | Freq: Three times a day (TID) | ORAL | 0 refills | Status: DC | PRN
  Filled 2023-03-01: qty 60, 20d supply, fill #0

## 2023-02-16 NOTE — Patient Instructions (Addendum)
It was wonderful to see you today.  Please bring ALL of your medications with you to every visit.   Today we talked about:  --We will see if we can reduce the cost of your imaging    --- I sent in your pain pill to pick up the end of the month  Please keep taking your blood pressure pills  Please follow up in 1 months   Thank you for choosing [facility].   Please call [phone] with any questions about today's appointment.  Please be sure to schedule follow up at the front  desk before you leave today.   Terisa Starr, MD  Family Medicine    Byari byiza Elam Dutch uyu munsi.  [place] uzane imiti yawe yose muri buri nama.  Uyu munsi twaganiriye Jesusita Oka:  --Rupert Stacks niba dushobora kugabanya igiciro cyo gusuzuma.  ---[place]hereje imiti y'uburibwe ngo uyifate ku mpera z'ukwezi.  [place]un[place]a Jesusita Oka jya ukomeza gufata imiti yawe y'igitutu cy'amaraso. [place] ukore [place] yo kugaruka mu kwezi kimwe.  Candida Peeling [facility].  Nyamun[place]a hamagara [phone] niba ufite ibibazo ku bijyanye n'iyi nama.  Nyamun[place]a wibuke [place] yo [place] ku meza y'imbere [place] yo [place] uyu munsi.  Terisa Starr, MD [employer] bw'umuryango

## 2023-02-16 NOTE — Congregational Nurse Program (Signed)
  Dept: [phone]   Congregational Nurse Program Note  Date of Encounter: 02/16/2023  Past Medical History: Past Medical History:  Diagnosis Date   Back pain    Hypertension     Encounter Details:  Community Questionnaire - 02/16/23 1342       Questionnaire   Ask client: Do you give verbal consent for me to treat you today? Yes    Student Assistance N/A    Location Patient Served  [facility]    Encounter Setting CN site;Phone/Text/Email    Population Status Migrant/Refugee    Engineer, building services or [employer]    Insurance/Financial Assistance Referral N/A    Medication Have Medication Insecurities;Provided Medication Assistance    Medical Provider Yes    Screening Referrals Made N/A    Medical Referrals Made N/A    Medical Appointment Completed [facility]    CNP Interventions Advocate/Support;Navigate Healthcare System;Case Management;Counsel;Educate;Reviewed Medications    Screenings CN Performed N/A    ED Visit Averted Yes    Life-Saving Intervention Made N/A      Questionnaire   Housing/Utilities Unable to pay for utilities;Referred to housing/utility assistance program            Patient called with appointment reminder.Transportation assistance provided.  Nicole Cella Capricia Serda RN BSN PCCN  [facility] [phone]-office

## 2023-02-16 NOTE — Assessment & Plan Note (Signed)
Suspect as cause of R shoulder pain No signs of shingles or new fracture Oxycodone refilled

## 2023-02-16 NOTE — Assessment & Plan Note (Signed)
Discussed options, will likely need to consider roommate

## 2023-02-16 NOTE — Progress Notes (Signed)
    SUBJECTIVE:   CHIEF COMPLAINT: back and shoulder pain HPI:   Arthur Ali is a 62 y.o.  with history notable for HTN, prostate cancer presenting for follow up.  He has several concerns. Has abotu $3000 in savings. Now has bill from [facility] for 905$. He is having difficulty navigating the phone tree. Also has a 36$ bill from [employer]. He is out of food, reports Nicole Cella will bring him some today.  He is taking his oxycodone TID or less. No constipation. No falls. Has severe R shoulder pain for several months with ROM overhead. Oxycodone helps. He wonders what it is from.  HTN Taking Amlodipine as prescribed. No CP or dyspnea changes. No headaches or falls.    PERTINENT  PMH / PSH/Family/Social History : suspected PE on Eliquis, metastatic prostate cancer with imaging showing metastasis to R humerus   OBJECTIVE:   BP 134/72   Pulse (!) 105   Ht 5\' 9"  (1.753 m)   Wt 133 lb 9.6 oz (60.6 kg)   SpO2 95%   BMI 19.73 kg/m   Today's weight:  Last Weight  Most recent update: 02/16/2023  1:59 PM    Weight  60.6 kg (133 lb 9.6 oz)            Review of prior weights: Filed Weights   02/16/23 1359  Weight: 133 lb 9.6 oz (60.6 kg)    RRR--slightly tachycardic but improved Lungs clear No rash or TPP of R shoulder  + pain with abduction and forward flexion   ASSESSMENT/PLAN:   Assessment & Plan Essential hypertension At goal, continue current medication  Cancer related pain Suspect as cause of R shoulder pain No signs of shingles or new fracture Oxycodone refilled  Encounter for immunization COVID today  Housing insecurity Discussed options, will likely need to consider roommate  Food insecurity Food given today    Called and spoke with Dorothy--will come to [facility] next week to help set up payment plans    Terisa Starr, MD  Family Medicine Teaching Service  [facility]

## 2023-02-16 NOTE — Assessment & Plan Note (Signed)
Food given today

## 2023-02-16 NOTE — Assessment & Plan Note (Signed)
-   At goal, continue current medication

## 2023-02-17 ENCOUNTER — Other Ambulatory Visit (HOSPITAL_COMMUNITY): Payer: Self-pay

## 2023-02-17 NOTE — Congregational Nurse Program (Signed)
  Dept: [phone]   Congregational Nurse Program Note  Date of Encounter: 02/17/2023  Past Medical History: Past Medical History:  Diagnosis Date   Back pain    Hypertension     Encounter Details:  Community Questionnaire - 02/17/23 1306       Questionnaire   Ask client: Do you give verbal consent for me to treat you today? Yes    Student Assistance N/A    Location Patient Served  [facility]    Encounter Setting Home    Population Status Migrant/Refugee    Engineer, building services or [place] Insurance    Insurance/Financial Assistance Referral N/A    Medication Have Medication Insecurities;Provided Medication Assistance;Referred to Medication Assistance    Medical Provider Yes    Screening Referrals Made N/A    Medical Referrals Made N/A    Medical Appointment Completed [facility]    CNP Interventions Advocate/Support;Navigate Healthcare System;Case Management;Counsel;Educate;Reviewed Medications    Screenings CN Performed N/A    ED Visit Averted Yes    Life-Saving Intervention Made N/A      Questionnaire   Housing/Utilities Unable to pay for utilities;Referred to housing/utility assistance program           Patient brought in his medical bill requesting assistance to set up monthly payment for his medical bill. Monthly payment set up at $120/month  and patient was in agreement for a period of 6 months.  Patient also brought his medication for education and review. He is taking medication as advised.I will call for refills today   Case manager also will assist patient file for SSI and food stump in January. He will turn 62 years old then. He will also have less than $2000in his savings account.

## 2023-02-19 NOTE — Congregational Nurse Program (Signed)
  Dept: [phone]   Congregational Nurse Program Note  Date of Encounter: 02/19/2023  Past Medical History: Past Medical History:  Diagnosis Date   Back pain    Hypertension     Encounter Details:  I have called [facility] and refilled Xtandi.  Nicole Cella Trevelle Mcgurn RN BSN PCCN  [facility] [phone] [phone]-office

## 2023-02-24 ENCOUNTER — Other Ambulatory Visit (HOSPITAL_COMMUNITY): Payer: Self-pay

## 2023-03-01 ENCOUNTER — Other Ambulatory Visit: Payer: Self-pay

## 2023-03-01 ENCOUNTER — Other Ambulatory Visit (HOSPITAL_COMMUNITY): Payer: Self-pay

## 2023-03-01 NOTE — Congregational Nurse Program (Signed)
  Dept: [phone]   Congregational Nurse Program Note  Date of Encounter: 03/01/2023  Past Medical History: Past Medical History:  Diagnosis Date   Back pain    Hypertension     Encounter Details:  Community Questionnaire - 03/01/23 0902       Questionnaire   Ask client: Do you give verbal consent for me to treat you today? N/A    Student Assistance N/A    Location Patient Served  [facility]    Encounter Setting Phone/Text/Email    Population Status Migrant/Refugee    Engineer, building services or [place] Insurance    Insurance/Financial Assistance Referral N/A    Medication Have Medication Insecurities;Provided Medication Assistance;Patient Medications Reviewed    Medical Provider Yes    Screening Referrals Made N/A    Medical Referrals Made N/A    Medical Appointment Completed [facility]    CNP Interventions Advocate/Support;Navigate Healthcare System;Counsel;Case Management;Educate    Screenings CN Performed N/A    ED Visit Averted Yes    Life-Saving Intervention Made N/A      Questionnaire   Housing/Utilities Unable to pay for utilities;Referred to housing/utility assistance program            I have called [place] pharmacy to refill Oxycodone. Patient made aware to expect medication.  Nicole Cella Schwanda Zima RN BSN PCCN  [facility] [phone] [phone]-office

## 2023-03-02 NOTE — Congregational Nurse Program (Signed)
  Dept: [phone]   Congregational Nurse Program Note  Date of Encounter: 03/02/2023  Past Medical History: Past Medical History:  Diagnosis Date   Back pain    Hypertension     Encounter Details:  Community Questionnaire - 03/01/23 1000       Questionnaire   Ask client: Do you give verbal consent for me to treat you today? N/A    Student Assistance N/A    Location Patient Served  [facility]    Encounter Setting Phone/Text/Email    Population Status Migrant/Refugee    Engineer, building services or [place] Insurance    Insurance/Financial Assistance Referral N/A    Medication Have Medication Insecurities;Provided Medication Assistance;Patient Medications Reviewed    Medical Provider Yes    Screening Referrals Made N/A    Medical Referrals Made N/A    Medical Appointment Completed [facility]    CNP Interventions Advocate/Support;Navigate Healthcare System;Counsel;Case Management;Educate    Screenings CN Performed N/A    ED Visit Averted Yes    Life-Saving Intervention Made N/A      Questionnaire   Housing/Utilities Unable to pay for utilities;Referred to housing/utility assistance program            03-01-23 Home visit done. Cooked meals provided from a donation. Reviewed medications with patient. Old empty pill bottles appropriately discarded to avoid confusion.Reviewed up coming appointments with patient. Patient very appreciative of the services and assistance provided.  Nicole Cella Veeda Virgo RN BSN PCCN  [facility] [phone]-office

## 2023-03-04 ENCOUNTER — Other Ambulatory Visit: Payer: Self-pay | Admitting: *Deleted

## 2023-03-04 ENCOUNTER — Telehealth: Payer: Self-pay

## 2023-03-04 DIAGNOSIS — C61 Malignant neoplasm of prostate: Secondary | ICD-10-CM

## 2023-03-04 NOTE — Telephone Encounter (Signed)
I have called patient with appointment reminder. Transportation assistance will be provided.  Nicole Cella Yunuen Mordan RN BSN PCCN  [facility] [phone] [phone]-office

## 2023-03-05 ENCOUNTER — Other Ambulatory Visit: Payer: Self-pay

## 2023-03-05 ENCOUNTER — Inpatient Hospital Stay: Payer: BC Managed Care – PPO | Attending: Hematology

## 2023-03-05 ENCOUNTER — Inpatient Hospital Stay: Payer: BC Managed Care – PPO

## 2023-03-05 ENCOUNTER — Ambulatory Visit: Payer: BC Managed Care – PPO

## 2023-03-05 VITALS — BP 134/99 | HR 90 | Temp 97.8°F | Resp 16 | Wt 130.5 lb

## 2023-03-05 DIAGNOSIS — R972 Elevated prostate specific antigen [PSA]: Secondary | ICD-10-CM | POA: Insufficient documentation

## 2023-03-05 DIAGNOSIS — C61 Malignant neoplasm of prostate: Secondary | ICD-10-CM | POA: Diagnosis not present

## 2023-03-05 DIAGNOSIS — Z5111 Encounter for antineoplastic chemotherapy: Secondary | ICD-10-CM | POA: Diagnosis present

## 2023-03-05 DIAGNOSIS — C7951 Secondary malignant neoplasm of bone: Secondary | ICD-10-CM | POA: Insufficient documentation

## 2023-03-05 LAB — CBC WITH DIFFERENTIAL (CANCER CENTER ONLY)
Abs Immature Granulocytes: 0.04 10*3/uL (ref 0.00–0.07)
Basophils Absolute: 0 10*3/uL (ref 0.0–0.1)
Basophils Relative: 0 %
Eosinophils Absolute: 0.2 10*3/uL (ref 0.0–0.5)
Eosinophils Relative: 4 %
HCT: 41.1 % (ref 39.0–52.0)
Hemoglobin: 13.6 g/dL (ref 13.0–17.0)
Immature Granulocytes: 1 %
Lymphocytes Relative: 54 %
Lymphs Abs: 3.4 10*3/uL (ref 0.7–4.0)
MCH: 28.7 pg (ref 26.0–34.0)
MCHC: 33.1 g/dL (ref 30.0–36.0)
MCV: 86.7 fL (ref 80.0–100.0)
Monocytes Absolute: 0.2 10*3/uL (ref 0.1–1.0)
Monocytes Relative: 4 %
Neutro Abs: 2.3 10*3/uL (ref 1.7–7.7)
Neutrophils Relative %: 37 %
Platelet Count: 181 10*3/uL (ref 150–400)
RBC: 4.74 MIL/uL (ref 4.22–5.81)
RDW: 13.5 % (ref 11.5–15.5)
WBC Count: 6.2 10*3/uL (ref 4.0–10.5)
nRBC: 0 % (ref 0.0–0.2)

## 2023-03-05 LAB — CMP (CANCER CENTER ONLY)
ALT: 5 U/L (ref 0–44)
AST: 20 U/L (ref 15–41)
Albumin: 3.6 g/dL (ref 3.5–5.0)
Alkaline Phosphatase: 157 U/L — ABNORMAL HIGH (ref 38–126)
Anion gap: 5 (ref 5–15)
BUN: 14 mg/dL (ref 8–23)
CO2: 30 mmol/L (ref 22–32)
Calcium: 9.5 mg/dL (ref 8.9–10.3)
Chloride: 106 mmol/L (ref 98–111)
Creatinine: 0.7 mg/dL (ref 0.61–1.24)
GFR, Estimated: >60 mL/min (ref >60)
Glucose, Bld: 102 mg/dL — ABNORMAL HIGH (ref 70–99)
Potassium: 3.8 mmol/L (ref 3.5–5.1)
Sodium: 141 mmol/L (ref 135–145)
Total Bilirubin: 0.3 mg/dL (ref <1.2)
Total Protein: 9 g/dL — ABNORMAL HIGH (ref 6.5–8.1)

## 2023-03-05 MED ORDER — LEUPROLIDE ACETATE (3 MONTH) 22.5 MG ~~LOC~~ KIT
22.5000 mg | PACK | Freq: Once | SUBCUTANEOUS | Status: AC
Start: 2023-03-05 — End: 2023-03-05
  Administered 2023-03-05: 22.5 mg via SUBCUTANEOUS
  Filled 2023-03-05: qty 22.5

## 2023-03-05 NOTE — Patient Instructions (Signed)

## 2023-03-05 NOTE — Progress Notes (Signed)
Patient here for eligard and zometa- eligard to be given. After discussion with the patient through the intrepreter and he has a "sick tooth. He says it is ok, but that he does have intermittent pain. He has not seen a dentist. I explained he needed to see a dentist or be cleared by Dr. Candise Che. Patient aware. Discussed also with Devan RPH who is in agreement with this nurse's decision.

## 2023-03-06 LAB — PSA, TOTAL AND FREE
PSA, Free Pct: 62.7 %
PSA, Free: 5.39 ng/mL
Prostate Specific Ag, Serum: 8.6 ng/mL — ABNORMAL HIGH (ref 0.0–4.0)

## 2023-03-19 NOTE — Congregational Nurse Program (Signed)
  Dept: [phone]   Congregational Nurse Program Note  Date of Encounter: 03/19/2023  Past Medical History: Past Medical History:  Diagnosis Date   Back pain    Hypertension     Encounter Details:  Community Questionnaire - 03/19/23 1651       Questionnaire   Ask client: Do you give verbal consent for me to treat you today? N/A    Student Assistance N/A    Location Patient Served  [facility]    Encounter Setting Home    Population Status Migrant/Refugee    Engineer, Building Services or [place] Insurance    Insurance/Financial Assistance Referral N/A    Medication Have Medication Insecurities;Provided Medication Assistance;Patient Medications Reviewed    Medical Provider Yes    Screening Referrals Made N/A    Medical Referrals Made N/A    Medical Appointment Completed [facility] PCP/Clinic    CNP Interventions Advocate/Support;Navigate Healthcare System;Counsel;Case Management;Educate    Screenings CN Performed N/A    ED Visit Averted Yes    Life-Saving Intervention Made N/A      Questionnaire   Housing/Utilities Unable to pay for utilities;Referred to housing/utility assistance program           A pot of home made soup delivered to his home. Assisted  to sign paperwork for SSI and SNAP.  Naomie Emmilynn Marut RN BSN PCCN  [facility] [phone]-cell [phone]

## 2023-03-23 ENCOUNTER — Encounter: Payer: Self-pay | Admitting: Hematology

## 2023-03-23 ENCOUNTER — Other Ambulatory Visit (HOSPITAL_COMMUNITY): Payer: Self-pay

## 2023-03-23 ENCOUNTER — Telehealth: Payer: Self-pay | Admitting: Family Medicine

## 2023-03-23 ENCOUNTER — Other Ambulatory Visit: Payer: Self-pay

## 2023-03-23 ENCOUNTER — Ambulatory Visit: Payer: BC Managed Care – PPO

## 2023-03-23 ENCOUNTER — Telehealth: Payer: Self-pay

## 2023-03-23 ENCOUNTER — Ambulatory Visit (INDEPENDENT_AMBULATORY_CARE_PROVIDER_SITE_OTHER): Payer: BC Managed Care – PPO | Admitting: Family Medicine

## 2023-03-23 ENCOUNTER — Other Ambulatory Visit: Payer: Self-pay | Admitting: Family Medicine

## 2023-03-23 ENCOUNTER — Ambulatory Visit (HOSPITAL_COMMUNITY)
Admission: RE | Admit: 2023-03-23 | Discharge: 2023-03-23 | Disposition: A | Discharge status: Home / Self Care | Payer: BC Managed Care – PPO | Source: Ambulatory Visit | Attending: Family Medicine | Admitting: Family Medicine

## 2023-03-23 VITALS — BP 143/100 | HR 82 | Ht 69.0 in | Wt 131.4 lb

## 2023-03-23 DIAGNOSIS — C7951 Secondary malignant neoplasm of bone: Secondary | ICD-10-CM

## 2023-03-23 DIAGNOSIS — G893 Neoplasm related pain (acute) (chronic): Secondary | ICD-10-CM | POA: Diagnosis not present

## 2023-03-23 DIAGNOSIS — M25521 Pain in right elbow: Secondary | ICD-10-CM | POA: Diagnosis not present

## 2023-03-23 DIAGNOSIS — C61 Malignant neoplasm of prostate: Secondary | ICD-10-CM | POA: Insufficient documentation

## 2023-03-23 DIAGNOSIS — I10 Essential (primary) hypertension: Secondary | ICD-10-CM

## 2023-03-23 MED ORDER — OXYCODONE HCL 5 MG PO TABS
5.0000 mg | ORAL_TABLET | Freq: Three times a day (TID) | ORAL | 0 refills | Status: DC | PRN
  Filled 2023-03-23: qty 60, 20d supply, fill #0

## 2023-03-23 MED ORDER — SENNA 8.6 MG PO TABS
1.0000 | ORAL_TABLET | Freq: Every day | ORAL | 3 refills | Status: DC
  Filled 2023-03-23: qty 100, 100d supply, fill #0
  Filled 2023-03-23: qty 30, 30d supply, fill #0

## 2023-03-23 MED ORDER — AMLODIPINE BESYLATE 10 MG PO TABS: 10.0000 mg | ORAL_TABLET | Freq: Every day | ORAL | 1 refills | Status: DC

## 2023-03-23 MED ORDER — AMLODIPINE BESYLATE 10 MG PO TABS
10.0000 mg | ORAL_TABLET | Freq: Every day | ORAL | 1 refills | Status: DC
  Filled 2023-03-23 – 2023-03-30 (×2): qty 30, 30d supply, fill #0

## 2023-03-23 NOTE — Assessment & Plan Note (Signed)
 Patient was started on amlodipine 2 months prior. BP elevated today with repeat. Will increase amlodipine to 10 mg daily. - Amlodipine 10 mg daily - F/U one month

## 2023-03-23 NOTE — Assessment & Plan Note (Addendum)
 Pain to lateral epicondyle noted after COVID injection ~1 month ago. Injection site still swollen without pain. Pain improving. Etiology unclear, though no alarming symptoms including numbness, tingling, signs of infection. Patient does have signs of post injection reaction at injection site, may be related to elbow pain. Given patients history of metastatic cancer, will order xray to rule out new malignancy. Patient to see oncology 1/24 - R elbow xray, will discuss with case manager  - continue hot compress to injection site - follow up 1 month

## 2023-03-23 NOTE — Telephone Encounter (Signed)
Patient came to app.

## 2023-03-23 NOTE — Progress Notes (Signed)
    SUBJECTIVE:   CHIEF COMPLAINT / HPI:   Patient presents for right elbow pain that started after getting COVID shot during last visit (12/10). Reports pain is limited to lateral side of elbow and in the bone. No trauma to the area. Does not have associated swelling or redness. The pain does not travel and he has not had any decreased strength or sensation to the right side. He also has some swelling and a bump at the site of injection. He thinks this is getting better along with the elbow pain. Has not noted any drainage from injection site.  Denies any headaches or vision changes. Denies any shortness of breath or trouble breathing. Denies fevers/chills and any sick symptoms. Reports taking his blood pressure medication this AM.   PERTINENT  PMH / PSH:  Metastatic prostate cancer HTN  OBJECTIVE:   BP (!) 143/100   Pulse 82   Ht 5' 9 (1.753 m)   Wt 131 lb 6.4 oz (59.6 kg)   SpO2 100%   BMI 19.40 kg/m   General: elderly male, thin, NAD HEENT: No sign of trauma Cardiac: RRR Respiratory: CTAB MSK: no swelling, erythema noted at right elbow. Pain with palpation of lateral epicondyle. Strength 5/5. Full ROM Sensation intact.  Skin: swelling at left upper arm at injection site, 1cmx1cm nodule noted under skin at injection site. No pain with palpation. No drainage noted. No erythema.    ASSESSMENT/PLAN:   Essential hypertension Patient was started on amlodipine  2 months prior. BP elevated today with repeat. Will increase amlodipine  to 10 mg daily. - Amlodipine  10 mg daily - F/U one month   Right elbow pain Pain to lateral epicondyle noted after COVID injection ~1 month ago. Injection site still swollen without pain. Pain improving. Etiology unclear, though no alarming symptoms including numbness, tingling, signs of infection. Patient does have signs of post injection reaction at injection site, may be related to elbow pain. Given patients history of metastatic cancer, will order  xray to rule out new malignancy. Patient to see oncology 1/24 - R elbow xray, will discuss with case manager  - continue hot compress to injection site - follow up 1 month    [name], MD [facility]

## 2023-03-23 NOTE — Patient Instructions (Addendum)
 Arthur Ali bing uyu munsi! Urakoze guhitamo [facility] kubuvuzi bwibanze. Arthur Ali yagaragaye linford ububabare bw'amaboko n'umuvuduko w'amaraso.  Uyu munsi twaganiriye: 1. Umuvuduko wamaraso wawe uracyari mwinshi. Twongereye urugero rwimiti kandi twohereje muri farumasi. [place] fata 1 mumiti mishya burimunsi. 2. Kubabara ukuboko bishobora kuba bifitanye isano no gutera inshinge. Komeza compress ishyushye kurubuga rwo gutera inshinge. Tuzategeka kandi xray kugirango tumenye neza ko ntakindi kibaho muri ayo magufa.  Niba utarabikora, iyandikishe kuri Imbonerahamwe yanjye kugirango ubone ibisubizo byoroshye bya Arthur Ali, no kuvugana na muganga wawe wibanze.  Turimo gusuzuma laboratoire uyu munsi. Niba bidasanzwe, nzaguhamagara. Niba ari ibisanzwe, nzakoherereza ubutumwa bwa MyChart (niba bukora) cyangwa ibaruwa muri posita. Niba utumva ibya laboratoire yawe mubyumweru 2 biri imbere, nyamuneka hamagara biro.  Arthur Ali gusubira ku ivuriro ryacu Nta gukurikirana kuri dosiye.  Ndagusaba ko buri gihe uzana imiti yawe suzanne buri gahunda kuko ibi byoroshye kwemeza ko uri kumiti ikwiye kandi ikadufasha kubura kuzuza igihe ubikeneye.  Nyamuneka uhageze iminota 15 mbere yo kubonana kugirango urebe neza ko bigenda neza. Twishimiye imbaraga zanyu mugukora ibi.  Nyamuneka hamagara ivuriro kuri [phone] niba ibimenyetso byawe bikabije cyangwa ufite impungenge.  Urakoze kunyemerera kugira uruhare mukwitaho, [name], MD 03/23/2023, 10:51 AM PGY-1, [facility]uzi bwumuryango [facility]  It was great to see you today! Thank you for choosing [facility] for your primary care. Arthur Ali was seen for arm pain and blood pressure.  Today we addressed: Your blood pressure is still high. We have increased your dose of medication and sent this to the pharmacy. Please take 1 of the new medication daily.  Your arm pain may be related to your injection. Continue warm  compress to the injection site. We will also order an xray to make sure there is nothing else going on in that bone.  If you haven't already, sign up for My Chart to have easy access to your labs results, and communication with your primary care physician.  We are checking some labs today. If they are abnormal, I will call you. If they are normal, I will send you a MyChart message (if it is active) or a letter in the mail. If you do not hear about your labs in the next 2 weeks, please call the office.   You should return to our clinic No follow-ups on file.  I recommend that you always bring your medications to each appointment as this makes it easy to ensure you are on the correct medications and helps us  not miss refills when you need them.  Please arrive 15 minutes before your appointment to ensure smooth check in process.  We appreciate your efforts in making this happen.  Please call the clinic at [phone] if your symptoms worsen or you have any concerns.  Thank you for allowing me to participate in your care, [name], MD 03/23/2023, 10:51 AM PGY-1, [facility]

## 2023-03-23 NOTE — Addendum Note (Signed)
 Addended byPenne Lash on: 03/23/2023 02:05 PM   Modules accepted: Orders

## 2023-03-23 NOTE — Telephone Encounter (Signed)
 Patient contacted with appointment reminder. Transportation assistance provided.  Nicole Cella Tamalyn Wadsworth RN BSN PCCN  [facility] [phone]-cell [phone]-office

## 2023-03-23 NOTE — Addendum Note (Signed)
 Addended byPenne Lash on: 03/23/2023 03:20 PM   Modules accepted: Orders

## 2023-03-24 ENCOUNTER — Other Ambulatory Visit: Payer: Self-pay

## 2023-03-26 ENCOUNTER — Other Ambulatory Visit (HOSPITAL_COMMUNITY): Payer: Self-pay

## 2023-03-26 ENCOUNTER — Other Ambulatory Visit: Payer: Self-pay

## 2023-03-30 ENCOUNTER — Other Ambulatory Visit (HOSPITAL_COMMUNITY): Payer: Self-pay

## 2023-03-30 ENCOUNTER — Telehealth: Payer: Self-pay

## 2023-03-30 NOTE — Congregational Nurse Program (Signed)
  Dept: [phone]   Congregational Nurse Program Note  Date of Encounter: 03/30/2023  Past Medical History: Past Medical History:  Diagnosis Date   Back pain    Hypertension     Encounter Details:  Community Questionnaire - 03/30/23 1240       Questionnaire   Ask client: Do you give verbal consent for me to treat you today? N/A    Student Assistance N/A    Location [facility]erved  [facility]    Encounter Setting Home    Population Status Migrant/Refugee    Engineer, building services or [place] Insurance    Insurance/Financial Assistance Referral N/A    Medication Have Medication Insecurities;Provided Medication Assistance;Patient Medications Reviewed    Medical Provider Yes    Screening Referrals Made N/A    Medical Referrals Made N/A    Medical Appointment Completed [facility] P[facility]inic    CNP Interventions Advocate/Support;Navigate Healthcare System;Counsel;Case Management;Educate    Screenings CN Performed N/A    ED Visit Averted Yes    Life-Saving Intervention Made N/A      Questionnaire   Housing/Utilities Unable to pay for utilities;Referred to housing/utility assistance program            Patient came in to see Congregational RN for medication review. He has not received updated Amlodipine 10 mg dose and he is still taking 5 mg.  I have called [facility] who stated that the provider sent to [facility]. Patient has transportation issues and gets his medication shipped at home. I have contacted [facility] and cancelled order. [facility] was able to fill the prescription and it will be shipped to patient home.  Patient educated to start taking Amlodipine 10 mg once it arrives and stop the 5 mg.  Nicole Cella Chisum Habenicht RN BSN PCCN  [facility] [phone]-cell [phone]-office    Ishaan Villamar RN BSN PCCN  [facility] [phone]-cell [phone]-office

## 2023-03-30 NOTE — Telephone Encounter (Signed)
I have called [facility] to refill Xtandi.  Nicole Cella Savannah Morford RN BSN PCCN  [facility] [phone]-cell [phone]-office

## 2023-04-01 ENCOUNTER — Ambulatory Visit: Payer: BC Managed Care – PPO | Admitting: Family Medicine

## 2023-04-01 ENCOUNTER — Encounter: Payer: Self-pay | Admitting: Family Medicine

## 2023-04-01 ENCOUNTER — Other Ambulatory Visit: Payer: Self-pay

## 2023-04-01 ENCOUNTER — Telehealth: Payer: Self-pay

## 2023-04-01 ENCOUNTER — Telehealth: Payer: Self-pay | Admitting: Family Medicine

## 2023-04-01 ENCOUNTER — Ambulatory Visit (HOSPITAL_COMMUNITY)
Admission: RE | Admit: 2023-04-01 | Discharge: 2023-04-01 | Disposition: A | Discharge status: Home / Self Care | Payer: BC Managed Care – PPO | Source: Ambulatory Visit | Attending: Family Medicine | Admitting: Family Medicine

## 2023-04-01 VITALS — BP 148/91 | HR 89 | Wt 132.2 lb

## 2023-04-01 DIAGNOSIS — R079 Chest pain, unspecified: Secondary | ICD-10-CM

## 2023-04-01 DIAGNOSIS — J069 Acute upper respiratory infection, unspecified: Secondary | ICD-10-CM | POA: Diagnosis not present

## 2023-04-01 DIAGNOSIS — C7951 Secondary malignant neoplasm of bone: Secondary | ICD-10-CM

## 2023-04-01 LAB — POC SOFIA 2 FLU + SARS ANTIGEN FIA

## 2023-04-01 NOTE — Patient Instructions (Addendum)
It was wonderful to see you today!  Today you were seen for chest pain and shortness of breath.  We took an EKG which showed [place] that your heart is okay and not the cause of this pain.  We have swabbed you for for the flu and for COVID to see if these could be causing your shortness of breath, and I have ordered a chest x-ray.  You should go to the hospital and go to the radiology department to have this done.  If the chest x-ray shows anything like pneumonia we will call you and prescribe an antibiotic.  If you have any of the following call 911 and go directly to the hospital - New chest pain in the center of your chest - Difficulty breathing even while resting - Worsening pain in your chest  Please call [phone] with any questions about today's appointment.   If you need any additional refills, please call your pharmacy before calling the office.  Gerrit Heck, DO Family Medicine

## 2023-04-01 NOTE — Telephone Encounter (Signed)
Patient called me to request transportation back home from his appointment. He stated that he has no food at home . I talked to front desk staff at his PCP and was provided with some dry food.  Nicole Cella Aubria Vanecek RN BSN PCCN  [facility] [phone]-cell [phone]-office

## 2023-04-01 NOTE — Telephone Encounter (Signed)
Transportation request received from PCP. Patient needing to be seen due to respiratory issues. Transportation scheduled.  Nicole Cella Lynda Wanninger RN BSN PCCN  [facility] [phone]-cell [phone]-office

## 2023-04-01 NOTE — Telephone Encounter (Signed)
Breathing issues: worse than usual. Has flu and cough, but not so bad. Is having more difficulty at night, which is different than usual. I have scheduled him to be seen here in clinic at 1:30pm with me. I will reach out to [employer] to arrange transportation.

## 2023-04-01 NOTE — Assessment & Plan Note (Addendum)
EKG done in office this afternoon reassuring for noncardiac causes.  Patient does have metastatic cancer which puts him at high risk of PE.  Laid out risks and benefits for patient and after shared decision making, patient elected to pursue workup for infectious causes including pneumonia rather than go to the emergency department and be evaluated.  Advised patient that should his pain worsen, his shortness of breath worsen or he did develop new central chest pain he should call 911 and go immediately to the emergency department.  I will schedule follow-up with him after reading the x-ray report.

## 2023-04-01 NOTE — Telephone Encounter (Signed)
Called patient using interpretation services to inform him of the results of his recent elbow x-ray.  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  Patient informed me at that time that he was having trouble sleeping and was concerned about his breathing.  Advised patient that he should be seen soon if he has concerns about his breathing.  Patient related that he is having trouble with transportation at this time.  I will consult with Dr. Manson Passey about transportation issues and what to do in the situation and then call the patient back.

## 2023-04-01 NOTE — Progress Notes (Signed)
    SUBJECTIVE:   CHIEF COMPLAINT / HPI:   Presenting for worsening breathing. Spoke on the phone earlier today and he reports flu-like symptoms and cough, but also shortness of breath mostly at night.   In office he reports feeling winded with ambulation and having trouble catching his breath. He is able to walk without issue, but only slowly. He is also reporting chest pain on the right side near the shoulder that worsens with inspiration. The pain does radiate into the right arm.   Ddx: -post nasal drip/viral cough -CHF? -lung metastasis -pneumonia -PE -ACS  PERTINENT  PMH / PSH: metastatic prostate cancer, HTN  OBJECTIVE:   BP (!) 148/91   Pulse 89   Wt 132 lb 3.2 oz (60 kg)   SpO2 95%   BMI 19.52 kg/m   General: A&O, NAD HEENT: No sign of trauma, EOM grossly intact Cardiac: RRR, no m/r/g Respiratory: CTAB, normal WOB, no w/c/r. No obvious focal lung findings. Pain is not reproducible with palpation of the chest wall.  GI: Soft, NTTP, non-distended  Extremities: NTTP, no peripheral edema. 2+ pulses bilaterally  12 Lead EKG:  ASSESSMENT/PLAN:   Chest pain EKG done in office this afternoon reassuring for noncardiac causes.  Patient does have metastatic cancer which puts him at high risk of PE.  Laid out risks and benefits for patient and after shared decision making, patient elected to pursue workup for infectious causes including pneumonia rather than go to the emergency department and be evaluated.  Advised patient that should his pain worsen, his shortness of breath worsen or he did develop new central chest pain he should call 911 and go immediately to the emergency department.  I will schedule follow-up with him after reading the x-ray report.  URI (upper respiratory infection) Rapid flu/covid swabs ordered.  Also ordered chest x-ray to be completed today.  Advised patient that if chest x-ray was negative he should do supportive care and would likely recover over the  next several days.  Chest x-ray is abnormal we will proceed based on findings.   Gerrit Heck, DO [facility]

## 2023-04-01 NOTE — Assessment & Plan Note (Addendum)
Rapid flu/covid swabs ordered.  Also ordered chest x-ray to be completed today.  Advised patient that if chest x-ray was negative he should do supportive care and would likely recover over the next several days.  Chest x-ray is abnormal we will proceed based on findings.

## 2023-04-02 ENCOUNTER — Inpatient Hospital Stay: Payer: BC Managed Care – PPO

## 2023-04-02 ENCOUNTER — Inpatient Hospital Stay: Payer: BC Managed Care – PPO | Attending: Hematology

## 2023-04-02 ENCOUNTER — Other Ambulatory Visit (HOSPITAL_COMMUNITY): Payer: Self-pay

## 2023-04-02 VITALS — BP 144/91 | HR 90 | Temp 98.3°F | Resp 18

## 2023-04-02 DIAGNOSIS — C61 Malignant neoplasm of prostate: Secondary | ICD-10-CM | POA: Diagnosis present

## 2023-04-02 DIAGNOSIS — R972 Elevated prostate specific antigen [PSA]: Secondary | ICD-10-CM | POA: Diagnosis not present

## 2023-04-02 DIAGNOSIS — G893 Neoplasm related pain (acute) (chronic): Secondary | ICD-10-CM | POA: Diagnosis not present

## 2023-04-02 DIAGNOSIS — C7951 Secondary malignant neoplasm of bone: Secondary | ICD-10-CM

## 2023-04-02 LAB — CMP (CANCER CENTER ONLY)
ALT: 5 U/L (ref 0–44)
AST: 19 U/L (ref 15–41)
Albumin: 3.7 g/dL (ref 3.5–5.0)
Alkaline Phosphatase: 104 U/L (ref 38–126)
Anion gap: 5 (ref 5–15)
BUN: 10 mg/dL (ref 8–23)
CO2: 31 mmol/L (ref 22–32)
Calcium: 9.4 mg/dL (ref 8.9–10.3)
Chloride: 103 mmol/L (ref 98–111)
Creatinine: 0.7 mg/dL (ref 0.61–1.24)
GFR, Estimated: >60 mL/min (ref >60)
Glucose, Bld: 86 mg/dL (ref 70–99)
Potassium: 4.4 mmol/L (ref 3.5–5.1)
Sodium: 139 mmol/L (ref 135–145)
Total Bilirubin: 0.3 mg/dL (ref 0.0–1.2)
Total Protein: 8.7 g/dL — ABNORMAL HIGH (ref 6.5–8.1)

## 2023-04-02 LAB — CBC WITH DIFFERENTIAL (CANCER CENTER ONLY)
Abs Immature Granulocytes: 0.03 10*3/uL (ref 0.00–0.07)
Basophils Absolute: 0 10*3/uL (ref 0.0–0.1)
Basophils Relative: 1 %
Eosinophils Absolute: 0.3 10*3/uL (ref 0.0–0.5)
Eosinophils Relative: 5 %
HCT: 42.1 % (ref 39.0–52.0)
Hemoglobin: 13.4 g/dL (ref 13.0–17.0)
Immature Granulocytes: 1 %
Lymphocytes Relative: 56 %
Lymphs Abs: 3.5 10*3/uL (ref 0.7–4.0)
MCH: 28.1 pg (ref 26.0–34.0)
MCHC: 31.8 g/dL (ref 30.0–36.0)
MCV: 88.3 fL (ref 80.0–100.0)
Monocytes Absolute: 0.4 10*3/uL (ref 0.1–1.0)
Monocytes Relative: 6 %
Neutro Abs: 1.9 10*3/uL (ref 1.7–7.7)
Neutrophils Relative %: 31 %
Platelet Count: 177 10*3/uL (ref 150–400)
RBC: 4.77 MIL/uL (ref 4.22–5.81)
RDW: 13.3 % (ref 11.5–15.5)
WBC Count: 6.2 10*3/uL (ref 4.0–10.5)
nRBC: 0 % (ref 0.0–0.2)

## 2023-04-02 MED ORDER — SODIUM CHLORIDE 0.9 % IV SOLN: Freq: Once | INTRAVENOUS | Status: AC

## 2023-04-02 MED ORDER — ZOLEDRONIC ACID 4 MG/100ML IV SOLN
4.0000 mg | Freq: Once | INTRAVENOUS | Status: AC
  Administered 2023-04-02: 4 mg via INTRAVENOUS
  Filled 2023-04-02: qty 100

## 2023-04-02 NOTE — Congregational Nurse Program (Signed)
  Dept: [phone]   Congregational Nurse Program Note  Date of Encounter: 04/01/2023  Past Medical History: Past Medical History:  Diagnosis Date   Back pain    Hypertension    Prostate cancer [facility])     Encounter Details:  Community Questionnaire - 04/02/23 0800       Questionnaire   Ask client: Do you give verbal consent for me to treat you today? N/A    Student Assistance N/A    Location [facility]erved  [facility]    Encounter Setting Home    Population Status Migrant/Refugee    Engineer, building services or [place] Insurance    Insurance/Financial Assistance Referral N/A    Medication Have Medication Insecurities;Provided Medication Assistance;Patient Medications Reviewed    Medical Provider Yes    Screening Referrals Made N/A    Medical Referrals Made N/A    Medical Appointment Completed [facility] P[facility]inic    CNP Interventions Advocate/Support;Navigate Healthcare System;Counsel;Case Management;Educate    Screenings CN Performed N/A    ED Visit Averted Yes    Life-Saving Intervention Made N/A      Questionnaire   Housing/Utilities Unable to pay for utilities;Referred to housing/utility assistance program            Transportation assistance provided for today`s appointment at the cancer center.  Nicole Cella Oviya Ammar RN BSN PCCN  [facility] [phone]-cell [phone]-office

## 2023-04-02 NOTE — Patient Instructions (Signed)

## 2023-04-03 LAB — PSA, TOTAL AND FREE
PSA, Free Pct: 57.2 %
PSA, Free: 3.72 ng/mL
Prostate Specific Ag, Serum: 6.5 ng/mL — ABNORMAL HIGH (ref 0.0–4.0)

## 2023-04-07 ENCOUNTER — Encounter: Payer: Self-pay | Admitting: Family Medicine

## 2023-04-07 NOTE — Congregational Nurse Program (Signed)
  Dept: [phone]   Congregational Nurse Program Note  Date of Encounter: 04/07/2023  Past Medical History: Past Medical History:  Diagnosis Date   Back pain    Hypertension    Prostate cancer [facility])     Encounter Details:  Community Questionnaire - 04/07/23 1137       Questionnaire   Ask client: Do you give verbal consent for me to treat you today? N/A    Student Assistance N/A    Location [facility] Served  [facility]    Encounter Setting CN site    Population Status Migrant/Refugee    Engineer, building services or [place] Insurance    Insurance/Financial Assistance Referral N/A    Medication Have Medication Insecurities;Provided Medication Assistance;Patient Medications Reviewed    Medical Provider Yes    Screening Referrals Made N/A    Medical Referrals Made N/A    Medical Appointment Completed [facility] PCP[facility]Clinic    CNP Interventions Advocate/Support;Navigate Healthcare System;Counsel;Case Management;Educate    Screenings CN Performed Blood Pressure    ED Visit Averted Yes    Life-Saving Intervention Made N/A      Questionnaire   Housing/Utilities Unable to pay for utilities;Referred to housing/utility assistance program            Patient brought in medication for review. Noted non compliance with Xtandi. He is taking one tablet in stead of 2 tablets. He is also taking Eliquis once instead of twice. Re- educated in [place]. Medication labelled in Swahili.   Nicole Cella Reubin Bushnell RN BSN PCCN  [facility] [phone]-cell [phone]-office

## 2023-04-10 NOTE — Congregational Nurse Program (Signed)
  Dept: [phone]   Congregational Nurse Program Note  Date of Encounter: 04/10/2023  Past Medical History: Past Medical History:  Diagnosis Date   Back pain    Hypertension    Prostate cancer [facility])     Encounter Details:  Community Questionnaire - 04/10/23 0815       Questionnaire   Ask client: Do you give verbal consent for me to treat you today? N/A    Student Assistance N/A    Location [facility] Served  [facility]    Encounter Setting CN site    Population Status Migrant/Refugee    Engineer, building services or [place] Insurance    Insurance/Financial Assistance Referral N/A    Medication Have Medication Insecurities;Provided Medication Assistance;Patient Medications Reviewed    Medical Provider Yes    Screening Referrals Made N/A    Medical Referrals Made N/A    Medical Appointment Completed [facility] PCP/Clinic    CNP Interventions Advocate/Support;Navigate Healthcare System;Counsel;Case Management;Educate    Screenings CN Performed N/A    ED Visit Averted Yes    Life-Saving Intervention Made N/A      Questionnaire   Housing/Utilities Unable to pay for utilities;Referred to housing/utility assistance program            Patient has been provided with food donation to last about 2 months.His employer declined to take him back to work.He will loose the employer health coverage soon.Paper work has been submitted to [employer] and DSS for [employer] and [employer].  Nicole Cella Stephanie Littman RN BSN PCCN  [facility] [phone]-cell [phone]-office

## 2023-04-13 ENCOUNTER — Ambulatory Visit (INDEPENDENT_AMBULATORY_CARE_PROVIDER_SITE_OTHER): Payer: BC Managed Care – PPO | Admitting: Family Medicine

## 2023-04-13 VITALS — BP 135/89 | HR 99 | Ht 69.0 in | Wt 130.0 lb

## 2023-04-13 DIAGNOSIS — C61 Malignant neoplasm of prostate: Secondary | ICD-10-CM | POA: Diagnosis not present

## 2023-04-13 DIAGNOSIS — C7951 Secondary malignant neoplasm of bone: Secondary | ICD-10-CM | POA: Diagnosis not present

## 2023-04-13 DIAGNOSIS — M25521 Pain in right elbow: Secondary | ICD-10-CM

## 2023-04-13 DIAGNOSIS — R0602 Shortness of breath: Secondary | ICD-10-CM | POA: Insufficient documentation

## 2023-04-13 NOTE — Patient Instructions (Addendum)
 Byari byiza cyane bing uyu munsi! Urakoze guhitamo [facility] kubuvuzi bwibanze. Arthur Ali yagaragaye Arthur Ali Arthur Ali ukuboko.  Uyu munsi twaganiriye: 1. Nishimiye ko ukuboko kwawe kubabara no guhumeka neza bigenda bitera imbere.  Niba ufite impungenge cyangwa ufite ikibazo cyo guhumeka nabi, kubabara mu gatuza, kubyimba ukuguru gushya, cyangwa gukorora amaraso nyamuneka hamagara ivuriro cyangwa ujye mubyihutirwa.  Niba utarabikora, iyandikishe kuri Imbonerahamwe yanjye kugirango ubone ibisubizo byoroshye bya Hyde, no kuvugana na muganga wawe wibanze.  Hamagara ivuriro kuri [phone] niba ibimenyetso byawe bikabije cyangwa ufite impungenge.  Nyamuneka uhageze iminota 15 mbere yo kubonana kugirango urebe neza ko bigenda neza.  Twishimiye imbaraga zanyu mugukora ibi.  Urakoze bonni kugira uruhare mukwitaho, Jared Whorley, [place] 04/13/2023, 3:43 PM PGY-1, Ubuvuzi bwumuryango [facility]  It was great to see you today! Thank you for choosing [facility] for your primary care. Arthur Ali was seen for shortness of breath, arm pain.  Today we addressed: I am glad your arm pain and shortness of breath are improving.  If you have any concerns or develop worsening shortness of breath, chest pain, new leg swelling, or coughing up blood please call the clinic or go to the emergency room.  If you haven't already, sign up for My Chart to have easy access to your labs results, and communication with your primary care physician.  Call the clinic at [phone] if your symptoms worsen or you have any concerns.  Please arrive 15 minutes before your appointment to ensure smooth check in process.  We appreciate your efforts in making this happen.  Thank you for allowing me to participate in your care, Elyce Prescott, DO 04/13/2023, 3:43 PM PGY-1, [facility]

## 2023-04-13 NOTE — Progress Notes (Signed)
    SUBJECTIVE:   CHIEF COMPLAINT / HPI:   SOB f/u - States his SOB is improving - Productive cough x2 weeks that is also improving - Denies fever, chest pain, hemoptysis, leg swelling - Has not missed any doses of his Eliquis , able to tell me how often he takes it and showed me the bottle -Was seen here on 1/23 for chest pain, EKG was unremarkable at that time, chest x-ray obtained with no acute findings  Arm pain f/u - R upper arm pain from shoulder to elbow, states that this is also improving Did have elbow x-ray on 1/14 that was unremarkable  PERTINENT  PMH / PSH: Metastatic prostate cancer to bone, hypertension  OBJECTIVE:   BP 135/89   Pulse 99   Ht 5' 9 (1.753 m)   Wt 130 lb (59 kg)   SpO2 97%   BMI 19.20 kg/m   Gen: cachectic, well appearing today, no acute distress CV: RRR, no m/r/g Pulm: mild coarse breath sounds bilaterally, no focal lung findings, no wheezing, good air movement bilaterally, breathing comfortably on room air MSK: no leg swelling BLE  ASSESSMENT/PLAN:   Shortness of breath Shortness of breath x 2 weeks, patient states this is improving.  Sounds like it could have been viral URI given he also had a cough that is improving.  No chest pain, hemoptysis, leg swelling today.  He has not missed any doses of his Eliquis , less likely PE.  Chest x-ray also unremarkable and afebrile, less likely pneumonia.  Very reassuring that his symptoms are improving and he is talking comfortably in complete sentences today. - Return precautions discussed  Right elbow pain Improving, suspect this is MSK related.  X-ray right elbow unremarkable for acute findings.  Very reassuring that this is improving. - Return precautions discussed   Also verified that patient had refills on his amlodipine , Eliquis , and Xtandi , which he does. He states that his medications are being delivered to his house. States that he does not need food today  Dakayla Disanti, DO [facility]

## 2023-04-13 NOTE — Congregational Nurse Program (Signed)
  Dept: [phone]   Congregational Nurse Program Note  Date of Encounter: 04/13/2023  Past Medical History: Past Medical History:  Diagnosis Date   Back pain    Hypertension    Prostate cancer [facility])     Encounter Details:  Community Questionnaire - 04/13/23 1150       Questionnaire   Ask client: Do you give verbal consent for me to treat you today? N/A    Student Assistance [employer]    Location [facility] Served  [facility]    Encounter Setting CN site    Population Status Migrant/Refugee    Engineer, Building Services or [place] Insurance    Insurance/Financial Assistance Referral N/A    Medication Have Medication Insecurities;Provided Medication Assistance;Patient Medications Reviewed    Medical Provider Yes    Screening Referrals Made N/A    Medical Referrals Made N/A    Medical Appointment Completed [facility] PCP[facility]Clinic    CNP Interventions Advocate/Support;Navigate Healthcare System;Counsel;Case Management;Educate    Screenings CN Performed N/A    ED Visit Averted Yes    Life-Saving Intervention Made N/A      Questionnaire   Housing/Utilities Unable to pay for utilities;Referred to housing/utility assistance program           Mr Jaquell has started attending ESL classes at [facility]. He is exited about this. Application for Food and Nutrition services is completed and I will provided transportation for Mr Jordell to drop it at DSS  Naomie Jahnay Lantier RN BSN PCCN  [facility] [phone] [phone]-office

## 2023-04-13 NOTE — Assessment & Plan Note (Signed)
 Shortness of breath x 2 weeks, patient states this is improving.  Sounds like it could have been viral URI given he also had a cough that is improving.  No chest pain, hemoptysis, leg swelling today.  He has not missed any doses of his Eliquis , less likely PE.  Chest x-ray also unremarkable and afebrile, less likely pneumonia.  Very reassuring that his symptoms are improving and he is talking comfortably in complete sentences today. - Return precautions discussed

## 2023-04-13 NOTE — Assessment & Plan Note (Signed)
Improving, suspect this is MSK related.  X-ray right elbow unremarkable for acute findings.  Very reassuring that this is improving. - Return precautions discussed

## 2023-04-19 NOTE — Congregational Nurse Program (Signed)
  Dept: [phone]   Congregational Nurse Program Note  Date of Encounter: 04/19/2023  Past Medical History: Past Medical History:  Diagnosis Date   Back pain    Hypertension    Prostate cancer [facility])     Encounter Details:  Community Questionnaire - 04/19/23 1355       Questionnaire   Ask client: Do you give verbal consent for me to treat you today? N/A    Student Assistance N/A    Location Patient Served  [facility]    Encounter Setting CN site    Population Status Migrant/Refugee    Engineer, building services or [place] Insurance    Insurance/Financial Assistance Referral N/A    Medication Have Medication Insecurities;Provided Medication Assistance;Patient Medications Reviewed    Medical Provider Yes    Screening Referrals Made N/A    Medical Referrals Made N/A    Medical Appointment Completed [facility] PCP/Clinic    CNP Interventions Advocate/Support;Navigate Healthcare System;Counsel;Case Management;Educate    Screenings CN Performed N/A    ED Visit Averted Yes    Life-Saving Intervention Made N/A      Questionnaire   Housing/Utilities Unable to pay for utilities;Referred to housing/utility assistance program            Transportation assistance provided. Patient successfully dropped the [facility] application paper work at [employer]. He has enough food from a donation to last next couple of weeks.   Perla Bradford Deantae Shackleton RN BSN PCCN  [facility] [phone]-cell [phone]-office

## 2023-04-29 ENCOUNTER — Other Ambulatory Visit: Payer: Self-pay

## 2023-04-29 DIAGNOSIS — C61 Malignant neoplasm of prostate: Secondary | ICD-10-CM

## 2023-04-29 NOTE — Progress Notes (Signed)
 HEMATOLOGY/ONCOLOGY CLINIC NOTE  Date of Service: 04/30/2023  Patient Care Team: [name], MD as PCP - General (Family Medicine) Arthur Cushing, RN as Oncology Nurse Navigator  CHIEF COMPLAINTS/PURPOSE OF CONSULTATION:  Evaluation and management of newly metastatic prostate cancer  HISTORY OF PRESENTING ILLNESS:  Arthur Ali is a wonderful 63 y.o. male who is being seen for evaluation and management of likely metastatic prostate cancer based on elevated PSA levels.     Patient was previously seen by me in 2021 for lymphocytosis thought to be a reactive process. At that time, he had no signs of monoclonal lymphocytes. He was also vitamin B12 deficient at that time and his pseudothrombocytopenia had resolved.    Patient presented to the hospital with worsening dyspnea chest pain night sweats loss of appetite and unintentional weight loss and will directly admitted from the family medicine clinic.  CT of the chest done on 11/19/2022 showed no evidence of pulmonary embolism but did show pathologic thoracic adenopathy within the right paratracheal and subcarinal lymph node groups as well as widespread sclerotic metastatic disease throughout the visualized axial skeleton.  He was noted to have a 6 mm noncalcified pulmonary nodule in the right upper lobe.  Also noted to have mild central pulmonary artery enlargement with suggestions of elevated right heart pressure. He also had a x-ray of the lumbar spine which showed degenerative change of the lumbar spine and diffuse mottled appearance of the femoral heads and pelvis which could be concerning for metastatic disease.   Patient had a PSA test which showed significant elevation to nearly 1500 being highly suggestive of metastatic prostate cancer. TB QuantiFERON test is currently pending.   Patient CBC today shows normal WBC count of 10.2k with anemia with a hemoglobin of 10.5 and a platelet count of 174k. CMP shows hypocalcemia with  calcium of 7.9, decreased albumin of 1.8 and alkaline phosphatase of 500. Normal transaminases and bilirubin.  Creatinine is within normal limits at 0.76.   HIV test is nonreactive TSH was within normal limits at 1.85 Multiple myeloma panel and K/L FLC are currently pending.   Patient was seen in airborne isolation with the help of video [place] interpreter Rella Larve).  He notes that he has lost 10 to 15 kg of body weight with significantly decreased p.o. intake.  Notes decreased urinary flow about a week ago but is a little better now.  No overt hematuria.  Notes some issues with constipation. Has had diffuse bodyaches especially over the spine and pelvis.   Also notes some shortness of breath. Chest wall pain noted. Notes no new upper or lower extremity weakness, no loss of bowel or bladder control.  INTERVAL HISTORY:  Arthur Ali is a wonderful 63 y.o. male who is here for continue evaluation and management of metastatic prostate cancer. Patient was last seen by me on 01/15/2023 and complained of lack of appetite, persistent lower back pain limiting his activity, night sweats, itchy facial rash, breathing issues specifically after eating sometimes, pain at injection site under his left upper extremity, and dental discomfort when eating hard/cold foods.   Today, he is accompanied by an interpreter. He reports mild pain in the right elbow beginning 3 weeks ago. Patient denies any known trigger. He reports that his pain began one morning after sleeping on his right arm the night prior. He denies any heavy lifting recently. He reports that his elbow pain fluctuates and is sometimes worsened when lifting 2-pound objects and with movement. His  right elbow is painful on palpation.  Patient does not have difficulty breathing like previously and his back pain improved. He is needing to use oxycodone for back pain. He denies any abdominal pain or leg swelling.   Patient has been tolerating  his treatment well with no toxicity issues.   Patient has lost 4 pounds in the last month and currently weighs 128 pounds. He generally eats once a day and typically consumes rice and beans provided by his community nurse. Patient expresses concern in regards to financial limitations and potentially becoming homeless.   Patient ran out of elliquis yesterday. He also reports that he needs a refill of his blood pressure medication prescribed by his PCP.   MEDICAL HISTORY:  Past Medical History:  Diagnosis Date   Back pain    Hypertension    Prostate cancer (HCC)     SURGICAL HISTORY: No past surgical history on file.  SOCIAL HISTORY: Social History   Socioeconomic History   Marital status: Married    Spouse name: Not on file   Number of children: Not on file   Years of education: Not on file   Highest education level: Not on file  Occupational History   Not on file  Tobacco Use   Smoking status: Former    Types: Cigars, Cigarettes   Smokeless tobacco: Never   Tobacco comments:    Quit in 2022.   Vaping Use   Vaping status: Never Used  Substance and Sexual Activity   Alcohol use: Never   Drug use: Never   Sexual activity: Not Currently  Other Topics Concern   Not on file  Social History Narrative   Single applicant from [place]   No family here or in [place]      Refugee Information   Number of Immediate Family Members: 0   Number of Immediate Family Members in [place]: 0   Country of Birth: [facility]   Country of Origin: [facility]   Location of Refugee Camp: [place]   Duration in [place]: 20 years or greater   Reason for Leaving Home Country: Political opinion   Primary Language: Swahili/Kiswahili, Kinyarwanda/Rw[place]   Able to Read in Primary Language: Yes   Able to Write in Primary Language: Yes   Education: Primary School   Prior Work: No previous jobs   Marital Status: Other (Wife and children decreased)   Sexual Activity: No   Tuberculosis Screening Overseas: Positive    Tuberculosis Screening Health Department: Not Completed   Health Department Labs Completed: Yes   History of Trauma: None   Do You Feel Jumpy or Nervous?: No   Are You Very Watchful or 'Super Alert'?: No   Social Drivers of Health   Financial Resource Strain: High Risk (02/09/2023)   Overall Financial Resource Strain (CARDIA)    Difficulty of Paying Living Expenses: Very hard  Food Insecurity: Food Insecurity Present (04/01/2023)   Hunger Vital Sign    Worried About Running Out of Food in the Last Year: Often true    Ran Out of Food in the Last Year: Often true  Transportation Needs: Unmet Transportation Needs (03/02/2023)   PRAPARE - Administrator, Civil Service (Medical): Yes    Lack of Transportation (Non-Medical): Yes  Physical Activity: Not on file  Stress: Not on file  Social Connections: Not on file  Intimate Partner Violence: Not At Risk (11/19/2022)   Humiliation, Afraid, Rape, and Kick questionnaire    Fear of Current or Ex-Partner: No  Emotionally Abused: No    Physically Abused: No    Sexually Abused: No    FAMILY HISTORY: Family History  Family history unknown: Yes    ALLERGIES:  has no known allergies.  MEDICATIONS:  Current Outpatient Medications  Medication Sig Dispense Refill   acetaminophen (TYLENOL) 500 MG tablet Take 2 tablets (1,000 mg total) by mouth every 8 (eight) hours as needed. 100 tablet 0   amLODipine (NORVASC) 10 MG tablet Take 1 tablet (10 mg total) by mouth at bedtime. 30 tablet 1   apixaban (ELIQUIS) 5 MG TABS tablet Take 1 tablet (5 mg total) by mouth 2 (two) times daily. 60 tablet 4   enzalutamide (XTANDI) 80 MG tablet Take 2 tablets (160 mg total) by mouth daily. 60 tablet 11   oxyCODONE (OXY IR/ROXICODONE) 5 MG immediate release tablet Take 1 tablet (5 mg total) by mouth every 8 (eight) hours as needed for severe pain (pain score 7-10). 60 tablet 0   polyethylene glycol powder (GLYCOLAX/MIRALAX) 17 GM/SCOOP powder Take 17 g  by mouth 2 (two) times daily as needed. 3350 g 1   senna (SENOKOT) 8.6 MG TABS tablet Take 1 tablet (8.6 mg total) by mouth daily. 30 tablet 3   Vitamin D, Ergocalciferol, (DRISDOL) 1.25 MG (50000 UNIT) CAPS capsule Take 1 capsule (50,000 Units total) by mouth every 7 (seven) days. 12 capsule 3   No current facility-administered medications for this visit.    REVIEW OF SYSTEMS:    10 Point review of Systems was done is negative except as noted above.   PHYSICAL EXAMINATION: ECOG PERFORMANCE STATUS: 2 - Symptomatic, <50% confined to bed  . Vitals:   04/30/23 1001 04/30/23 1003  BP: (!) 147/100 (!) 132/100  Pulse: 81   Resp: 17   Temp: (!) 97.5 F (36.4 C)   SpO2: 91%      Filed Weights   04/30/23 1001  Weight: 128 lb 14.4 oz (58.5 kg)     .Body mass index is 19.04 kg/m.   GENERAL:alert, in no acute distress and comfortable SKIN: no acute rashes, no significant lesions EYES: conjunctiva are pink and non-injected, sclera anicteric OROPHARYNX: MMM, no exudates, no oropharyngeal erythema or ulceration NECK: supple, no JVD LYMPH:  no palpable lymphadenopathy in the cervical, axillary or inguinal regions LUNGS: clear to auscultation b/l with normal respiratory effort HEART: regular rate & rhythm ABDOMEN:  normoactive bowel sounds , non tender, not distended. Extremity: no pedal edema PSYCH: alert & oriented x 3 with fluent speech NEURO: no focal motor/sensory deficits   LABORATORY DATA:  I have reviewed the data as listed  .    Latest Ref Rng & Units 04/30/2023    9:30 AM 04/02/2023    9:33 AM 03/05/2023    9:27 AM  CBC  WBC 4.0 - 10.5 K/uL 5.8  6.2  6.2   Hemoglobin 13.0 - 17.0 g/dL 09.8  11.9  14.7   Hematocrit 39.0 - 52.0 % 44.0  42.1  41.1   Platelets 150 - 400 K/uL 182  177  181     .    Latest Ref Rng & Units 04/30/2023    9:30 AM 04/02/2023    9:33 AM 03/05/2023    9:27 AM  CMP  Glucose 70 - 99 mg/dL 829  86  562   BUN 8 - 23 mg/dL 8  10  14     Creatinine 0.61 - 1.24 mg/dL 1.30  8.65  7.84   Sodium 135 - 145  mmol/L 136  139  141   Potassium 3.5 - 5.1 mmol/L 3.5  4.4  3.8   Chloride 98 - 111 mmol/L 100  103  106   CO2 22 - 32 mmol/L 30  31  30    Calcium 8.9 - 10.3 mg/dL 9.3  9.4  9.5   Total Protein 6.5 - 8.1 g/dL 8.5  8.7  9.0   Total Bilirubin 0.0 - 1.2 mg/dL 0.4  0.3  0.3   Alkaline Phos 38 - 126 U/L 96  104  157   AST 15 - 41 U/L 21  19  20    ALT 0 - 44 U/L 8  5  5     Component     Latest Ref Rng 11/20/2022 11/21/2022  Prostatic Specific Antigen     0.00 - 4.00 ng/mL 1,444.22 (H)    Vitamin D, 25-Hydroxy     30 - 100 ng/mL  33.22     Legend: (H) High  SURGICAL PATHOLOGY  CASE: [phone]  PATIENT: Angela Cox  Surgical Pathology Report      Clinical History: sclerotic changes/infiltration, anticipated prostate  mets (cm)      FINAL MICROSCOPIC DIAGNOSIS:   A. BONE, RIGHT POSTERIOR ILIAC, BIOPSY:  -  Metastatic carcinoma, consistent with prostate origin.   Note: The biopsy of bone with extensive bone marrow fibrosis/sclerosis.  Embedded in the sclerotic stroma are extremely crushed cells without  identifiable morphology; however, the clinical impression of metastatic  prostate carcinoma is noted and confirmatory immunohistochemical stains  highlight these crushed cells of interest as positive for NKX3.1  consistent with metastatic carcinoma of prostate primary.  Dr. Lovelock Callas is  peer-reviewed the case and agrees with the interpretation.    RADIOGRAPHIC STUDIES: I have personally reviewed the radiological images as listed and agreed with the findings in the report. DG Chest 2 View Result Date: 04/06/2023 CLINICAL DATA:  Cough.  Clinical concern for pneumonia. EXAM: CHEST - 2 VIEW COMPARISON:  11/05/2022.  CT, 11/24/2022. FINDINGS: Cardiac silhouette is normal in size and configuration. No mediastinal or hilar masses. No evidence of adenopathy. Lungs are hyperexpanded, but clear. No pleural  effusion or pneumothorax. Skeletal structures are intact. IMPRESSION: 1. No acute cardiopulmonary disease. 2. COPD. Electronically Signed   By: Amie Portland M.D.   On: 04/06/2023 13:13    ASSESSMENT & PLAN:   63 y.o. male from [place] who primarily speaks Kinyarwanda (very little [place]) presenting with shortness of breath, failure to thrive and generalized body pains   1,Newly diagnosed Metastatic prostate cancer with extensive skeletal lesions and thoracic adenopathy. Stage 5.  PSA level nearly 1500.   2.  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   NO evidence of monoclonal paraproteinemia to suggestive primary bone marrow disorder.   3.  Mild lymphocytosis lymphocyte count of 5.2k previously noted to be nonclonal.  Likely from chronic inflammation/reactive.  Previously has been as high as 9.2k   4.  Findings of pulmonary hypertension on CTA--echocardiogram  with normal EF 60-65%, grade 1 diastolic dysfunction   5.  Pulmonary embolism -- possible-- on ELiquis   6.  Severe protein calorie malnutrition with weight loss of 30 to 40 pounds due to his metastatic prostate cancer.  7. Cancer related pain  PLAN:  -Discussed lab results on 04/30/2023 in detail with patient. CBC normal, showed WBC of 5.8K, hemoglobin of 14.1, and platelets of 182K. -anemia resolved -last CMP showed improvement in Alkaline phasphatase  -CMP today is stable -his last PSA  testing from 04/02/2023 showed that his PSA level  improved to 6.5 ng/mL from 234 ng/mL four months ago indicating very good response to treatment. His cancer has shrunk by more than 90%. -will continue to monitor PSA levels to evaluate his response PSA tpday 5.3 -will refill eliquis -continue Xtandi 160mg  po daily -will refill high-dose Vitamin D -right elbow pain is most likely not related to prostate cancer -will prescribe Prednisone to take for a few days to reduce inflammation in right elbow.  -advised  patient to avoid any heavy lifting and to rest the area -discussed if his right elbow pain is persistent, there may be a role for orthopedics for further evaluation -discussed that there is a need to optimize food intake to ensure that he maintains strength to continue fighting prostate cancer -discussed option of looking into potential organized enrollment programs in his community -discussed option to connect patient with a Child psychotherapist including to discuss option of applying for social security disability benefits.   FOLLOW-UP: Continue monthly zometa Continue every 3 monthly Eligard RTC with Dr Candise Che with labs in 3 months  The total time spent in the appointment was 32 minutes* .  All of the patient's questions were answered with apparent satisfaction. The patient knows to call the clinic with any problems, questions or concerns.   Wyvonnia Lora MD MS AAHIVMS [facility] Hematology/Oncology Physician [facility]  .*Total Encounter Time as defined by the Centers for Medicare and Medicaid Services includes, in addition to the face-to-face time of a patient visit (documented in the note above) non-face-to-face time: obtaining and reviewing outside history, ordering and reviewing medications, tests or procedures, care coordination (communications with other health care professionals or caregivers) and documentation in the medical record.    I,Mitra Faeizi,acting as a Neurosurgeon for Wyvonnia Lora, MD.,have documented all relevant documentation on the behalf of Wyvonnia Lora, MD,as directed by  Wyvonnia Lora, MD while in the presence of Wyvonnia Lora, MD.  .I have reviewed the above documentation for accuracy and completeness, and I agree with the above. Johney Maine MD

## 2023-04-30 ENCOUNTER — Inpatient Hospital Stay (HOSPITAL_BASED_OUTPATIENT_CLINIC_OR_DEPARTMENT_OTHER): Payer: BC Managed Care – PPO | Admitting: Hematology

## 2023-04-30 ENCOUNTER — Other Ambulatory Visit: Payer: Self-pay

## 2023-04-30 ENCOUNTER — Other Ambulatory Visit (HOSPITAL_COMMUNITY): Payer: Self-pay

## 2023-04-30 ENCOUNTER — Inpatient Hospital Stay: Payer: BC Managed Care – PPO | Attending: Hematology

## 2023-04-30 ENCOUNTER — Encounter: Payer: Self-pay | Admitting: *Deleted

## 2023-04-30 ENCOUNTER — Encounter: Payer: Self-pay | Admitting: Hematology

## 2023-04-30 ENCOUNTER — Telehealth: Payer: Self-pay

## 2023-04-30 ENCOUNTER — Inpatient Hospital Stay: Payer: BC Managed Care – PPO

## 2023-04-30 VITALS — BP 132/100 | HR 81 | Temp 97.5°F | Resp 17 | Ht 69.0 in | Wt 128.9 lb

## 2023-04-30 DIAGNOSIS — C7951 Secondary malignant neoplasm of bone: Secondary | ICD-10-CM | POA: Insufficient documentation

## 2023-04-30 DIAGNOSIS — C61 Malignant neoplasm of prostate: Secondary | ICD-10-CM

## 2023-04-30 DIAGNOSIS — I10 Essential (primary) hypertension: Secondary | ICD-10-CM | POA: Diagnosis not present

## 2023-04-30 LAB — CMP (CANCER CENTER ONLY)
ALT: 8 U/L (ref 0–44)
AST: 21 U/L (ref 15–41)
Albumin: 3.8 g/dL (ref 3.5–5.0)
Alkaline Phosphatase: 96 U/L (ref 38–126)
Anion gap: 6 (ref 5–15)
BUN: 8 mg/dL (ref 8–23)
CO2: 30 mmol/L (ref 22–32)
Calcium: 9.3 mg/dL (ref 8.9–10.3)
Chloride: 100 mmol/L (ref 98–111)
Creatinine: 0.67 mg/dL (ref 0.61–1.24)
GFR, Estimated: >60 mL/min (ref >60)
Glucose, Bld: 134 mg/dL — ABNORMAL HIGH (ref 70–99)
Potassium: 3.5 mmol/L (ref 3.5–5.1)
Sodium: 136 mmol/L (ref 135–145)
Total Bilirubin: 0.4 mg/dL (ref 0.0–1.2)
Total Protein: 8.5 g/dL — ABNORMAL HIGH (ref 6.5–8.1)

## 2023-04-30 LAB — CBC WITH DIFFERENTIAL (CANCER CENTER ONLY)
Abs Immature Granulocytes: 0.02 10*3/uL (ref 0.00–0.07)
Basophils Absolute: 0 10*3/uL (ref 0.0–0.1)
Basophils Relative: 1 %
Eosinophils Absolute: 0.3 10*3/uL (ref 0.0–0.5)
Eosinophils Relative: 5 %
HCT: 44 % (ref 39.0–52.0)
Hemoglobin: 14.1 g/dL (ref 13.0–17.0)
Immature Granulocytes: 0 %
Lymphocytes Relative: 61 %
Lymphs Abs: 3.5 10*3/uL (ref 0.7–4.0)
MCH: 27.8 pg (ref 26.0–34.0)
MCHC: 32 g/dL (ref 30.0–36.0)
MCV: 86.8 fL (ref 80.0–100.0)
Monocytes Absolute: 0.2 10*3/uL (ref 0.1–1.0)
Monocytes Relative: 3 %
Neutro Abs: 1.7 10*3/uL (ref 1.7–7.7)
Neutrophils Relative %: 30 %
Platelet Count: 182 10*3/uL (ref 150–400)
RBC: 5.07 MIL/uL (ref 4.22–5.81)
RDW: 13.3 % (ref 11.5–15.5)
WBC Count: 5.8 10*3/uL (ref 4.0–10.5)
nRBC: 0 % (ref 0.0–0.2)

## 2023-04-30 MED ORDER — ENZALUTAMIDE 80 MG PO TABS: 160.0000 mg | ORAL_TABLET | Freq: Every day | ORAL | 11 refills | Status: DC

## 2023-04-30 MED ORDER — AMLODIPINE BESYLATE 10 MG PO TABS
10.0000 mg | ORAL_TABLET | Freq: Every day | ORAL | 1 refills | Status: DC
  Filled 2023-04-30: qty 30, 30d supply, fill #0

## 2023-04-30 MED ORDER — SODIUM CHLORIDE 0.9 % IV SOLN: INTRAVENOUS | Status: DC

## 2023-04-30 MED ORDER — PREDNISONE 20 MG PO TABS
40.0000 mg | ORAL_TABLET | Freq: Every day | ORAL | 0 refills | Status: AC
  Filled 2023-04-30: qty 14, 7d supply, fill #0

## 2023-04-30 MED ORDER — ZOLEDRONIC ACID 4 MG/100ML IV SOLN
4.0000 mg | Freq: Once | INTRAVENOUS | Status: AC
Start: 2023-04-30 — End: 2023-04-30
  Administered 2023-04-30: 4 mg via INTRAVENOUS
  Filled 2023-04-30: qty 100

## 2023-04-30 MED ORDER — VITAMIN D (ERGOCALCIFEROL) 1.25 MG (50000 UNIT) PO CAPS
50000.0000 [IU] | ORAL_CAPSULE | ORAL | 3 refills | Status: DC
  Filled 2023-04-30: qty 12, 84d supply, fill #0

## 2023-04-30 MED ORDER — APIXABAN 5 MG PO TABS
5.0000 mg | ORAL_TABLET | Freq: Two times a day (BID) | ORAL | 4 refills | Status: DC
  Filled 2023-04-30: qty 60, 30d supply, fill #0

## 2023-04-30 NOTE — Progress Notes (Signed)
 Patient seen by Dr. Addison Naegeli are within treatment parameters.  Labs reviewed: and are within treatment parameters.  Per physician team, patient is ready for treatment and there are NO modifications to the treatment plan.

## 2023-04-30 NOTE — Telephone Encounter (Signed)
Patient called with appointment reminder.Transportation assistance will be provided.  Nicole Cella Kemper Heupel RN BSN PCCN  [facility] [phone]-cell [phone]-office

## 2023-04-30 NOTE — Patient Instructions (Signed)

## 2023-05-01 LAB — PSA, TOTAL AND FREE
PSA, Free Pct: 56.2 %
PSA, Free: 2.98 ng/mL
Prostate Specific Ag, Serum: 5.3 ng/mL — ABNORMAL HIGH (ref 0.0–4.0)

## 2023-05-04 ENCOUNTER — Inpatient Hospital Stay: Payer: BC Managed Care – PPO

## 2023-05-04 NOTE — Congregational Nurse Program (Signed)
  Dept: [phone]   Congregational Nurse Program Note  Date of Encounter: 05/04/2023  Past Medical History: Past Medical History:  Diagnosis Date   Back pain    Hypertension    Prostate cancer [facility])     Encounter Details:  Community Questionnaire - 05/04/23 1104       Questionnaire   Ask client: Do you give verbal consent for me to treat you today? N/A    Student Assistance N/A    Location Patient Served  [facility]    Encounter Setting CN site    Population Status Migrant/Refugee    Engineer, building services or [place] Insurance    Insurance/Financial Assistance Referral N/A    Medication Have Medication Insecurities;Provided Medication Assistance;Patient Medications Reviewed    Medical Provider Yes    Screening Referrals Made N/A    Medical Referrals Made N/A    Medical Appointment Completed [facility] P[facility]inic    CNP Interventions Advocate/Support;Navigate Healthcare System;Counsel;Case Management;Educate    Screenings CN Performed N/A    ED Visit Averted Yes    Life-Saving Intervention Made N/A      Questionnaire   Housing/Utilities Unable to pay for utilities;Referred to housing/utility assistance program            Patient brought in medication from home that were delivered yesterday. He brought in Novasc, Vitamin D and prednisone. Education provided on the medication administration. Medication bottles instruction written in swahili. I attempted to refill [name], I was informed that his employer insurance was terminated. Pharmacy willing to give medication for 3 days. I requested assistance for a at least one month and  a ticket was submitted.I will inform MD.  Arman Bogus RN BSN PCCN  [facility] [phone]-cell [phone]-office

## 2023-05-04 NOTE — Progress Notes (Signed)
 [facility] Clinical Social Work  Clinical Social Work was referred by nurse for assessment of psychosocial needs(food and income).  Patient has been working with his nurse with CCN to apply for assistance related to SDOH concerns [employer] for [employer], [employer] once employer based insurance is terminated, and [employer] for food insecurity). Clinical Social Worker contacted patient by phone with interpreter [phone] to offer support and assess for needs. Patient's primary need expressed is rental support, in which [employer] is assisting with [employer] application.CSW has no other resources for patient to apply for at this time, and will recommend patient continue to work with RN case manager for time being. Patient verbalized understanding. Food needs can continue to be addressed with food bags a cancer center when needed.   Marguerita Merles, LCSW  Clinical Social Worker [facility]

## 2023-05-05 NOTE — Congregational Nurse Program (Signed)
  Dept: [phone]   Congregational Nurse Program Note  Date of Encounter: 05/05/2023  Past Medical History: Past Medical History:  Diagnosis Date   Back pain    Hypertension    Prostate cancer [facility])     Encounter Details:  Community Questionnaire - 05/05/23 1358       Questionnaire   Ask client: Do you give verbal consent for me to treat you today? N/A    Student Assistance N/A    Location Patient Served  NAI    Encounter Setting CN site    Population Status Migrant/Refugee    Insurance Uninsured ([employer]/Care Connects/Self-Pay/Medicaid Family Planning)    Insurance/Financial Assistance Referral N/A    Medication Have Medication Insecurities;Provided Medication Assistance;Patient Medications Reviewed    Medical Provider Yes    Screening Referrals Made N/A    Medical Referrals Made N/A    Medical Appointment Completed [facility] P[facility]    CNP Interventions Advocate/Support;Navigate Healthcare System;Counsel;Case Management;Educate    Screenings CN Performed N/A    ED Visit Averted Yes    Life-Saving Intervention Made N/A      Questionnaire   Housing/Utilities Unable to pay for utilities;Referred to housing/utility assistance program            patient is now uninsured and unable to refill Xtandi. I have sent email to request for medication assistance though [facility] pharmacists. Patient unable to pay March rent as well.I have request for rent assistance though the [facility] and it has been approved for one time assistance of up to $1000.  Medicaid application is complete and application was given to patient to deliver to DSS. Transportation assistance provided.  Nicole Cella Jaliza Seifried RN BSN PCCN  [facility] [phone]-cell [phone]-office

## 2023-05-06 ENCOUNTER — Other Ambulatory Visit: Payer: Self-pay

## 2023-05-06 ENCOUNTER — Telehealth: Payer: Self-pay | Admitting: Pharmacy Technician

## 2023-05-06 DIAGNOSIS — C61 Malignant neoplasm of prostate: Secondary | ICD-10-CM

## 2023-05-06 MED ORDER — ENZALUTAMIDE 80 MG PO TABS: 160.0000 mg | ORAL_TABLET | Freq: Every day | ORAL | 11 refills | Status: DC

## 2023-05-06 NOTE — Telephone Encounter (Signed)
 Oral Oncology Patient Advocate Encounter   Received notification that the application for assistance for Xtandi through [employer] has been approved.   [employer] number [phone].   Effective dates: 05/06/23 through 03/08/24  Medication will be filled at [facility].  Omer Jack, CPhT-Adv Oncology Pharmacy Patient Advocate [facility]  Direct Number: [phone]  Fax: [phone]

## 2023-05-06 NOTE — Telephone Encounter (Signed)
 Oral Oncology Patient Advocate Encounter  Completed application for XTANDI in an effort to reduce patient's out of pocket expense to $0.    Application completed online and submitted to [employer].   [employer] patient assistance phone number for follow up is [phone].   This encounter will be updated until final determination.    Omer Jack, CPhT-Adv Oncology Pharmacy Patient Advocate [facility]  Direct Number: [phone]  Fax: [phone]

## 2023-05-07 ENCOUNTER — Encounter: Payer: Self-pay | Admitting: Hematology

## 2023-05-11 ENCOUNTER — Telehealth: Payer: Self-pay

## 2023-05-11 NOTE — Telephone Encounter (Signed)
 I have called [phone] for Xtandi refill. Medication assistance approval period is still in the works. This should be completed by tomorrow. I have been asked to call again tomorrow.  Nicole Cella Shermon Bozzi RN BSN PCCN  [facility] [phone]-cell [phone]-office

## 2023-05-12 ENCOUNTER — Telehealth: Payer: Self-pay

## 2023-05-12 NOTE — Telephone Encounter (Signed)
 I have called Xtandi customer services and confirmed that medication is out for delivery. Patient alerted to expect medication tomorrow.  Nicole Cella Carliss Porcaro RN BSN PCCN  [facility] [phone]-cell [phone]-office

## 2023-05-13 ENCOUNTER — Encounter: Payer: Self-pay | Admitting: *Deleted

## 2023-05-13 ENCOUNTER — Inpatient Hospital Stay: Attending: Hematology | Admitting: Licensed Clinical Social Worker

## 2023-05-13 DIAGNOSIS — C61 Malignant neoplasm of prostate: Secondary | ICD-10-CM | POA: Insufficient documentation

## 2023-05-13 DIAGNOSIS — Z5111 Encounter for antineoplastic chemotherapy: Secondary | ICD-10-CM | POA: Insufficient documentation

## 2023-05-13 DIAGNOSIS — C7951 Secondary malignant neoplasm of bone: Secondary | ICD-10-CM

## 2023-05-13 NOTE — Progress Notes (Signed)
 [facility] CSW Progress Note  Visual merchandiser  provided pt w/ a food bag.  CSW to remain available as appropriate to provide support throughout duration of treatment.        Rachel Moulds, LCSW Clinical Social Worker [facility]

## 2023-05-18 ENCOUNTER — Telehealth: Payer: Self-pay | Admitting: Hematology

## 2023-05-18 NOTE — Telephone Encounter (Signed)
 Patient's caregiver is aware of scheduled appointment times/dates

## 2023-05-26 ENCOUNTER — Other Ambulatory Visit: Payer: Self-pay

## 2023-05-26 DIAGNOSIS — C61 Malignant neoplasm of prostate: Secondary | ICD-10-CM

## 2023-05-27 ENCOUNTER — Inpatient Hospital Stay

## 2023-05-27 ENCOUNTER — Ambulatory Visit: Payer: BC Managed Care – PPO

## 2023-05-27 ENCOUNTER — Other Ambulatory Visit: Payer: BC Managed Care – PPO

## 2023-05-28 ENCOUNTER — Encounter: Payer: Self-pay | Admitting: *Deleted

## 2023-05-28 ENCOUNTER — Other Ambulatory Visit: Payer: Self-pay

## 2023-05-28 ENCOUNTER — Inpatient Hospital Stay

## 2023-05-28 VITALS — BP 152/104 | HR 92 | Resp 16

## 2023-05-28 DIAGNOSIS — C7951 Secondary malignant neoplasm of bone: Secondary | ICD-10-CM

## 2023-05-28 DIAGNOSIS — C61 Malignant neoplasm of prostate: Secondary | ICD-10-CM | POA: Diagnosis not present

## 2023-05-28 DIAGNOSIS — Z5111 Encounter for antineoplastic chemotherapy: Secondary | ICD-10-CM | POA: Diagnosis present

## 2023-05-28 LAB — CBC WITH DIFFERENTIAL (CANCER CENTER ONLY)
Abs Immature Granulocytes: 0.02 10*3/uL (ref 0.00–0.07)
Basophils Absolute: 0 10*3/uL (ref 0.0–0.1)
Basophils Relative: 0 %
Eosinophils Absolute: 0.2 10*3/uL (ref 0.0–0.5)
Eosinophils Relative: 2 %
HCT: 42.7 % (ref 39.0–52.0)
Hemoglobin: 14.1 g/dL (ref 13.0–17.0)
Immature Granulocytes: 0 %
Lymphocytes Relative: 57 %
Lymphs Abs: 4.2 10*3/uL — ABNORMAL HIGH (ref 0.7–4.0)
MCH: 27.7 pg (ref 26.0–34.0)
MCHC: 33 g/dL (ref 30.0–36.0)
MCV: 83.9 fL (ref 80.0–100.0)
Monocytes Absolute: 0.4 10*3/uL (ref 0.1–1.0)
Monocytes Relative: 5 %
Neutro Abs: 2.6 10*3/uL (ref 1.7–7.7)
Neutrophils Relative %: 36 %
Platelet Count: 203 10*3/uL (ref 150–400)
RBC: 5.09 MIL/uL (ref 4.22–5.81)
RDW: 13.8 % (ref 11.5–15.5)
WBC Count: 7.4 10*3/uL (ref 4.0–10.5)
nRBC: 0 % (ref 0.0–0.2)

## 2023-05-28 LAB — CMP (CANCER CENTER ONLY)
ALT: 7 U/L (ref 0–44)
AST: 19 U/L (ref 15–41)
Albumin: 4 g/dL (ref 3.5–5.0)
Alkaline Phosphatase: 68 U/L (ref 38–126)
Anion gap: 5 (ref 5–15)
BUN: 12 mg/dL (ref 8–23)
CO2: 28 mmol/L (ref 22–32)
Calcium: 9.7 mg/dL (ref 8.9–10.3)
Chloride: 104 mmol/L (ref 98–111)
Creatinine: 0.72 mg/dL (ref 0.61–1.24)
GFR, Estimated: >60 mL/min (ref >60)
Glucose, Bld: 94 mg/dL (ref 70–99)
Potassium: 3.6 mmol/L (ref 3.5–5.1)
Sodium: 137 mmol/L (ref 135–145)
Total Bilirubin: 0.5 mg/dL (ref 0.0–1.2)
Total Protein: 9 g/dL — ABNORMAL HIGH (ref 6.5–8.1)

## 2023-05-28 MED ORDER — LEUPROLIDE ACETATE (3 MONTH) 22.5 MG ~~LOC~~ KIT
22.5000 mg | PACK | Freq: Once | SUBCUTANEOUS | Status: AC
  Administered 2023-05-28: 22.5 mg via SUBCUTANEOUS
  Filled 2023-05-28: qty 22.5

## 2023-05-28 MED ORDER — ZOLEDRONIC ACID 4 MG/100ML IV SOLN
4.0000 mg | Freq: Once | INTRAVENOUS | Status: AC
Start: 2023-05-28 — End: 2023-05-28
  Administered 2023-05-28: 4 mg via INTRAVENOUS
  Filled 2023-05-28: qty 100

## 2023-05-28 NOTE — Patient Instructions (Signed)

## 2023-05-29 LAB — PSA, TOTAL AND FREE
PSA, Free Pct: 45.8 %
PSA, Free: 2.29 ng/mL
Prostate Specific Ag, Serum: 5 ng/mL — ABNORMAL HIGH (ref 0.0–4.0)

## 2023-06-10 ENCOUNTER — Encounter: Payer: Self-pay | Admitting: *Deleted

## 2023-06-11 NOTE — Congregational Nurse Program (Signed)
  Dept: [phone]   Congregational Nurse Program Note  Date of Encounter: 06/11/2023  Past Medical History: Past Medical History:  Diagnosis Date   Back pain    Hypertension    Prostate cancer [facility])     Encounter Details:  Community Questionnaire - 06/11/23 1321       Questionnaire   Ask client: Do you give verbal consent for me to treat you today? Yes    Student Assistance N/A    Location Patient Served  [facility]    Encounter Setting Phone/Text/Email    Population Status Migrant/Refugee    Insurance Uninsured ([employer]/Care Connects/Self-Pay/Medicaid Family Planning)    Insurance/Financial Assistance Referral N/A    Medication Have Medication Insecurities;Provided Medication Assistance;Patient Medications Reviewed    Medical Provider Yes    Screening Referrals Made N/A    Medical Referrals Made N/A    Medical Appointment Completed [facility] P[facility]    CNP Interventions Advocate/Support;Navigate Healthcare System;Counsel;Case Management;Educate    Screenings CN Performed N/A    ED Visit Averted Yes    Life-Saving Intervention Made N/A            Medication [name] was reordered by Ms Vanessa Kick RN and delivered to [facility]. Transportation scheduled for patient to pick up the medication from [facility]. Patient successfully picked up the medication. A bag of food given to patient as well.  Nicole Cella Akshaj Besancon RN BSN PCCN  [facility] [phone]-cell [phone]-office

## 2023-06-23 ENCOUNTER — Other Ambulatory Visit: Payer: Self-pay

## 2023-06-23 ENCOUNTER — Telehealth: Payer: Self-pay

## 2023-06-23 ENCOUNTER — Encounter: Payer: Self-pay | Admitting: *Deleted

## 2023-06-23 ENCOUNTER — Encounter: Payer: Self-pay | Admitting: Hematology

## 2023-06-23 DIAGNOSIS — C61 Malignant neoplasm of prostate: Secondary | ICD-10-CM

## 2023-06-23 NOTE — Telephone Encounter (Signed)
Patient contacted with appointment reminder.Transportation assistance will be provided.  Nicole Cella Kourtnei Rauber RN BSN PCCN  [facility] [phone]-cell [phone]-office

## 2023-06-24 ENCOUNTER — Other Ambulatory Visit: Payer: Self-pay

## 2023-06-24 ENCOUNTER — Telehealth: Payer: Self-pay

## 2023-06-24 ENCOUNTER — Encounter (HOSPITAL_COMMUNITY): Payer: Self-pay

## 2023-06-24 ENCOUNTER — Inpatient Hospital Stay: Payer: Self-pay

## 2023-06-24 ENCOUNTER — Inpatient Hospital Stay: Payer: BC Managed Care – PPO

## 2023-06-24 ENCOUNTER — Inpatient Hospital Stay (HOSPITAL_COMMUNITY)
Admission: EM | Admit: 2023-06-24 | Discharge: 2023-06-26 | DRG: 189 | Disposition: A | Discharge status: Home / Self Care | Payer: MEDICAID | Attending: Internal Medicine | Admitting: Internal Medicine

## 2023-06-24 ENCOUNTER — Emergency Department (HOSPITAL_COMMUNITY): Payer: Self-pay

## 2023-06-24 ENCOUNTER — Encounter: Payer: Self-pay | Admitting: Hematology

## 2023-06-24 ENCOUNTER — Other Ambulatory Visit (HOSPITAL_COMMUNITY): Payer: Self-pay

## 2023-06-24 ENCOUNTER — Inpatient Hospital Stay: Payer: Self-pay | Attending: Hematology

## 2023-06-24 VITALS — BP 143/93 | HR 84 | Temp 98.5°F | Resp 16 | Ht 69.0 in | Wt 128.5 lb

## 2023-06-24 DIAGNOSIS — C7951 Secondary malignant neoplasm of bone: Secondary | ICD-10-CM | POA: Insufficient documentation

## 2023-06-24 DIAGNOSIS — E43 Unspecified severe protein-calorie malnutrition: Secondary | ICD-10-CM | POA: Diagnosis present

## 2023-06-24 DIAGNOSIS — J9601 Acute respiratory failure with hypoxia: Principal | ICD-10-CM | POA: Diagnosis present

## 2023-06-24 DIAGNOSIS — J439 Emphysema, unspecified: Secondary | ICD-10-CM | POA: Diagnosis present

## 2023-06-24 DIAGNOSIS — R0602 Shortness of breath: Principal | ICD-10-CM | POA: Diagnosis present

## 2023-06-24 DIAGNOSIS — C61 Malignant neoplasm of prostate: Secondary | ICD-10-CM

## 2023-06-24 DIAGNOSIS — Z7901 Long term (current) use of anticoagulants: Secondary | ICD-10-CM

## 2023-06-24 DIAGNOSIS — I5032 Chronic diastolic (congestive) heart failure: Secondary | ICD-10-CM | POA: Diagnosis present

## 2023-06-24 DIAGNOSIS — G893 Neoplasm related pain (acute) (chronic): Secondary | ICD-10-CM

## 2023-06-24 DIAGNOSIS — Z87891 Personal history of nicotine dependence: Secondary | ICD-10-CM

## 2023-06-24 DIAGNOSIS — Z91148 Patient's other noncompliance with medication regimen for other reason: Secondary | ICD-10-CM

## 2023-06-24 DIAGNOSIS — R0902 Hypoxemia: Secondary | ICD-10-CM

## 2023-06-24 DIAGNOSIS — Z79899 Other long term (current) drug therapy: Secondary | ICD-10-CM

## 2023-06-24 DIAGNOSIS — Z86711 Personal history of pulmonary embolism: Secondary | ICD-10-CM

## 2023-06-24 DIAGNOSIS — I110 Hypertensive heart disease with heart failure: Secondary | ICD-10-CM | POA: Diagnosis present

## 2023-06-24 DIAGNOSIS — I10 Essential (primary) hypertension: Secondary | ICD-10-CM

## 2023-06-24 DIAGNOSIS — R54 Age-related physical debility: Secondary | ICD-10-CM | POA: Diagnosis present

## 2023-06-24 DIAGNOSIS — I700 Atherosclerosis of aorta: Secondary | ICD-10-CM | POA: Diagnosis present

## 2023-06-24 DIAGNOSIS — Z8546 Personal history of malignant neoplasm of prostate: Secondary | ICD-10-CM

## 2023-06-24 DIAGNOSIS — J441 Chronic obstructive pulmonary disease with (acute) exacerbation: Secondary | ICD-10-CM | POA: Diagnosis present

## 2023-06-24 DIAGNOSIS — Z681 Body mass index (BMI) 19 or less, adult: Secondary | ICD-10-CM

## 2023-06-24 DIAGNOSIS — I272 Pulmonary hypertension, unspecified: Secondary | ICD-10-CM | POA: Diagnosis present

## 2023-06-24 LAB — CMP (CANCER CENTER ONLY)
ALT: 8 U/L (ref 0–44)
AST: 18 U/L (ref 15–41)
Albumin: 3.8 g/dL (ref 3.5–5.0)
Alkaline Phosphatase: 63 U/L (ref 38–126)
Anion gap: 3 — ABNORMAL LOW (ref 5–15)
BUN: 13 mg/dL (ref 8–23)
CO2: 33 mmol/L — ABNORMAL HIGH (ref 22–32)
Calcium: 9.8 mg/dL (ref 8.9–10.3)
Chloride: 103 mmol/L (ref 98–111)
Creatinine: 0.73 mg/dL (ref 0.61–1.24)
GFR, Estimated: >60 mL/min (ref >60)
Glucose, Bld: 82 mg/dL (ref 70–99)
Potassium: 4 mmol/L (ref 3.5–5.1)
Sodium: 139 mmol/L (ref 135–145)
Total Bilirubin: 0.4 mg/dL (ref 0.0–1.2)
Total Protein: 8.6 g/dL — ABNORMAL HIGH (ref 6.5–8.1)

## 2023-06-24 LAB — BASIC METABOLIC PANEL WITH GFR
Anion gap: 6 (ref 5–15)
BUN: 13 mg/dL (ref 8–23)
CO2: 28 mmol/L (ref 22–32)
Calcium: 9 mg/dL (ref 8.9–10.3)
Chloride: 102 mmol/L (ref 98–111)
Creatinine, Ser: 0.78 mg/dL (ref 0.61–1.24)
GFR, Estimated: >60 mL/min (ref >60)
Glucose, Bld: 84 mg/dL (ref 70–99)
Potassium: 3.6 mmol/L (ref 3.5–5.1)
Sodium: 136 mmol/L (ref 135–145)

## 2023-06-24 LAB — CBC
HCT: 41.5 % (ref 39.0–52.0)
Hemoglobin: 13.4 g/dL (ref 13.0–17.0)
MCH: 28.5 pg (ref 26.0–34.0)
MCHC: 32.3 g/dL (ref 30.0–36.0)
MCV: 88.1 fL (ref 80.0–100.0)
Platelets: 185 10*3/uL (ref 150–400)
RBC: 4.71 MIL/uL (ref 4.22–5.81)
RDW: 14.1 % (ref 11.5–15.5)
WBC: 6.6 10*3/uL (ref 4.0–10.5)
nRBC: 0 % (ref 0.0–0.2)

## 2023-06-24 LAB — TROPONIN I (HIGH SENSITIVITY)
Troponin I (High Sensitivity): 13 ng/L (ref <18)
Troponin I (High Sensitivity): 14 ng/L (ref <18)

## 2023-06-24 LAB — CBC WITH DIFFERENTIAL (CANCER CENTER ONLY)
Abs Immature Granulocytes: 0.03 10*3/uL (ref 0.00–0.07)
Basophils Absolute: 0.1 10*3/uL (ref 0.0–0.1)
Basophils Relative: 1 %
Eosinophils Absolute: 0.1 10*3/uL (ref 0.0–0.5)
Eosinophils Relative: 2 %
HCT: 41 % (ref 39.0–52.0)
Hemoglobin: 13.5 g/dL (ref 13.0–17.0)
Immature Granulocytes: 1 %
Lymphocytes Relative: 54 %
Lymphs Abs: 3.5 10*3/uL (ref 0.7–4.0)
MCH: 28.1 pg (ref 26.0–34.0)
MCHC: 32.9 g/dL (ref 30.0–36.0)
MCV: 85.2 fL (ref 80.0–100.0)
Monocytes Absolute: 0.4 10*3/uL (ref 0.1–1.0)
Monocytes Relative: 6 %
Neutro Abs: 2.3 10*3/uL (ref 1.7–7.7)
Neutrophils Relative %: 36 %
Platelet Count: 192 10*3/uL (ref 150–400)
RBC: 4.81 MIL/uL (ref 4.22–5.81)
RDW: 14 % (ref 11.5–15.5)
WBC Count: 6.4 10*3/uL (ref 4.0–10.5)
nRBC: 0 % (ref 0.0–0.2)

## 2023-06-24 MED ORDER — IOHEXOL 350 MG/ML SOLN
75.0000 mL | Freq: Once | INTRAVENOUS | Status: AC | PRN
  Administered 2023-06-24: 75 mL via INTRAVENOUS

## 2023-06-24 MED ORDER — POLYETHYLENE GLYCOL 3350 17 G PO PACK: 17.0000 g | PACK | Freq: Every day | ORAL | Status: DC | PRN

## 2023-06-24 MED ORDER — OXYCODONE HCL 5 MG PO TABS
5.0000 mg | ORAL_TABLET | Freq: Three times a day (TID) | ORAL | Status: DC | PRN
  Administered 2023-06-24 – 2023-06-26 (×4): 5 mg via ORAL
  Filled 2023-06-24 (×4): qty 1

## 2023-06-24 MED ORDER — SODIUM CHLORIDE 0.9 % IV SOLN
500.0000 mg | INTRAVENOUS | Status: DC
  Administered 2023-06-25 – 2023-06-26 (×2): 500 mg via INTRAVENOUS
  Filled 2023-06-24 (×2): qty 5

## 2023-06-24 MED ORDER — SODIUM CHLORIDE 0.9 % IV SOLN: 100.0000 mg | Freq: Two times a day (BID) | INTRAVENOUS | Status: DC

## 2023-06-24 MED ORDER — APIXABAN 5 MG PO TABS
5.0000 mg | ORAL_TABLET | Freq: Two times a day (BID) | ORAL | Status: DC
  Administered 2023-06-24 – 2023-06-26 (×4): 5 mg via ORAL
  Filled 2023-06-24 (×4): qty 1

## 2023-06-24 MED ORDER — ACETAMINOPHEN 325 MG PO TABS: 650.0000 mg | ORAL_TABLET | Freq: Four times a day (QID) | ORAL | Status: DC | PRN

## 2023-06-24 MED ORDER — MELATONIN 5 MG PO TABS: 5.0000 mg | ORAL_TABLET | Freq: Every evening | ORAL | Status: DC | PRN

## 2023-06-24 MED ORDER — PROCHLORPERAZINE EDISYLATE 10 MG/2ML IJ SOLN: 5.0000 mg | Freq: Four times a day (QID) | INTRAMUSCULAR | Status: DC | PRN

## 2023-06-24 MED ORDER — ZOLEDRONIC ACID 4 MG/100ML IV SOLN
4.0000 mg | Freq: Once | INTRAVENOUS | Status: AC
  Administered 2023-06-24: 4 mg via INTRAVENOUS
  Filled 2023-06-24: qty 100

## 2023-06-24 MED ORDER — OXYCODONE HCL 5 MG PO TABS
5.0000 mg | ORAL_TABLET | Freq: Three times a day (TID) | ORAL | 0 refills | Status: DC | PRN
  Filled 2023-06-24: qty 60, 20d supply, fill #0

## 2023-06-24 MED ORDER — IPRATROPIUM-ALBUTEROL 0.5-2.5 (3) MG/3ML IN SOLN
3.0000 mL | Freq: Four times a day (QID) | RESPIRATORY_TRACT | Status: DC
  Administered 2023-06-25: 3 mL via RESPIRATORY_TRACT
  Filled 2023-06-24: qty 3

## 2023-06-24 MED ORDER — SODIUM CHLORIDE 0.9 % IV SOLN
1.0000 g | INTRAVENOUS | Status: DC
  Administered 2023-06-25: 1 g via INTRAVENOUS
  Filled 2023-06-24: qty 10

## 2023-06-24 MED ORDER — AMLODIPINE BESYLATE 10 MG PO TABS
10.0000 mg | ORAL_TABLET | Freq: Every day | ORAL | Status: DC
  Administered 2023-06-24 – 2023-06-25 (×2): 10 mg via ORAL
  Filled 2023-06-24 (×2): qty 1

## 2023-06-24 MED ORDER — GUAIFENESIN-DM 100-10 MG/5ML PO SYRP: 5.0000 mL | ORAL_SOLUTION | ORAL | Status: DC | PRN

## 2023-06-24 MED ORDER — ENZALUTAMIDE 80 MG PO TABS
160.0000 mg | ORAL_TABLET | Freq: Every day | ORAL | Status: DC
  Administered 2023-06-25 – 2023-06-26 (×2): 160 mg via ORAL
  Filled 2023-06-24 (×2): qty 2

## 2023-06-24 MED ORDER — METHYLPREDNISOLONE SODIUM SUCC 40 MG IJ SOLR
40.0000 mg | Freq: Every day | INTRAMUSCULAR | Status: DC
  Administered 2023-06-24: 40 mg via INTRAVENOUS
  Filled 2023-06-24: qty 1

## 2023-06-24 NOTE — ED Provider Triage Note (Signed)
 Emergency Medicine Provider Triage Evaluation Note  Arthur Ali , a 63 y.o. male  was evaluated in triage.  Pt complains of shortness of breath. Interpreter states that he developed SOB and CP after his cancer treatment today, about an hour ago. Has persisted since. Is on 2L oxygen now, which is new for him.  Review of Systems  Positive: SOB, CP Negative: N/V/D, fevers  Physical Exam  BP (!) 168/99   Pulse 88   Temp 97.8 F (36.6 C) (Oral)   Resp 14   Ht 5\' 9"  (1.753 m)   Wt 58 kg   SpO2 100%   BMI 18.88 kg/m  Gen:   Awake, alert Resp:  On 2L [place] MSK:   Moves extremities without difficulty  Other:    Medical Decision Making  Medically screening exam initiated at 3:26 PM.  Appropriate orders placed.  Arthur Ali was informed that the remainder of the evaluation will be completed by another provider, this initial triage assessment does not replace that evaluation, and the importance of remaining in the ED until their evaluation is complete.   Arthur Dusky, PA-C 06/24/23 1528

## 2023-06-24 NOTE — ED Notes (Signed)
 Admitting provider Dr. Del Favia at patient bedside.

## 2023-06-24 NOTE — Patient Instructions (Signed)

## 2023-06-24 NOTE — ED Notes (Signed)
 Patient transported to CT

## 2023-06-24 NOTE — Progress Notes (Addendum)
 Prior-To-Admission Oral Chemotherapy for Treatment of Oncologic Disease   Order noted from Dr. Del Favia to continue prior-to-admission oral chemotherapy regimen of Xtandi .  Procedure Per Pharmacy & Therapeutics Committee Policy: Orders for continuation of home oral chemotherapy for treatment of an oncologic disease will be held unless approved by an oncologist during current admission.  Orders received from Dr. Rosaline Coma this morning to continue Xtandi  during current hospitalization.   Ginnie Laine, PharmD, BCPS Clinical Pharmacist 06/25/2023  10:13 AM

## 2023-06-24 NOTE — ED Notes (Signed)
 On rounding, the patient was sitting in tripod position with arms on bed railing. Patient was short of breath, oxygen saturation was 89-90%, and was not connected to supplemental oxygen. This RN placed patient on 2L O2 via nasal cannula. Patient's oxygen increased to 93%. This RN notified Dr. Urban Garden and Arlyce Lambert, PA-C.

## 2023-06-24 NOTE — ED Notes (Signed)
 Patient returned to room from CT.

## 2023-06-24 NOTE — H&P (Addendum)
 History and Physical  Arthur Ali FAO:[id number] DOB: 1961/02/17 DOA: 06/24/2023  Referring physician: Jinny Mounts  PCP: Azell Boll, MD  Outpatient Specialists: Medical oncology, followed by Dr. Salomon Cree. Patient coming from: Home through cancer center.  Interview completed using audio interpreter Amudu [phone].  Chief Complaint: Shortness of breath, right-sided chest pain.  HPI: Arthur Ali is a 63 y.o. male with medical history significant for pulmonary embolism on Eliquis  (ran out of DOAC for 1 week), pulmonary hypertension seen on CT angio and echocardiogram, chronic HFpEF with LVEF 60-65%, grade 1 diastolic dysfunction, severe protein calorie malnutrition due to metastatic prostate cancer, cancer related pain, failure to thrive and generalized body pains, metastatic prostate cancer to the bone on Xtandi , hypertension, sent to [facility] ER from cancer center where he was receiving IV infusion for cancer related hypercalcemia ( zoledronic  acid injection).  The patient was noted to be significantly dyspneic with complaints of intermittent right-sided sharp chest pain radiating to his right arm.  Endorses onset of symptoms about a week ago and progressively worsening.  Associated with a productive cough.  Denies hemoptysis.  Admits to exposure to secondhand smoking when his friends visit him at his home.  The patient is a former smoker for 30+ years, quit tobacco use a few months ago.  In the ER, hypertensive, tachycardic and tachypneic.  Frail-appearing.  CT angio chest PE showed redemonstration of decrease in size right lower lobe central string-like segmental pulmonary embolus.  No definite acute pulmonary embolus identified.  Enlarged main pulmonary artery suggestive of pulmonary hypertension.  Stable paravertebral right upper lobe 5 mm pulmonary nodule.  Interval resolution of previously identified mediastinal lymphadenopathy.  Bronchitic changes with no associated bronchial  pneumonia.  Diffuse sclerotic axial and appendicular skeleton metastatic disease.  Aortic atherosclerosis and emphysema.  High-sensitivity troponin negative x 2.  No evidence of acute ischemia on twelve-lead EKG.  TRH, hospitalist service, was asked to admit for worsening dyspnea and chest pain.  ED Course: Temperature 98.4.  BP 158/98, pulse 81, respiration rate 20, O2 saturation 100% on 3 L from 88% on room air.   Review of Systems: Review of systems as noted in the HPI. All other systems reviewed and are negative.   Past Medical History:  Diagnosis Date   Back pain    Hypertension    Prostate cancer (HCC)    History reviewed. No pertinent surgical history.  Social History:  reports that he has quit smoking. His smoking use included cigars and cigarettes. He has never used smokeless tobacco. He reports that he does not drink alcohol and does not use drugs.   No Known Allergies  Family History  Family history unknown: Yes      Prior to Admission medications   Medication Sig Start Date End Date Taking? Authorizing Provider  acetaminophen  (TYLENOL ) 500 MG tablet Take 2 tablets (1,000 mg total) by mouth every 8 (eight) hours as needed. 12/07/22   Frankie Israel, MD  amLODipine  (NORVASC ) 10 MG tablet Take 1 tablet (10 mg total) by mouth at bedtime. 04/30/23   Frankie Israel, MD  apixaban  (ELIQUIS ) 5 MG TABS tablet Take 1 tablet (5 mg total) by mouth 2 (two) times daily. 04/30/23   Kale, Gautam Kishore, MD  enzalutamide  (XTANDI ) 80 MG tablet Take 2 tablets (160 mg total) by mouth daily. 05/06/23   Frankie Israel, MD  oxyCODONE  (OXY IR/ROXICODONE ) 5 MG immediate release tablet Take 1 tablet (5 mg total) by mouth every 8 (eight) hours as  needed for severe pain (pain score 7-10). 06/24/23   Thayil, Irene T, PA-C  polyethylene glycol powder (GLYCOLAX /MIRALAX ) 17 GM/SCOOP powder Take 17 g by mouth 2 (two) times daily as needed. 12/15/22   Everhart, Kirstie, DO  senna  (SENOKOT) 8.6 MG TABS tablet Take 1 tablet (8.6 mg total) by mouth daily. 03/23/23   Rumball, Alison M, DO  Vitamin D , Ergocalciferol , (DRISDOL ) 1.25 MG (50000 UNIT) CAPS capsule Take 1 capsule (50,000 Units total) by mouth every 7 (seven) days. 04/30/23   Frankie Israel, MD    Physical Exam: BP 126/79   Pulse 99   Temp 98.5 F (36.9 C) (Oral)   Resp 19   Ht 5\' 9"  (1.753 m)   Wt 58 kg   SpO2 97%   BMI 18.88 kg/m   General: 63 y.o. year-old male frail-appearing in no acute distress.  Alert and oriented x3. Cardiovascular: Regular rate and rhythm with no rubs or gallops.  No thyromegaly or JVD noted.  No lower extremity edema. 2/4 pulses in all 4 extremities. Respiratory: Mild wheezing bilaterally.  Mild rales at bases.  Poor inspiratory effort. Abdomen: Soft nontender nondistended with normal bowel sounds x4 quadrants. Muskuloskeletal: No cyanosis, clubbing or edema noted bilaterally Neuro: CN II-XII intact, strength, sensation, reflexes Skin: No ulcerative lesions noted or rashes Psychiatry: Judgement and insight appear normal. Mood is appropriate for condition and setting          Labs on Admission:  Basic Metabolic Panel: Recent Labs  Lab 06/24/23 1311 06/24/23 1512  NA 139 136  K 4.0 3.6  CL 103 102  CO2 33* 28  GLUCOSE 82 84  BUN 13 13  CREATININE 0.73 0.78  CALCIUM 9.8 9.0   Liver Function Tests: Recent Labs  Lab 06/24/23 1311  AST 18  ALT 8  ALKPHOS 63  BILITOT 0.4  PROT 8.6*  ALBUMIN 3.8   No results for input(s): "LIPASE", "AMYLASE" in the last 168 hours. No results for input(s): "AMMONIA" in the last 168 hours. CBC: Recent Labs  Lab 06/24/23 1311 06/24/23 1512  WBC 6.4 6.6  NEUTROABS 2.3  --   HGB 13.5 13.4  HCT 41.0 41.5  MCV 85.2 88.1  PLT 192 185   Cardiac Enzymes: No results for input(s): "CKTOTAL", "CKMB", "CKMBINDEX", "TROPONINI" in the last 168 hours.  BNP (last 3 results) Recent Labs    12/14/22 1628  BNP 69.5    ProBNP  (last 3 results) No results for input(s): "PROBNP" in the last 8760 hours.  CBG: No results for input(s): "GLUCAP" in the last 168 hours.  Radiological Exams on Admission: CT Angio Chest PE W and/or Wo Contrast Result Date: 06/24/2023 CLINICAL DATA:  Chest wall pain, nontraumatic, malignancy known or suspected, xray done EXAM: CT ANGIOGRAPHY CHEST WITH CONTRAST TECHNIQUE: Multidetector CT imaging of the chest was performed using the standard protocol during bolus administration of intravenous contrast. Multiplanar CT image reconstructions and MIPs were obtained to evaluate the vascular anatomy. RADIATION DOSE REDUCTION: This exam was performed according to the departmental dose-optimization program which includes automated exposure control, adjustment of the mA and/or kV according to patient size and/or use of iterative reconstruction technique. CONTRAST:  75mL OMNIPAQUE  IOHEXOL  350 MG/ML SOLN COMPARISON:  None Available. FINDINGS: Cardiovascular: Satisfactory opacification of the pulmonary arteries to the segmental level. The main pulmonary artery is mildly enlarged in caliber measuring up to 3.3 cm. Redemonstration of decreased in size right lower lobe central string-like segmental pulmonary embolus (10:268). No definite acute  pulmonary embolus identified. Normal heart size. No significant pericardial effusion. The thoracic aorta is normal in caliber. Mild atherosclerotic plaque of the thoracic aorta. No coronary artery calcifications. Mediastinum/Nodes: Interval resolution of previously identified mediastinal lymphadenopathy. Limited evaluation of the mediastinal lymph nodes due to timing of contrast. No enlarged mediastinal, hilar, or axillary lymph nodes. Thyroid  gland, trachea, and esophagus demonstrate no significant findings. Lungs/Pleura: Diffuse bronchial wall thickening. Severe emphysematous changes. No focal consolidation. Stable paravertebral right upper lobe 5 mm pulmonary nodule (2:44). No  pulmonary mass. No pleural effusion. No pneumothorax. Upper Abdomen: No acute abnormality. Musculoskeletal: No chest wall abnormality. Redemonstration of diffuse sclerotic axial and appendicular skeleton metastatic disease. No acute displaced fracture. Review of the MIP images confirms the above findings. IMPRESSION: 1. Redemonstration of decreased in size right lower lobe central string-like segmental pulmonary embolus. No definite acute pulmonary embolus identified. 2. Enlarged main pulmonary artery suggestive of pulmonary hypertension. 3. Stable paravertebral right upper lobe 5 mm pulmonary nodule. 4. Interval resolution of previously identified mediastinal lymphadenopathy. Limited evaluation of the mediastinal lymph nodes due to timing of contrast. 5. Bronchitic changes with no associated bronchopneumonia. 6. Diffuse sclerotic axial and appendicular skeleton metastatic disease. 7. Aortic Atherosclerosis (ICD10-I70.0) and Emphysema (ICD10-J43.9). Electronically Signed   By: Morgane  Naveau M.D.   On: 06/24/2023 21:19   DG Chest 2 View Result Date: 06/24/2023 CLINICAL DATA:  Chest pain. EXAM: CHEST - 2 VIEW COMPARISON:  Chest x-ray 04/01/2023. FINDINGS: The lungs are hyperinflated, unchanged. There is stable scarring in the right costophrenic angle. There is no pleural effusion or pneumothorax. No focal lung infiltrate identified. The cardiomediastinal silhouette is within normal limits. No acute fractures are seen. IMPRESSION: 1. No active cardiopulmonary disease. 2. COPD. Electronically Signed   By: Tyron Gallon M.D.   On: 06/24/2023 17:29    EKG: I independently viewed the EKG done and my findings are as followed: Sinus rhythm rate of 88.  Nonspecific ST-T changes.  QTc 470.  LVH.  Assessment/Plan Present on Admission:  Shortness of breath  Principal Problem:   Shortness of breath  Shortness of breath, progressively worsening Associated with persistent productive cough Concern for possible  early pneumonia versus presumed COPD with acute exacerbation Former tobacco user greater than 30+ years with current exposure to secondhand smoking at home Continue IV Solu-Medrol , bronchodilators, Rocephin  and azithromycin  Follow procalcitonin level. As needed antitussives Incentive spirometer Early mobilization  Concern for possible early bacterial pneumonia versus presumed COPD with acute exacerbation Management as stated above.  Acute hypoxic respiratory failure secondary to the above Not on oxygen supplementation at baseline Currently requiring 3 L to maintain O2 saturation above 92% Continue management as stated above Wean off oxygen supplementation as tolerated  Home O2 evaluation on 06/25/2023  Atypical chest pain History of pulmonary hypertension Endorses intermittent sharp right-sided chest pain radiating to his right lung Follow 2D echo Monitor on telemetry  History of pulmonary embolism, on Eliquis  with noncompliance Per the patient, he ran out of Eliquis  1 week ago Resume home Eliquis   Metastatic prostate cancer Follows with Dr. Salomon Cree  Severe protein calorie malnutrition Encourage increase in oral protein calorie intake as tolerated  Chronic HFpEF Last 2D echo done on 11/21/2022 revealed LVEF 60 to 65% with grade 1 diastolic dysfunction Euvolemic on exam Monitor strict I's and O's and daily weight  Generalized weakness PT OT evaluation Fall precautions   Time: 75 minutes.   DVT prophylaxis: Home Eliquis   Code Status: Full code.  Family Communication: None at  bedside.  Disposition Plan: Admitted to telemetry unit.  Consults called: None.  Admission status: Observation status.   Status is: Observation    Bary Boss MD [facility] Pager [phone]  If 7PM-7AM, please contact night-coverage [email or web address] Password [facility]  06/24/2023, 9:59 PM

## 2023-06-24 NOTE — ED Notes (Signed)
 Patient's O2 saturation decreased to 88% on room air. This RN notified Dr. Urban Garden.

## 2023-06-24 NOTE — ED Provider Notes (Signed)
 [place] EMERGENCY DEPARTMENT AT [facility] Provider Note   CSN: [id number] Arrival date & time: 06/24/23  1456     History  Chief Complaint  Patient presents with   Shortness of Breath    Arthur Ali is a 63 y.o. male.  Patient reported from the cancer center at [facility] long for chest pain and shortness of breath that continued after his IV infusion of Zometa.  He endorses this has been going on for the last week and has been getting progressively worse in that timeframe.  He also states that he has had persistent pain in the center of his chest that radiates to the right arm all the way to his hand.  He does not endorse any particular exacerbating factors, but states that he generally has been feeling worse in that time.  He denies any fever, or bodyaches or chills.  He has a medical history prostate cancer for which she is currently undergoing treatment.  In reviewing his previous medical history it is noted that he has metastatic cancer that is currently under treatment.  He states that he has been taking all prescription medications as prescribed on a regular basis and does not endorse any difficulty with access.  He denies any cough and denies any sputum.  He does feel generally weak, and generally unwell.   Shortness of Breath Associated symptoms: chest pain   Associated symptoms: no abdominal pain, no fever and no vomiting        Home Medications Prior to Admission medications   Medication Sig Start Date End Date Taking? Authorizing Provider  acetaminophen (TYLENOL) 500 MG tablet Take 2 tablets (1,000 mg total) by mouth every 8 (eight) hours as needed. 12/07/22   Frankie Israel, MD  amLODipine (NORVASC) 10 MG tablet Take 1 tablet (10 mg total) by mouth at bedtime. 04/30/23   Kale, Gautam Kishore, MD  apixaban (ELIQUIS) 5 MG TABS tablet Take 1 tablet (5 mg total) by mouth 2 (two) times daily. 04/30/23   Kale, Gautam Kishore, MD  enzalutamide Marvis Sluder) 80  MG tablet Take 2 tablets (160 mg total) by mouth daily. 05/06/23   Kale, Gautam Kishore, MD  oxyCODONE (OXY IR/ROXICODONE) 5 MG immediate release tablet Take 1 tablet (5 mg total) by mouth every 8 (eight) hours as needed for severe pain (pain score 7-10). 06/24/23   Thayil, Irene T, PA-C  polyethylene glycol powder (GLYCOLAX/MIRALAX) 17 GM/SCOOP powder Take 17 g by mouth 2 (two) times daily as needed. 12/15/22   Everhart, Kirstie, DO  senna (SENOKOT) 8.6 MG TABS tablet Take 1 tablet (8.6 mg total) by mouth daily. 03/23/23   Rumball, Alison M, DO  Vitamin D, Ergocalciferol, (DRISDOL) 1.25 MG (50000 UNIT) CAPS capsule Take 1 capsule (50,000 Units total) by mouth every 7 (seven) days. 04/30/23   Kale, Gautam Kishore, MD      Allergies    Patient has no known allergies.    Review of Systems   Review of Systems  Constitutional:  Negative for activity change, appetite change, chills, fatigue and fever.  Respiratory:  Positive for shortness of breath.   Cardiovascular:  Positive for chest pain. Negative for leg swelling.       Endorses substernal chest pain radiating up the right arm  Gastrointestinal:  Negative for abdominal distention, abdominal pain, nausea and vomiting.  Musculoskeletal:  Negative for arthralgias and myalgias.  Neurological:  Positive for weakness. Negative for dizziness and light-headedness.       Generalized weakness  and malaise.  Psychiatric/Behavioral:  Positive for dysphoric mood.     Physical Exam Updated Vital Signs BP (!) 168/99   Pulse 88   Temp 97.8 F (36.6 C) (Oral)   Resp 14   Ht 5\' 9"  (1.753 m)   Wt 58 kg   SpO2 100%   BMI 18.88 kg/m  Physical Exam Vitals and nursing note reviewed.  Constitutional:      General: He is not in acute distress.    Appearance: Normal appearance.  HENT:     Head: Normocephalic and atraumatic.     Mouth/Throat:     Mouth: Mucous membranes are moist.     Pharynx: Oropharynx is clear.  Eyes:     Extraocular Movements:  Extraocular movements intact.     Conjunctiva/sclera: Conjunctivae normal.     Pupils: Pupils are equal, round, and reactive to light.  Neck:     Thyroid: No thyromegaly.     Vascular: No JVD.  Cardiovascular:     Rate and Rhythm: Normal rate and regular rhythm.     Pulses: Normal pulses.     Heart sounds: Normal heart sounds. No murmur heard.    No friction rub. No gallop.  Pulmonary:     Effort: Pulmonary effort is normal.     Breath sounds: Examination of the right-lower field reveals decreased breath sounds. Decreased breath sounds present. No wheezing, rhonchi or rales.  Chest:     Chest wall: No deformity, tenderness or crepitus.  Abdominal:     General: Abdomen is flat. Bowel sounds are normal.     Palpations: Abdomen is soft.  Musculoskeletal:        General: Normal range of motion.     Cervical back: Normal range of motion and neck supple.     Right lower leg: No edema.     Left lower leg: No edema.  Lymphadenopathy:     Cervical: No cervical adenopathy.  Skin:    General: Skin is warm and dry.     Capillary Refill: Capillary refill takes less than 2 seconds.  Neurological:     General: No focal deficit present.     Mental Status: He is alert. Mental status is at baseline.  Psychiatric:        Mood and Affect: Mood normal.    ED Results / Procedures / Treatments   Labs (all labs ordered are listed, but only abnormal results are displayed) Labs Reviewed  CBC  BASIC METABOLIC PANEL WITH GFR  TROPONIN I (HIGH SENSITIVITY)    EKG None  Radiology No results found.  Procedures Procedures    Medications Ordered in ED Medications - No data to display  ED Course/ Medical Decision Making/ A&P Clinical Course as of 06/24/23 1721  Thu Jun 24, 2023  1642 Stable 73 YOM with a chief complaint of SOB Cancer center patient getting chemo infusion Right sided chest pain x1 week   [CC]    Clinical Course User Index [CC] Glyn Ade, MD                                  Medical Decision Making We reviewed physical exam and previous history, noted on course of pleural effusion on the right side.  Report x-ray from January 2025 and it is noted that pleural effusion is present at that time as well.  Does not appear to have increased in size or volume.  He  has a previous history of pulmonary embolism, and is currently taking Eliquis.  Reviewed all other lab findings with no remarkable findings noted.  Patient was ambulated with noted desaturation and increased dyspnea with ambulation.  At rest he has increased oxygen requirements and requires 3 L by nasal cannula to maintain normal oxygen saturations.  As a result of new onset increased oxygen requirement patient will require admission for further workup and management.  Amount and/or Complexity of Data Reviewed External Data Reviewed: labs and radiology. Labs: ordered. Decision-making details documented in ED Course. Radiology: ordered. Decision-making details documented in ED Course.  Risk Prescription drug management.           Final Clinical Impression(s) / ED Diagnoses Final diagnoses:  None    Rx / DC Orders ED Discharge Orders     None         Juanetta Nordmann, PA 06/24/23 2135    Onetha Bile, MD 06/24/23 2352

## 2023-06-24 NOTE — ED Triage Notes (Signed)
 Patient brought over from cancer center due to not being able to catch his breath after treatment. Had IV infusion Zometa today. Has right sided chest pain. No radiation. No nausea or vomiting.

## 2023-06-24 NOTE — Telephone Encounter (Signed)
Patient called with appointment reminder.Transportation assistance will be provided.  Nicole Cella Kemper Heupel RN BSN PCCN  [facility] [phone]-cell [phone]-office

## 2023-06-25 ENCOUNTER — Inpatient Hospital Stay: Payer: Self-pay

## 2023-06-25 ENCOUNTER — Observation Stay (HOSPITAL_COMMUNITY): Payer: Self-pay

## 2023-06-25 ENCOUNTER — Telehealth: Payer: Self-pay

## 2023-06-25 DIAGNOSIS — I700 Atherosclerosis of aorta: Secondary | ICD-10-CM | POA: Diagnosis present

## 2023-06-25 DIAGNOSIS — R54 Age-related physical debility: Secondary | ICD-10-CM | POA: Diagnosis present

## 2023-06-25 DIAGNOSIS — Z87891 Personal history of nicotine dependence: Secondary | ICD-10-CM | POA: Diagnosis not present

## 2023-06-25 DIAGNOSIS — C7951 Secondary malignant neoplasm of bone: Secondary | ICD-10-CM | POA: Diagnosis present

## 2023-06-25 DIAGNOSIS — I272 Pulmonary hypertension, unspecified: Secondary | ICD-10-CM | POA: Diagnosis present

## 2023-06-25 DIAGNOSIS — Z681 Body mass index (BMI) 19 or less, adult: Secondary | ICD-10-CM | POA: Diagnosis not present

## 2023-06-25 DIAGNOSIS — E43 Unspecified severe protein-calorie malnutrition: Secondary | ICD-10-CM | POA: Diagnosis present

## 2023-06-25 DIAGNOSIS — Z91148 Patient's other noncompliance with medication regimen for other reason: Secondary | ICD-10-CM | POA: Diagnosis not present

## 2023-06-25 DIAGNOSIS — J441 Chronic obstructive pulmonary disease with (acute) exacerbation: Secondary | ICD-10-CM | POA: Diagnosis present

## 2023-06-25 DIAGNOSIS — Z79899 Other long term (current) drug therapy: Secondary | ICD-10-CM | POA: Diagnosis not present

## 2023-06-25 DIAGNOSIS — R079 Chest pain, unspecified: Secondary | ICD-10-CM

## 2023-06-25 DIAGNOSIS — J9601 Acute respiratory failure with hypoxia: Secondary | ICD-10-CM | POA: Diagnosis present

## 2023-06-25 DIAGNOSIS — Z7901 Long term (current) use of anticoagulants: Secondary | ICD-10-CM | POA: Diagnosis not present

## 2023-06-25 DIAGNOSIS — J439 Emphysema, unspecified: Secondary | ICD-10-CM | POA: Diagnosis present

## 2023-06-25 DIAGNOSIS — I5032 Chronic diastolic (congestive) heart failure: Secondary | ICD-10-CM | POA: Diagnosis present

## 2023-06-25 DIAGNOSIS — Z8546 Personal history of malignant neoplasm of prostate: Secondary | ICD-10-CM | POA: Diagnosis not present

## 2023-06-25 DIAGNOSIS — Z86711 Personal history of pulmonary embolism: Secondary | ICD-10-CM | POA: Diagnosis not present

## 2023-06-25 DIAGNOSIS — I110 Hypertensive heart disease with heart failure: Secondary | ICD-10-CM | POA: Diagnosis present

## 2023-06-25 LAB — CBC
HCT: 41.4 % (ref 39.0–52.0)
Hemoglobin: 13 g/dL (ref 13.0–17.0)
MCH: 28.2 pg (ref 26.0–34.0)
MCHC: 31.4 g/dL (ref 30.0–36.0)
MCV: 89.8 fL (ref 80.0–100.0)
Platelets: 160 10*3/uL (ref 150–400)
RBC: 4.61 MIL/uL (ref 4.22–5.81)
RDW: 14.1 % (ref 11.5–15.5)
WBC: 7.1 10*3/uL (ref 4.0–10.5)
nRBC: 0 % (ref 0.0–0.2)

## 2023-06-25 LAB — BASIC METABOLIC PANEL WITH GFR
Anion gap: 10 (ref 5–15)
BUN: 12 mg/dL (ref 8–23)
CO2: 25 mmol/L (ref 22–32)
Calcium: 8.5 mg/dL — ABNORMAL LOW (ref 8.9–10.3)
Chloride: 103 mmol/L (ref 98–111)
Creatinine, Ser: 0.68 mg/dL (ref 0.61–1.24)
GFR, Estimated: >60 mL/min (ref >60)
Glucose, Bld: 94 mg/dL (ref 70–99)
Potassium: 3.7 mmol/L (ref 3.5–5.1)
Sodium: 138 mmol/L (ref 135–145)

## 2023-06-25 LAB — ECHOCARDIOGRAM COMPLETE
AR max vel: 2.67 cm2
AV Peak grad: 2.9 mmHg
Ao pk vel: 0.85 m/s
Area-P 1/2: 3.75 cm2
Height: 69 in
S' Lateral: 2.3 cm
Weight: 2010.6 [oz_av]

## 2023-06-25 LAB — PHOSPHORUS: Phosphorus: 4.1 mg/dL (ref 2.5–4.6)

## 2023-06-25 LAB — PROCALCITONIN: Procalcitonin: <0.1 ng/mL

## 2023-06-25 LAB — PSA, TOTAL AND FREE
PSA, Free Pct: 43.2 %
PSA, Free: 0.95 ng/mL
Prostate Specific Ag, Serum: 2.2 ng/mL (ref 0.0–4.0)

## 2023-06-25 LAB — MAGNESIUM: Magnesium: 1.9 mg/dL (ref 1.7–2.4)

## 2023-06-25 LAB — BRAIN NATRIURETIC PEPTIDE: B Natriuretic Peptide: 106.8 pg/mL — ABNORMAL HIGH (ref 0.0–100.0)

## 2023-06-25 MED ORDER — ALBUTEROL SULFATE (2.5 MG/3ML) 0.083% IN NEBU: 2.5000 mg | INHALATION_SOLUTION | RESPIRATORY_TRACT | Status: DC | PRN

## 2023-06-25 MED ORDER — FLUTICASONE FUROATE-VILANTEROL 200-25 MCG/ACT IN AEPB
1.0000 | INHALATION_SPRAY | Freq: Every day | RESPIRATORY_TRACT | Status: DC
  Administered 2023-06-25 – 2023-06-26 (×2): 1 via RESPIRATORY_TRACT
  Filled 2023-06-25: qty 28

## 2023-06-25 MED ORDER — IPRATROPIUM-ALBUTEROL 0.5-2.5 (3) MG/3ML IN SOLN
3.0000 mL | Freq: Three times a day (TID) | RESPIRATORY_TRACT | Status: DC
  Administered 2023-06-25 – 2023-06-26 (×4): 3 mL via RESPIRATORY_TRACT
  Filled 2023-06-25 (×4): qty 3

## 2023-06-25 MED ORDER — METHYLPREDNISOLONE SODIUM SUCC 125 MG IJ SOLR
80.0000 mg | Freq: Every day | INTRAMUSCULAR | Status: AC
  Administered 2023-06-25 – 2023-06-26 (×2): 80 mg via INTRAVENOUS
  Filled 2023-06-25 (×2): qty 2

## 2023-06-25 NOTE — Evaluation (Signed)
 Occupational Therapy Evaluation Patient Details Name: Arthur Ali MRN: [id number] DOB: 1961-02-26 Today's Date: 06/25/2023   History of Present Illness   63 yr old male  presented  06/24/23 with shortness of breath and right-sided chest pain from cancer center. On presentation, he was hypertensive, tachycardic and tachypneic. Past medical history of pulmonary embolism, pulmonary hypertension, chronic HFpEF with LVEF 60-65%, grade 1 diastolic dysfunction, severe protein calorie malnutrition due to metastatic prostate cancer with mets to the bone, cancer related pain, failure to thrive and generalized body pains, hypertension.     Clinical Impressions The pt reported living alone and having no family support available. During the session today, he required SBA to CGA for most tasks, including sit to stand, simulated dressing, and ambulating in the hall without an assistive device. He reported 7/10 chest pain, feelings of general weakness, and he presented with sudden onset of dizziness while ambulating in the hall; he subsequently needed to cease activity and be wheeled back to his room in a chair. He reported improvement in his symptoms after a short duration. His vitals were noted to be 97% on room air with activity/ambulation, heart rate 74 bpm after performing supine to sit, and blood pressure 164/95 after he was returned to the room at the end of the session. Anticipate he will only require 1-2 follow-up OT visits in the hospital setting, and no post-hospital therapy needs.      If plan is discharge home, recommend the following:   Assist for transportation     Functional Status Assessment   Patient has had a recent decline in their functional status and demonstrates the ability to make significant improvements in function in a reasonable and predictable amount of time.     Equipment Recommendations   None recommended by OT     Recommendations for Other Services          Precautions/Restrictions   Restrictions Weight Bearing Restrictions Per Provider Order: No Other Position/Activity Restrictions: monitors sats     Mobility Bed Mobility Overal bed mobility: Modified Independent                  Transfers Overall transfer level: Needs assistance Equipment used: None Transfers: Sit to/from Stand Sit to Stand: Supervision                  Balance     Sitting balance-Leahy Scale: Good       Standing balance-Leahy Scale: Good                ADL either performed or assessed with clinical judgement   ADL Overall ADL's : Needs assistance/impaired Eating/Feeding: Independent;Sitting   Grooming: Set up;Sitting           Upper Body Dressing : Set up;Sitting   Lower Body Dressing: Set up;Sitting/lateral leans                        Pertinent Vitals/Pain Pain Assessment Pain Assessment: 0-10 Pain Score: 7  Pain Location: chest Pain Intervention(s): Monitored during session     Extremity/Trunk Assessment Upper Extremity Assessment Upper Extremity Assessment: Overall WFL for tasks assessed;Right hand dominant   Lower Extremity Assessment Lower Extremity Assessment: Overall WFL for tasks assessed     Communication Communication Communication: No apparent difficulties Factors Affecting Communication: Other (comment) (Non-English speaking. Speaks Kinyarwanda. Video interpreter used. Interpreter name Joselyne, ID [id number])   Cognition Arousal: Alert Behavior During Therapy: WFL for tasks assessed/performed Cognition: No apparent  impairments          Following commands: Intact                  Home Living Family/patient expects to be discharged to:: Private residence Living Arrangements: Alone   Type of Home: House Home Access: Level entry     Home Layout: One level     Bathroom Shower/Tub: Tub/shower unit;Walk-in shower   Bathroom Toilet: Standard     Home Equipment:  Wheelchair - manual          Prior Functioning/Environment Prior Level of Function : Working/employed             Mobility Comments:  (Independent with ambulation.) ADLs Comments:  (Independent with ADLs, does not drive, and hasn't worked in ~8 months.)    OT Problem List: Decreased activity tolerance;Decreased strength;Decreased knowledge of use of DME or AE;Pain   OT Treatment/Interventions: Self-care/ADL training;Therapeutic exercise;Therapeutic activities;Energy conservation;Patient/family education;DME and/or AE instruction      OT Goals(Current goals can be found in the care plan section)   Acute Rehab OT Goals OT Goal Formulation: With patient Time For Goal Achievement: 07/09/23 Potential to Achieve Goals: Good ADL Goals Pt Will Perform Grooming: with modified independence;standing Pt Will Transfer to Toilet: with modified independence;ambulating Pt Will Perform Toileting - Clothing Manipulation and hygiene: with modified independence;sit to/from stand   OT Frequency:  Min 1X/week    Co-evaluation PT/OT/SLP Co-Evaluation/Treatment: Yes Reason for Co-Treatment: To address functional/ADL transfers;For patient/therapist safety PT goals addressed during session: Mobility/safety with mobility OT goals addressed during session: ADL's and self-care      AM-PAC OT "6 Clicks" Daily Activity     Outcome Measure Help from another person eating meals?: None Help from another person taking care of personal grooming?: None Help from another person toileting, which includes using toliet, bedpan, or urinal?: A Little Help from another person bathing (including washing, rinsing, drying)?: A Little Help from another person to put on and taking off regular upper body clothing?: None Help from another person to put on and taking off regular lower body clothing?: None 6 Click Score: 22   End of Session Equipment Utilized During Treatment: Gait belt Nurse Communication:  Mobility status  Activity Tolerance: Other (comment) (Limited by sudden dizziness) Patient left: in bed;with call bell/phone within reach;with bed alarm set  OT Visit Diagnosis: Unsteadiness on feet (R26.81);Pain;Muscle weakness (generalized) (M62.81) Pain - part of body:  (chest)                Time: 1319-1340 OT Time Calculation (min): 21 min Charges:  OT General Charges $OT Visit: 1 Visit OT Evaluation $OT Eval Moderate Complexity: 1 Mod   Arthur Ali L Arthur Ali, OTR/L 06/25/2023, 2:51 PM

## 2023-06-25 NOTE — Congregational Nurse Program (Signed)
  Dept: [phone]   Congregational Nurse Program Note  Date of Encounter: 06/25/2023  Past Medical History: Past Medical History:  Diagnosis Date   Back pain    Hypertension    Prostate cancer [facility])     Encounter Details:  Community Questionnaire - 06/25/23 0857       Questionnaire   Ask client: Do you give verbal consent for me to treat you today? Yes    Student Assistance N/A    [facility] Patient Served  [facility]    Encounter Setting Phone/Text/Email;Hospital    Population Status Migrant/Refugee    Insurance Uninsured ([employer]/Care Connects/Self-Pay/Medicaid Family Planning)    Insurance/Financial Assistance Referral Charitable Care    Medication Have Medication Insecurities    Medical Provider Yes    Screening Referrals Made N/A    Medical Referrals Made N/A    Medical Appointment Completed [facility]    CNP Interventions Advocate/Support;Navigate Healthcare System;Counsel;Case Management;Educate    Screenings CN Performed N/A    ED Visit Averted N/A    Life-Saving Intervention Made N/A           Patient is currently admitted. I have called him on phone to offer support.He has not been offered breakfast yet. Nutritional services department contacted and patient put on automatic trays. Congregational Nurses program will provide transportation home upon discharge.  Perla Bradford Maylee Bare RN BSN PCCN  [facility] [phone]-cell [phone]-office

## 2023-06-25 NOTE — Progress Notes (Signed)
 PROGRESS NOTE    Arthur Ali  FMW:[id number] DOB: 12/31/1960 DOA: 06/24/2023 PCP: Delores Suzann HERO, MD   Brief Narrative:   63 y.o. male with medical history significant for pulmonary embolism on Eliquis  (ran out of DOAC for 1 week), pulmonary hypertension seen on CT angio and echocardiogram, chronic HFpEF with LVEF 60-65%, grade 1 diastolic dysfunction, severe protein calorie malnutrition due to metastatic prostate cancer, cancer related pain, failure to thrive and generalized body pains, metastatic prostate cancer to the bone on Xtandi , hypertension presented with shortness of breath and right-sided chest pain and was referred to the ED from cancer center.  On presentation, he was hypertensive, tachycardic and tachypneic.  CT angio chest PE showed redemonstration of decrease in size of right lower lobe central string-like segmental pulmonary embolism with bronchitic changes with no associated bronchial pneumonia.  High-sensitivity troponins x 2 were negative.  He required supplemental oxygen.  He was started on IV Solu-Medrol  along with nebs.  Assessment & Plan:   Acute respiratory failure with hypoxia Possible acute COPD exacerbation Bacterial pneumonia has been ruled out - Presented with worsening shortness of breath and imaging was as above.  Procalcitonin less than 0.1.  Doubt that he has bacterial pneumonia.  DC Rocephin .  Continue Zithromax  for COPD exacerbation -Still requiring supplemental oxygen.  Wean off as able.  Does not feel well yet.  Increase Solu-Medrol  to 80 mg IV every 12 hours.  Add Breo Ellipta .  Continue nebs.  Outpatient follow-up with pulmonary.   History of pulmonary embolism - Ran out of Eliquis  a week ago.  Imaging as above.  Continue Eliquis .  Atypical chest pain History of pulmonary hypertension -High sensitive troponins x 2 were negative.  EKG was nonischemic.  Follow 2D echo.  Metastatic prostate cancer - Continue Xtandi .  Outpatient follow-up with Dr.  Kale/oncology  Severe protein calorie malnutrition - Nutrition consult  Chronic diastolic heart failure Hypertension - Currently compensated.  Strict input and output.  Daily weights.  Last 2D echo done on 11/21/2022 showed EF of 60 to 65% with grade 1 diastolic dysfunction.  Outpatient follow-up with cardiology.  Continue amlodipine   Physical deconditioning/generalized weakness -PT/OT eval   DVT prophylaxis: Eliquis  Code Status: Full Family Communication: Spoke to friend on phone who translated for the patient Disposition Plan: Status is: Observation The patient will require care spanning > 2 midnights and should be moved to inpatient because: Of severity of illness.    Consultants: None  Procedures: None  Antimicrobials: Rocephin  and Zithromax  from 06/24/2023 onwards   Subjective: Patient seen and examined at bedside.  Feels slightly better but still short of breath with exertion and does not feel ready to go home today.  Denies any chest pain currently.  No fever or vomiting reported.  Patient's friend translated for the patient on phone.  Objective: Vitals:   06/24/23 2351 06/25/23 0358 06/25/23 0805 06/25/23 0810  BP: (!) 161/96 (!) 124/97  131/79  Pulse:  77  80  Resp:  14  20  Temp:  97.8 F (36.6 C)  98 F (36.7 C)  TempSrc:  Oral  Oral  SpO2:  100% 100% 100%  Weight:      Height:        Intake/Output Summary (Last 24 hours) at 06/25/2023 1015 Last data filed at 06/25/2023 0400 Gross per 24 hour  Intake --  Output 450 ml  Net -450 ml   Filed Weights   06/24/23 1500 06/24/23 2309  Weight: 58 kg 57 kg  Examination:  General exam: Appears calm and comfortable.  On 2 L oxygen via nasal cannula. Respiratory system: Bilateral decreased breath sounds at bases with scattered crackles Cardiovascular system: S1 & S2 heard, Rate controlled Gastrointestinal system: Abdomen is nondistended, soft and nontender. Normal bowel sounds heard. Extremities: No  cyanosis, clubbing, edema  Central nervous system: Alert and oriented.  Slow to respond.  No focal neurological deficits. Moving extremities Skin: No rashes, lesions or ulcers Psychiatry: Flat affect.  Not agitated.   Data Reviewed: I have personally reviewed following labs and imaging studies  CBC: Recent Labs  Lab 06/24/23 1311 06/24/23 1512 06/25/23 0031  WBC 6.4 6.6 7.1  NEUTROABS 2.3  --   --   HGB 13.5 13.4 13.0  HCT 41.0 41.5 41.4  MCV 85.2 88.1 89.8  PLT 192 185 160   Basic Metabolic Panel: Recent Labs  Lab 06/24/23 1311 06/24/23 1512 06/25/23 0031  NA 139 136 138  K 4.0 3.6 3.7  CL 103 102 103  CO2 33* 28 25  GLUCOSE 82 84 94  BUN 13 13 12   CREATININE 0.73 0.78 0.68  CALCIUM 9.8 9.0 8.5*  MG  --   --  1.9  PHOS  --   --  4.1   GFR: Estimated Creatinine Clearance: 74.2 mL/min (by C-G formula based on SCr of 0.68 mg/dL). Liver Function Tests: Recent Labs  Lab 06/24/23 1311  AST 18  ALT 8  ALKPHOS 63  BILITOT 0.4  PROT 8.6*  ALBUMIN 3.8   No results for input(s): LIPASE, AMYLASE in the last 168 hours. No results for input(s): AMMONIA in the last 168 hours. Coagulation Profile: No results for input(s): INR, PROTIME in the last 168 hours. Cardiac Enzymes: No results for input(s): CKTOTAL, CKMB, CKMBINDEX, TROPONINI in the last 168 hours. BNP (last 3 results) No results for input(s): PROBNP in the last 8760 hours. HbA1C: No results for input(s): HGBA1C in the last 72 hours. CBG: No results for input(s): GLUCAP in the last 168 hours. Lipid Profile: No results for input(s): CHOL, HDL, LDLCALC, TRIG, CHOLHDL, LDLDIRECT in the last 72 hours. Thyroid  Function Tests: No results for input(s): TSH, T4TOTAL, FREET4, T3FREE, THYROIDAB in the last 72 hours. Anemia Panel: No results for input(s): VITAMINB12, FOLATE, FERRITIN, TIBC, IRON, RETICCTPCT in the last 72 hours. Sepsis Labs: Recent Labs   Lab 06/25/23 0031  PROCALCITON <0.10    No results found for this or any previous visit (from the past 240 hours).       Radiology Studies: CT Angio Chest PE W and/or Wo Contrast Result Date: 06/24/2023 CLINICAL DATA:  Chest wall pain, nontraumatic, malignancy known or suspected, xray done EXAM: CT ANGIOGRAPHY CHEST WITH CONTRAST TECHNIQUE: Multidetector CT imaging of the chest was performed using the standard protocol during bolus administration of intravenous contrast. Multiplanar CT image reconstructions and MIPs were obtained to evaluate the vascular anatomy. RADIATION DOSE REDUCTION: This exam was performed according to the departmental dose-optimization program which includes automated exposure control, adjustment of the mA and/or kV according to patient size and/or use of iterative reconstruction technique. CONTRAST:  75mL OMNIPAQUE  IOHEXOL  350 MG/ML SOLN COMPARISON:  None Available. FINDINGS: Cardiovascular: Satisfactory opacification of the pulmonary arteries to the segmental level. The main pulmonary artery is mildly enlarged in caliber measuring up to 3.3 cm. Redemonstration of decreased in size right lower lobe central string-like segmental pulmonary embolus (10:268). No definite acute pulmonary embolus identified. Normal heart size. No significant pericardial effusion. The thoracic aorta is normal  in caliber. Mild atherosclerotic plaque of the thoracic aorta. No coronary artery calcifications. Mediastinum/Nodes: Interval resolution of previously identified mediastinal lymphadenopathy. Limited evaluation of the mediastinal lymph nodes due to timing of contrast. No enlarged mediastinal, hilar, or axillary lymph nodes. Thyroid  gland, trachea, and esophagus demonstrate no significant findings. Lungs/Pleura: Diffuse bronchial wall thickening. Severe emphysematous changes. No focal consolidation. Stable paravertebral right upper lobe 5 mm pulmonary nodule (2:44). No pulmonary mass. No pleural  effusion. No pneumothorax. Upper Abdomen: No acute abnormality. Musculoskeletal: No chest wall abnormality. Redemonstration of diffuse sclerotic axial and appendicular skeleton metastatic disease. No acute displaced fracture. Review of the MIP images confirms the above findings. IMPRESSION: 1. Redemonstration of decreased in size right lower lobe central string-like segmental pulmonary embolus. No definite acute pulmonary embolus identified. 2. Enlarged main pulmonary artery suggestive of pulmonary hypertension. 3. Stable paravertebral right upper lobe 5 mm pulmonary nodule. 4. Interval resolution of previously identified mediastinal lymphadenopathy. Limited evaluation of the mediastinal lymph nodes due to timing of contrast. 5. Bronchitic changes with no associated bronchopneumonia. 6. Diffuse sclerotic axial and appendicular skeleton metastatic disease. 7. Aortic Atherosclerosis (ICD10-I70.0) and Emphysema (ICD10-J43.9). Electronically Signed   By: Morgane  Naveau M.D.   On: 06/24/2023 21:19   DG Chest 2 View Result Date: 06/24/2023 CLINICAL DATA:  Chest pain. EXAM: CHEST - 2 VIEW COMPARISON:  Chest x-ray 04/01/2023. FINDINGS: The lungs are hyperinflated, unchanged. There is stable scarring in the right costophrenic angle. There is no pleural effusion or pneumothorax. No focal lung infiltrate identified. The cardiomediastinal silhouette is within normal limits. No acute fractures are seen. IMPRESSION: 1. No active cardiopulmonary disease. 2. COPD. Electronically Signed   By: Greig Pique M.D.   On: 06/24/2023 17:29        Scheduled Meds:  amLODipine   10 mg Oral QHS   apixaban   5 mg Oral BID   enzalutamide   160 mg Oral Daily   fluticasone  furoate-vilanterol  1 puff Inhalation Daily   ipratropium-albuterol   3 mL Nebulization TID   methylPREDNISolone  (SOLU-MEDROL ) injection  80 mg Intravenous Daily   Continuous Infusions:  azithromycin  500 mg (06/25/23 0058)   cefTRIAXone  (ROCEPHIN )  IV 1 g  (06/25/23 0000)          Sophie Mao, MD [facility] 06/25/2023, 10:15 AM

## 2023-06-25 NOTE — Progress Notes (Signed)
 PT Cancellation Note  Patient Details Name: Arthur Ali MRN: [id number] DOB: 1960-11-02   Cancelled Treatment:    Reason Eval/Treat Not Completed: Patient at procedure or test/unavailable  Will check back another time. Darice Potters PT Acute Rehabilitation Services Office [phone]   Potters Darice Norris 06/25/2023, 11:46 AM

## 2023-06-25 NOTE — Telephone Encounter (Signed)
 I have contacted patient over the phone to offer support during hospital admission.He reports to be feeling a bit better but still experiencing dyspnea on exertion. Patient would like a soda, I have directed him to press the call button and say "soda" successfully.He is getting meal trays and has water by the bedside. He denies pain at this time. Educated to  press the call button when he needs assistance. He verbalized understanding. I will continue to assist  him as needed.  Perla Bradford Marlowe Lawes RN BSN PCCN  [facility] [phone]-cell [phone]-office

## 2023-06-25 NOTE — Progress Notes (Signed)
  Echocardiogram 2D Echocardiogram has been performed.  Arthur Ali 06/25/2023, 12:20 PM

## 2023-06-25 NOTE — Evaluation (Signed)
 Physical Therapy Evaluation Patient Details Name: Arthur Ali MRN: [id number] DOB: 1960-07-24 Today's Date: 06/25/2023  History of Present Illness  63 y.o. male  presented  06/24/23 with shortness of breath and right-sided chest pain from cancer center. On presentation, he was hypertensive, tachycardic and tachypneic. CT angio chest PE showed redemonstration of decrease in size of right lower lobe central string-like segmental pulmonary. with medical history significant for pulmonary embolism on Eliquis  , pulmonary hypertension, chronic HFpEF with LVEF 60-65%, grade 1 diastolic dysfunction, severe protein calorie malnutrition due to metastatic prostate cancer with mets to the bone, cancer related pain, failure to thrive and generalized body pains, hypertension.  Clinical Impression  Pt admitted with above diagnosis.  Pt currently with functional limitations due to the deficits listed below (see PT Problem List). Pt will benefit from acute skilled PT to increase their independence and safety with mobility to allow discharge.     The patient    reports lives alone with no other support. Has not been able to work.  Patient reports having chest pain. BP 144/73 after ambulating x 50' , no device,and reporting feeling weak. A chair was brought up and patient rolled back to room, back into bed. SPO2 on RA 99%.    Hopefully patient will improved  and be able to return home with limited support.       If plan is discharge home, recommend the following: Assist for transportation;Assistance with cooking/housework;Help with stairs or ramp for entrance   Can travel by private vehicle        Equipment Recommendations  none  Recommendations for Other Services       Functional Status Assessment Patient has had a recent decline in their functional status and demonstrates the ability to make significant improvements in function in a reasonable and predictable amount of time.     Precautions /  Restrictions Precautions Precautions: Fall Precaution/Restrictions Comments: monito SATS/ BP, weakness Restrictions Weight Bearing Restrictions Per Provider Order: No      Mobility  Bed Mobility Overal bed mobility: Modified Independent                  Transfers Overall transfer level: Needs assistance Equipment used: None               General transfer comment: mildly unsteady upon standing    Ambulation/Gait Ambulation/Gait assistance: Min assist Gait Distance (Feet): 50 Feet Assistive device: None Gait Pattern/deviations: Step-through pattern Gait velocity: decr     General Gait Details: patient   appeared unsteady, appeared to have change in facial, reported feeling weaker. chair brought up and VS taken.  Stairs            Wheelchair Mobility     Tilt Bed    Modified Rankin (Stroke Patients Only)       Balance Overall balance assessment: (P) Needs assistance   Sitting balance-Leahy Scale: (P) Good     Standing balance support: (P) No upper extremity supported, During functional activity Standing balance-Leahy Scale: (P) Fair                               Pertinent Vitals/Pain Pain Assessment Pain Assessment: 0-10 Pain Score: 7  Pain Location: chest Pain Descriptors / Indicators: Aching, Discomfort Pain Intervention(s): Monitored during session, Premedicated before session    Home Living Family/patient expects to be discharged to:: Private residence Living Arrangements: Alone   Type of Home: Apartment (pt  stated house, [address] is apt) Home Access: Level entry       Home Layout: One level Home Equipment: Wheelchair - manual      Prior Function Prior Level of Function : Working/employed             Mobility Comments: does not use AD, has not worked in 8 months       Extremity/Trunk Assessment   Upper Extremity Assessment Upper Extremity Assessment: (P) Right hand dominant    Lower Extremity  Assessment Lower Extremity Assessment: Generalized weakness    Cervical / Trunk Assessment Cervical / Trunk Assessment: Normal  Communication   Communication Communication: No apparent difficulties Factors Affecting Communication: Non - English speaking, interpreter not available    Cognition Arousal: Alert Behavior During Therapy: WFL for tasks assessed/performed   PT - Cognitive impairments: No apparent impairments                       PT - Cognition Comments: utilized ipad interpreter Following commands: Intact       Cueing       General Comments      Exercises     Assessment/Plan    PT Assessment Patient needs continued PT services  PT Problem List Decreased strength;Decreased mobility;Decreased activity tolerance;Decreased balance;Pain       PT Treatment Interventions DME instruction;Therapeutic exercise;Gait training;Functional mobility training;Therapeutic activities;Patient/family education    PT Goals (Current goals can be found in the Care Plan section)  Acute Rehab PT Goals Patient Stated Goal: agreed to eval PT Goal Formulation: With patient Time For Goal Achievement: 07/09/23 Potential to Achieve Goals: Fair    Frequency Min 3X/week     Co-evaluation PT/OT/SLP Co-Evaluation/Treatment: Yes Reason for Co-Treatment: For patient/therapist safety PT goals addressed during session: Mobility/safety with mobility OT goals addressed during session: ADL's and self-care       AM-PAC PT "6 Clicks" Mobility  Outcome Measure Help needed turning from your back to your side while in a flat bed without using bedrails?: None Help needed moving from lying on your back to sitting on the side of a flat bed without using bedrails?: None Help needed moving to and from a bed to a chair (including a wheelchair)?: A Little Help needed standing up from a chair using your arms (e.g., wheelchair or bedside chair)?: A Little Help needed to walk in hospital  room?: A Little Help needed climbing 3-5 steps with a railing? : A Lot 6 Click Score: 19    End of Session   Activity Tolerance: Patient limited by fatigue Patient left: in bed;with call bell/phone within reach Nurse Communication: Mobility status PT Visit Diagnosis: Unsteadiness on feet (R26.81);Muscle weakness (generalized) (M62.81)    Time: 1610-9604 PT Time Calculation (min) (ACUTE ONLY): 21 min   Charges:   PT Evaluation $PT Eval Low Complexity: 1 Low   PT General Charges $$ ACUTE PT VISIT: 1 Visit         Abelina Hoes PT Acute Rehabilitation Services Office [phone]   Dareen Ebbing 06/25/2023, 2:09 PM

## 2023-06-25 NOTE — Plan of Care (Signed)
°  Problem: Clinical Measurements: Goal: Will remain free from infection Outcome: Progressing   Problem: Activity: Goal: Risk for activity intolerance will decrease Outcome: Progressing   Problem: Elimination: Goal: Will not experience complications related to bowel motility Outcome: Progressing Goal: Will not experience complications related to urinary retention Outcome: Progressing   Problem: Safety: Goal: Ability to remain free from injury will improve Outcome: Progressing

## 2023-06-26 ENCOUNTER — Other Ambulatory Visit (HOSPITAL_COMMUNITY): Payer: Self-pay

## 2023-06-26 ENCOUNTER — Encounter: Payer: Self-pay | Admitting: Hematology

## 2023-06-26 MED ORDER — PREDNISONE 20 MG PO TABS: 40.0000 mg | ORAL_TABLET | Freq: Every day | ORAL | 0 refills | Status: DC

## 2023-06-26 MED ORDER — METHYLPREDNISOLONE SODIUM SUCC 125 MG IJ SOLR: 80.0000 mg | Freq: Once | INTRAMUSCULAR | Status: DC

## 2023-06-26 MED ORDER — ALBUTEROL SULFATE HFA 108 (90 BASE) MCG/ACT IN AERS: 2.0000 | INHALATION_SPRAY | RESPIRATORY_TRACT | 0 refills | Status: DC | PRN

## 2023-06-26 MED ORDER — BUDESONIDE-FORMOTEROL FUMARATE 160-4.5 MCG/ACT IN AERO: 2.0000 | INHALATION_SPRAY | Freq: Two times a day (BID) | RESPIRATORY_TRACT | 0 refills | Status: DC

## 2023-06-26 MED ORDER — AMLODIPINE BESYLATE 10 MG PO TABS
10.0000 mg | ORAL_TABLET | Freq: Every day | ORAL | 0 refills | Status: DC
  Filled 2023-06-26: qty 30, 30d supply, fill #0

## 2023-06-26 MED ORDER — GUAIFENESIN-DM 100-10 MG/5ML PO SYRP
10.0000 mL | ORAL_SOLUTION | ORAL | 0 refills | Status: AC | PRN
Start: 2023-06-26 — End: ?
  Filled 2023-06-26: qty 236, 4d supply, fill #0

## 2023-06-26 MED ORDER — GUAIFENESIN-DM 100-10 MG/5ML PO SYRP: 10.0000 mL | ORAL_SOLUTION | ORAL | 0 refills | Status: DC | PRN

## 2023-06-26 MED ORDER — APIXABAN 5 MG PO TABS: 5.0000 mg | ORAL_TABLET | Freq: Two times a day (BID) | ORAL | 0 refills | Status: DC

## 2023-06-26 MED ORDER — AMLODIPINE BESYLATE 10 MG PO TABS: 10.0000 mg | ORAL_TABLET | Freq: Every day | ORAL | 0 refills | Status: DC

## 2023-06-26 MED ORDER — APIXABAN 5 MG PO TABS
5.0000 mg | ORAL_TABLET | Freq: Two times a day (BID) | ORAL | 0 refills | Status: DC
  Filled 2023-06-26: qty 60, 30d supply, fill #0

## 2023-06-26 MED ORDER — PREDNISONE 20 MG PO TABS
40.0000 mg | ORAL_TABLET | Freq: Every day | ORAL | 0 refills | Status: AC
  Filled 2023-06-26: qty 14, 7d supply, fill #0

## 2023-06-26 MED ORDER — BUDESONIDE-FORMOTEROL FUMARATE 160-4.5 MCG/ACT IN AERO
2.0000 | INHALATION_SPRAY | Freq: Two times a day (BID) | RESPIRATORY_TRACT | 0 refills | Status: DC
  Filled 2023-06-26: qty 10.2, 30d supply, fill #0

## 2023-06-26 MED ORDER — ALBUTEROL SULFATE HFA 108 (90 BASE) MCG/ACT IN AERS
2.0000 | INHALATION_SPRAY | RESPIRATORY_TRACT | 0 refills | Status: DC | PRN
  Filled 2023-06-26: qty 6.7, 16d supply, fill #0

## 2023-06-26 NOTE — Progress Notes (Signed)
 This encounter was created in error - please disregard.

## 2023-06-26 NOTE — TOC Progression Note (Signed)
 Transition of Care [facility] - Progression Note    Patient Details  Name: Arthur Ali MRN: [id number] Date of Birth: 1960/07/02  Transition of Care [facility]) CM/SW Contact  Levie Ream, RN Phone Number: 06/26/2023, 1:03 PM  Clinical Narrative:    [facility] consult for MATCH; unable to obtain interpreter on line; Rosslyn Coons, RN acted as interpreter for pt; pt says he does not have insurance; explained terms of MATCH; pt verbalized understanding; his medications will be sent to [facility]; [place] RN says meds to bed will be utilized; letter given to pt; pharmacy notified via secure chat; no TOC needs.  MATCH MEDICATION ASSISTANCE CARD Pharmacies please call [phone] for claim processing assistance.  Rx BIN: [id number] Rx Group: [id number] Rx PCN: [id number] Relationship Code: 1 Person Code: [id number]  Patient ID (MRN): [id number]    Patient Name: Gerson Mybonyimani   Patient DOB: 1960/07/25   Discharge Date: 06/26/23  Expiration Date: 07/03/23 (must be filled within 7 days of discharge)   Dear Eliverto Gula Mybonyimani, You have been approved to have the prescriptions written by your discharging physician filled through our [facility] (Medication Assistance Through [facility]) program. This program allows for a one-time (no refills) 34-day supply of selected medications for a low copay amount.  The copay is $3.00 per prescription. For instance, if you have one prescription, you will pay $3.00; for two prescriptions, you pay $6.00; for three prescriptions, you pay $9.00; and so on. Only certain pharmacies are participating in this program with [facility]. You will need to select one of the pharmacies from the attached lists and take your prescriptions, this letter, and your photo ID to one of the participating pharmacies.  We are excited that you are able to use the [facility] program to get your medications. These prescriptions must be filled within 7 days of hospital discharge or they will no longer  be valid for the [facility] program. Should you have any problems with your prescriptions please contact your case management team member at [phone] for [place] or [phone] for [facility].  Thank you, [place] Expected Discharge Plan: Home/Self Care Barriers to Discharge: Barriers Resolved  Expected Discharge Plan and Services In-house Referral: NA Discharge Planning Services: NA Post Acute Care Choice: NA Living arrangements for the past 2 months: Apartment Expected Discharge Date: 06/26/23               DME Arranged: N/A DME Agency: NA       HH Arranged: NA HH Agency: NA         Social Determinants of Health (SDOH) Interventions SDOH Screenings   Food Insecurity: Food Insecurity Present (06/25/2023)  Housing: High Risk (06/25/2023)  Transportation Needs: Unmet Transportation Needs (06/25/2023)  Utilities: Not At Risk (06/25/2023)  Depression (PHQ2-9): Low Risk  (04/13/2023)  Financial Resource Strain: High Risk (05/05/2023)  Social Connections: Unknown (06/26/2023)  Stress: Stress Concern Present (05/04/2023)  Tobacco Use: Medium Risk (06/24/2023)  Health Literacy: Inadequate Health Literacy (02/09/2023)    Readmission Risk Interventions    06/26/2023   12:13 PM  Readmission Risk Prevention Plan  Transportation Screening Complete  PCP or Specialist Appt within 5-7 Days Complete  Home Care Screening Complete  Medication Review (RN CM) Complete

## 2023-06-26 NOTE — Plan of Care (Signed)

## 2023-06-26 NOTE — Plan of Care (Signed)
  Problem: Clinical Measurements: Goal: Will remain free from infection Outcome: Progressing Goal: Respiratory complications will improve Outcome: Progressing   Problem: Elimination: Goal: Will not experience complications related to bowel motility Outcome: Progressing Goal: Will not experience complications related to urinary retention Outcome: Progressing   Problem: Pain Managment: Goal: General experience of comfort will improve and/or be controlled Outcome: Progressing   Problem: Safety: Goal: Ability to remain free from injury will improve Outcome: Progressing

## 2023-06-26 NOTE — Discharge Summary (Signed)
 Physician Discharge Summary  Arthur Ali MWU:[id number] DOB: 01-Jan-1961 DOA: 06/24/2023  PCP: Azell Boll, MD  Admit date: 06/24/2023 Discharge date: 06/26/2023  Admitted From: Home Disposition: Home  Recommendations for Outpatient Follow-up:  Follow up with PCP in 1 week with repeat CBC/BMP Outpatient evaluation and follow-up by pulmonary Follow up in ED if symptoms worsen or new appear   Home Health: No Equipment/Devices: None  Discharge Condition: Stable CODE STATUS: Full Diet recommendation: Heart healthy  Brief/Interim Summary:  63 y.o. male with medical history significant for pulmonary embolism on Eliquis  (ran out of DOAC for 1 week), pulmonary hypertension seen on CT angio and echocardiogram, chronic HFpEF with LVEF 60-65%, grade 1 diastolic dysfunction, severe protein calorie malnutrition due to metastatic prostate cancer, cancer related pain, failure to thrive and generalized body pains, metastatic prostate cancer to the bone on Xtandi , hypertension presented with shortness of breath and right-sided chest pain and was referred to the ED from cancer center.  On presentation, he was hypertensive, tachycardic and tachypneic.  CT angio chest PE showed redemonstration of decrease in size of right lower lobe central string-like segmental pulmonary embolism with bronchitic changes with no associated bronchial pneumonia.  High-sensitivity troponins x 2 were negative.  He required supplemental oxygen.  He was started on IV Solu-Medrol  along with nebs.  During the hospitalization, his condition has improved.  He is currently on room air.  He feels better.  He will be discharged home today on oral prednisone  along with inhaled steroids and as needed albuterol .  Outpatient follow-up with PCP and pulmonary.  Discharge Diagnoses:   Acute respiratory failure with hypoxia Possible acute COPD exacerbation Bacterial pneumonia has been ruled out - Presented with worsening shortness of  breath and imaging was as above.  Procalcitonin less than 0.1.  Doubt that he has bacterial pneumonia.  -Treated with IV Solu-Medrol  along with nebs. -During the hospitalization, his condition has improved.  He is currently on room air.  He feels better.  He will be discharged home today on oral prednisone  along with inhaled steroids and as needed albuterol .  Outpatient follow-up with PCP and pulmonary.     History of pulmonary embolism - Ran out of Eliquis  a week ago.  Imaging as above.  Continue Eliquis .  Patient is to be compliant with his Eliquis .   Atypical chest pain History of pulmonary hypertension -High sensitive troponins x 2 were negative.  EKG was nonischemic.  Echo showed EF of 50 to 55%.   Metastatic prostate cancer - Continue Xtandi .  Outpatient follow-up with Dr. Kale/oncology   Severe protein calorie malnutrition - Nutrition consult   Chronic diastolic heart failure Hypertension - Currently compensated.  Continue diet and fluid restriction.  Echo showed EF of 50 -55 %.  Outpatient follow-up with cardiology.  Continue amlodipine    Physical deconditioning/generalized weakness -PT/OT recommended no PT/OT follow-up Discharge Instructions  Discharge Instructions     Diet - low sodium heart healthy   Complete by: As directed    Increase activity slowly   Complete by: As directed    Pulmonary Visit   Complete by: As directed    COPD   Reason for referral: Other Pulmonary      Allergies as of 06/26/2023   No Known Allergies      Medication List     TAKE these medications    acetaminophen  500 MG tablet Commonly known as: TYLENOL  Take 2 tablets (1,000 mg total) by mouth every 8 (eight) hours as needed.  albuterol  108 (90 Base) MCG/ACT inhaler Commonly known as: VENTOLIN  HFA Inhale 2 puffs into the lungs every 4 (four) hours as needed for wheezing or shortness of breath.   amLODipine  10 MG tablet Commonly known as: NORVASC  Take 1 tablet (10 mg total)  by mouth at bedtime.   apixaban  5 MG Tabs tablet Commonly known as: ELIQUIS  Take 1 tablet (5 mg total) by mouth 2 (two) times daily.   budesonide -formoterol  160-4.5 MCG/ACT inhaler Commonly known as: Symbicort  Inhale 2 puffs into the lungs 2 (two) times daily.   enzalutamide  80 MG tablet Commonly known as: Xtandi  Take 2 tablets (160 mg total) by mouth daily.   guaiFENesin -dextromethorphan  100-10 MG/5ML syrup Commonly known as: ROBITUSSIN DM Take 10 mLs by mouth every 4 (four) hours as needed for cough.   oxyCODONE  5 MG immediate release tablet Commonly known as: Oxy IR/ROXICODONE  Take 1 tablet (5 mg total) by mouth every 8 (eight) hours as needed for severe pain (pain score 7-10).   polyethylene glycol powder 17 GM/SCOOP powder Commonly known as: GLYCOLAX /MIRALAX  Take 17 g by mouth 2 (two) times daily as needed.   predniSONE  20 MG tablet Commonly known as: DELTASONE  Take 2 tablets (40 mg total) by mouth daily with breakfast for 7 days. Start taking on: June 27, 2023   senna 8.6 MG Tabs tablet Commonly known as: SENOKOT Take 1 tablet (8.6 mg total) by mouth daily.   Vitamin D  (Ergocalciferol ) 1.25 MG (50000 UNIT) Caps capsule Commonly known as: DRISDOL  Take 1 capsule (50,000 Units total) by mouth every 7 (seven) days.        Follow-up Information     Azell Boll, MD. Schedule an appointment as soon as possible for a visit in 1 week(s).   Specialty: Family Medicine Contact information: [address] [place] [address] [phone]                No Known Allergies  Consultations: None   Procedures/Studies: ECHOCARDIOGRAM COMPLETE Result Date: 06/25/2023    ECHOCARDIOGRAM REPORT   Patient Name:   Arthur Ali Date of Exam: 06/25/2023 Medical Rec #:  [id number]           Height:       69.0 in Accession #:    [id number]          Weight:       125.7 lb Date of Birth:  [birth year]            BSA:          1.696 m Patient Age:    63 years             BP:           124/97 mmHg Patient Gender: M                   HR:           80 bpm. Exam Location:  Inpatient Procedure: 2D Echo, Cardiac Doppler and Color Doppler (Both Spectral and Color            Flow Doppler were utilized during procedure). Indications:    Chest Pain R07.9  History:        Patient has prior history of Echocardiogram examinations, most                 recent 11/21/2022. Risk Factors:Hypertension.  Sonographer:    Terrilee Few RCS Referring Phys: [id number] Charlotta Cook HALL  Sonographer Comments: Image acquisition challenging due  to patient body habitus. IMPRESSIONS  1. Left ventricular ejection fraction, by estimation, is 50 to 55%. The left ventricle has low normal function.  2. Right ventricular systolic function is normal. The right ventricular size is normal.  3. The mitral valve is normal in structure. Trivial mitral valve regurgitation.  4. The aortic valve is tricuspid. Aortic valve regurgitation is not visualized. Aortic valve sclerosis/calcification is present, without any evidence of aortic stenosis. FINDINGS  Left Ventricle: Left ventricular ejection fraction, by estimation, is 50 to 55%. The left ventricle has low normal function. The left ventricular internal cavity size was normal in size. There is no left ventricular hypertrophy. Right Ventricle: The right ventricular size is normal. Right vetricular wall thickness was not assessed. Right ventricular systolic function is normal. Left Atrium: Left atrial size was normal in size. Right Atrium: Right atrial size was normal in size. Pericardium: There is no evidence of pericardial effusion. Mitral Valve: The mitral valve is normal in structure. Trivial mitral valve regurgitation. Tricuspid Valve: The tricuspid valve is normal in structure. Tricuspid valve regurgitation is not demonstrated. Aortic Valve: The aortic valve is tricuspid. Aortic valve regurgitation is not visualized. Aortic valve sclerosis/calcification is present,  without any evidence of aortic stenosis. Aortic valve peak gradient measures 2.9 mmHg. Pulmonic Valve: The pulmonic valve was not well visualized. Pulmonic valve regurgitation is not visualized. Aorta: The aortic root and ascending aorta are structurally normal, with no evidence of dilitation. IAS/Shunts: No atrial level shunt detected by color flow Doppler.  LEFT VENTRICLE PLAX 2D LVIDd:         3.40 cm   Diastology LVIDs:         2.30 cm   LV e' medial:    5.98 cm/s LV PW:         0.90 cm   LV E/e' medial:  8.9 LV IVS:        0.70 cm   LV e' lateral:   8.05 cm/s LVOT diam:     2.20 cm   LV E/e' lateral: 6.6 LV SV:         41 LV SV Index:   24 LVOT Area:     3.80 cm  RIGHT VENTRICLE            IVC RV S prime:     7.72 cm/s  IVC diam: 2.20 cm TAPSE (M-mode): 1.1 cm LEFT ATRIUM           Index        RIGHT ATRIUM           Index LA diam:      2.40 cm 1.42 cm/m   RA Area:     10.08 cm LA Vol (A2C): 35.6 ml 20.99 ml/m  RA Volume:   26.00 ml  15.33 ml/m LA Vol (A4C): 18.2 ml 10.76 ml/m  AORTIC VALVE AV Area (Vmax): 2.67 cm AV Vmax:        85.30 cm/s AV Peak Grad:   2.9 mmHg LVOT Vmax:      60.00 cm/s LVOT Vmean:     37.700 cm/s LVOT VTI:       0.108 m  AORTA Ao Root diam: 3.50 cm Ao Asc diam:  3.50 cm MITRAL VALVE MV Area (PHT): 3.75 cm    SHUNTS MV Decel Time: 203 msec    Systemic VTI:  0.11 m MV E velocity: 53.15 cm/s  Systemic Diam: 2.20 cm MV A velocity: 62.60 cm/s MV E/A ratio:  0.85 Ola Berger  MD Electronically signed by Ola Berger MD Signature Date/Time: 06/25/2023/3:25:51 PM    Final    CT Angio Chest PE W and/or Wo Contrast Result Date: 06/24/2023 CLINICAL DATA:  Chest wall pain, nontraumatic, malignancy known or suspected, xray done EXAM: CT ANGIOGRAPHY CHEST WITH CONTRAST TECHNIQUE: Multidetector CT imaging of the chest was performed using the standard protocol during bolus administration of intravenous contrast. Multiplanar CT image reconstructions and MIPs were obtained to evaluate the vascular  anatomy. RADIATION DOSE REDUCTION: This exam was performed according to the departmental dose-optimization program which includes automated exposure control, adjustment of the mA and/or kV according to patient size and/or use of iterative reconstruction technique. CONTRAST:  75mL OMNIPAQUE  IOHEXOL  350 MG/ML SOLN COMPARISON:  None Available. FINDINGS: Cardiovascular: Satisfactory opacification of the pulmonary arteries to the segmental level. The main pulmonary artery is mildly enlarged in caliber measuring up to 3.3 cm. Redemonstration of decreased in size right lower lobe central string-like segmental pulmonary embolus (10:268). No definite acute pulmonary embolus identified. Normal heart size. No significant pericardial effusion. The thoracic aorta is normal in caliber. Mild atherosclerotic plaque of the thoracic aorta. No coronary artery calcifications. Mediastinum/Nodes: Interval resolution of previously identified mediastinal lymphadenopathy. Limited evaluation of the mediastinal lymph nodes due to timing of contrast. No enlarged mediastinal, hilar, or axillary lymph nodes. Thyroid  gland, trachea, and esophagus demonstrate no significant findings. Lungs/Pleura: Diffuse bronchial wall thickening. Severe emphysematous changes. No focal consolidation. Stable paravertebral right upper lobe 5 mm pulmonary nodule (2:44). No pulmonary mass. No pleural effusion. No pneumothorax. Upper Abdomen: No acute abnormality. Musculoskeletal: No chest wall abnormality. Redemonstration of diffuse sclerotic axial and appendicular skeleton metastatic disease. No acute displaced fracture. Review of the MIP images confirms the above findings. IMPRESSION: 1. Redemonstration of decreased in size right lower lobe central string-like segmental pulmonary embolus. No definite acute pulmonary embolus identified. 2. Enlarged main pulmonary artery suggestive of pulmonary hypertension. 3. Stable paravertebral right upper lobe 5 mm pulmonary  nodule. 4. Interval resolution of previously identified mediastinal lymphadenopathy. Limited evaluation of the mediastinal lymph nodes due to timing of contrast. 5. Bronchitic changes with no associated bronchopneumonia. 6. Diffuse sclerotic axial and appendicular skeleton metastatic disease. 7. Aortic Atherosclerosis (ICD10-I70.0) and Emphysema (ICD10-J43.9). Electronically Signed   By: Morgane  Naveau M.D.   On: 06/24/2023 21:19   DG Chest 2 View Result Date: 06/24/2023 CLINICAL DATA:  Chest pain. EXAM: CHEST - 2 VIEW COMPARISON:  Chest x-ray 04/01/2023. FINDINGS: The lungs are hyperinflated, unchanged. There is stable scarring in the right costophrenic angle. There is no pleural effusion or pneumothorax. No focal lung infiltrate identified. The cardiomediastinal silhouette is within normal limits. No acute fractures are seen. IMPRESSION: 1. No active cardiopulmonary disease. 2. COPD. Electronically Signed   By: Tyron Gallon M.D.   On: 06/24/2023 17:29      Subjective: Patient seen and examined at bedside.  No fever, vomiting, chest pain reported.  Feels over-the-counter.  Discharge Exam: Vitals:   06/26/23 0529 06/26/23 0909  BP: 127/76   Pulse: 79   Resp: 14   Temp: 98.2 F (36.8 C)   SpO2: 95% 94%    General: Pt is alert, awake, not in acute distress.  On room air.  Slow to respond.  Poor historian. Cardiovascular: rate controlled, S1/S2 + Respiratory: bilateral decreased breath sounds at bases with very minimal wheezing Abdominal: Soft, NT, ND, bowel sounds + Extremities: no edema, no cyanosis    The results of significant diagnostics from this hospitalization (including imaging, microbiology,  ancillary and laboratory) are listed below for reference.     Microbiology: No results found for this or any previous visit (from the past 240 hours).   Labs: BNP (last 3 results) Recent Labs    12/14/22 1628 06/25/23 0031  BNP 69.5 106.8*   Basic Metabolic Panel: Recent Labs   Lab 06/24/23 1311 06/24/23 1512 06/25/23 0031  NA 139 136 138  K 4.0 3.6 3.7  CL 103 102 103  CO2 33* 28 25  GLUCOSE 82 84 94  BUN 13 13 12   CREATININE 0.73 0.78 0.68  CALCIUM 9.8 9.0 8.5*  MG  --   --  1.9  PHOS  --   --  4.1   Liver Function Tests: Recent Labs  Lab 06/24/23 1311  AST 18  ALT 8  ALKPHOS 63  BILITOT 0.4  PROT 8.6*  ALBUMIN 3.8   No results for input(s): "LIPASE", "AMYLASE" in the last 168 hours. No results for input(s): "AMMONIA" in the last 168 hours. CBC: Recent Labs  Lab 06/24/23 1311 06/24/23 1512 06/25/23 0031  WBC 6.4 6.6 7.1  NEUTROABS 2.3  --   --   HGB 13.5 13.4 13.0  HCT 41.0 41.5 41.4  MCV 85.2 88.1 89.8  PLT 192 185 160   Cardiac Enzymes: No results for input(s): "CKTOTAL", "CKMB", "CKMBINDEX", "TROPONINI" in the last 168 hours. BNP: Invalid input(s): "POCBNP" CBG: No results for input(s): "GLUCAP" in the last 168 hours. D-Dimer No results for input(s): "DDIMER" in the last 72 hours. Hgb A1c No results for input(s): "HGBA1C" in the last 72 hours. Lipid Profile No results for input(s): "CHOL", "HDL", "LDLCALC", "TRIG", "CHOLHDL", "LDLDIRECT" in the last 72 hours. Thyroid  function studies No results for input(s): "TSH", "T4TOTAL", "T3FREE", "THYROIDAB" in the last 72 hours.  Invalid input(s): "FREET3" Anemia work up No results for input(s): "VITAMINB12", "FOLATE", "FERRITIN", "TIBC", "IRON", "RETICCTPCT" in the last 72 hours. Urinalysis    Component Value Date/Time   BILIRUBINUR negative 12/05/2019 1610   KETONESUR negative 12/05/2019 1610   PROTEINUR negative 12/05/2019 1610   UROBILINOGEN 0.2 12/05/2019 1610   NITRITE Negative 12/05/2019 1610   LEUKOCYTESUR Negative 12/05/2019 1610   Sepsis Labs Recent Labs  Lab 06/24/23 1311 06/24/23 1512 06/25/23 0031  WBC 6.4 6.6 7.1   Microbiology No results found for this or any previous visit (from the past 240 hours).   Time coordinating discharge: 35  minutes  SIGNED:   Audria Leather, MD  [facility] 06/26/2023, 10:22 AM

## 2023-06-26 NOTE — Progress Notes (Signed)
 TOC discharge meds in a secure bag delivered to pt's nurse Tempie Fee). Eliquis  was $10.

## 2023-06-26 NOTE — TOC Initial Note (Addendum)
 Transition of Care [facility]) - Initial/Assessment Note    Patient Details  Name: Arthur Ali MRN: [id number] Date of Birth: 02-26-1961  Transition of [facility] CM/SW Contact:    Arthur Junior, LCSW Phone Number: 06/26/2023, 12:15 PM  Clinical Narrative:                 Pt from home alone. NO HHPT/OT required. Pt medically ready to d/c. Pt will require Taxi voucher to return to apartment. Taxi waiver signed. Pt is uninsured and get his medication from [employer]. Pt reports no income and unable for afford medication. TOC will apply MATCH. Resources added to AVS.  Expected Discharge Plan: Home/Self Care Barriers to Discharge: Barriers Resolved   Patient Goals and CMS Choice Patient states their goals for this hospitalization and ongoing recovery are:: return home CMS Medicare.gov Compare Post Acute Care list provided to::  (NA) Choice offered to / list presented to : NA  ownership interest in [facility].provided to::  (NA)    Expected Discharge Plan and Services In-house Referral: NA Discharge Planning Services: NA Post Acute Care Choice: NA Living arrangements for the past 2 months: Apartment Expected Discharge Date: 06/26/23               DME Arranged: N/A DME Agency: NA       HH Arranged: NA HH Agency: NA        Prior Living Arrangements/Services Living arrangements for the past 2 months: Apartment Lives with:: Self Patient language and need for interpreter reviewed:: Yes Do you feel safe going back to the place where you live?: Yes      Need for Family Participation in Patient Care: No (Comment) Care giver support system in place?: Yes (comment)   Criminal Activity/Legal Involvement Pertinent to Current Situation/Hospitalization: No - Comment as needed  Activities of Daily Living   ADL Screening (condition at time of admission) Independently performs ADLs?: Yes (appropriate for developmental age) Is the patient deaf or have  difficulty hearing?: No Does the patient have difficulty seeing, even when wearing glasses/contacts?: No Does the patient have difficulty concentrating, remembering, or making decisions?: No  Permission Sought/Granted                  Emotional Assessment Appearance:: Appears stated age     Orientation: : Oriented to Self, Oriented to Place, Oriented to  Time, Oriented to Situation Alcohol / Substance Use: Not Applicable Psych Involvement: No (comment)  Admission diagnosis:  Shortness of breath [R06.02] Acute hypoxic respiratory failure (HCC) [J96.01] Patient Active Problem List   Diagnosis Date Noted   Acute hypoxic respiratory failure (HCC) 06/25/2023   Shortness of breath 04/13/2023   Chest pain 04/01/2023   URI (upper respiratory infection) 04/01/2023   Right elbow pain 03/23/2023   Essential hypertension 01/19/2023   Housing insecurity 12/28/2022   Ankle swelling 12/15/2022   Food insecurity 12/15/2022   Prostate cancer metastatic to bone (HCC) 11/20/2022   Cancer related pain 11/20/2022   Bilateral low back pain with left-sided sciatica 02/14/2020   Encounter for health examination of refugee 12/05/2019   Screening-pulmonary TB 12/05/2019   PCP:  Arthur Boll, MD Pharmacy:   [facility] [phone] - [place], [place] - [address] AT [facility] [address] [place] [address] Phone: [phone] Fax: [phone]  [facility] [address] [place] [address] Phone: [phone] Fax: [phone]  [place] [facility] [address] [place] [address] Phone: [phone] Fax: [phone]  [place] [facility] [address] [place] [address] Phone: [phone] Fax: [phone]  [facility] - [place], [place] - [address] [place] [address] Phone:  [phone] Fax: [phone]  [facility] - [place], [place] - [address] [place] [address] Phone: [phone] Fax: [phone]     Social Drivers of Health (SDOH) Social History: SDOH Screenings   Food Insecurity: Food Insecurity Present (06/25/2023)  Housing: High Risk (06/25/2023)  Transportation Needs: Unmet Transportation Needs (06/25/2023)  Utilities: Not At Risk (06/25/2023)  Depression (PHQ2-9): Low Risk  (04/13/2023)  Financial Resource Strain: High Risk (05/05/2023)  Social Connections: Unknown (06/26/2023)  Stress: Stress Concern Present (05/04/2023)  Tobacco Use: Medium Risk (06/24/2023)  Health Literacy: Inadequate Health Literacy (02/09/2023)   SDOH Interventions:     Readmission Risk Interventions    06/26/2023   12:13 PM  Readmission Risk Prevention Plan  Transportation Screening Complete  PCP or Specialist Appt within 5-7 Days Complete  Home Care Screening Complete  Medication Review (RN CM) Complete

## 2023-06-26 NOTE — Congregational Nurse Program (Addendum)
  Dept: [phone]   Congregational Nurse Program Note  Date of Encounter: 06/26/2023  Past Medical History: Past Medical History:  Diagnosis Date   Back pain    Hypertension    Prostate cancer [facility])     Encounter Details:  Community Questionnaire - 06/26/23 1314       Questionnaire   Ask client: Do you give verbal consent for me to treat you today? Yes    Student Assistance N/A    Location Patient Served  [facility]    Encounter Setting Phone/Text/Email;Hospital    Population Status Migrant/Refugee    Insurance Uninsured ([employer]/Care Connects/Self-Pay/Medicaid Family Planning)    Insurance/Financial Assistance Referral Charitable Care    Medication Have Medication Insecurities;Patient Medications Reviewed;Provided Medication Assistance;Referred to Medication Assistance    Medical Provider Yes    Screening Referrals Made N/A    Medical Referrals Made N/A    Medical Appointment Completed [facility] PCP/Clinic    CNP Interventions Advocate/Support;Navigate Healthcare System;Counsel;Case Management;Educate    Screenings CN Performed N/A    ED Visit Averted N/A    Life-Saving Intervention Made N/A            Patient unable to afford medication at discharge. Uninsured patient who lost employer health coverage. He was unable to return to work due to illness. Congregational Nurse assisted with SSI/Medicaid/SNAP applications about a month ago and patient is waiting for approval. I have assisted case manager with language interpretation to discuss medication assistance through the [facility] program. Unable to fill Eliquis  through [facility] Patient was provided with 4 tablets of Eliquis  at a cost of $60 to last him over the weekend. Staff message sent to Dr Bevin Bucks and Dr Concepcion Deck regarding need for continuation anticoagulation medication.  I will continue to assist as outpatient as needed.  Perla Bradford Deloros Beretta RN BSN PCCN  [facility] [phone]

## 2023-06-26 NOTE — Progress Notes (Signed)
 Mobility Specialist - Progress Note   06/26/23 1027  Mobility  Activity Transferred from bed to chair  Level of Assistance Contact guard assist, steadying assist  Assistive Device Front wheel walker  Activity Response Tolerated well  Mobility Referral Yes  Mobility visit 1 Mobility  Mobility Specialist Start Time (ACUTE ONLY) 1011  Mobility Specialist Stop Time (ACUTE ONLY) 1026  Mobility Specialist Time Calculation (min) (ACUTE ONLY) 15 min   Pt received in bed and agreeable to transfer to the recliner. Pt was CG from STS. No complaints during session. Pt to recliner after session with all needs met.    [facility]

## 2023-06-28 ENCOUNTER — Encounter: Payer: Self-pay | Admitting: Hematology

## 2023-06-28 ENCOUNTER — Telehealth: Payer: Self-pay | Admitting: Pharmacist

## 2023-06-28 NOTE — Telephone Encounter (Signed)
 At the request of Dr. Bevin Bucks,  Medication Samples have been provided for patient use.  Drug name: Eliquis  (apixaban )       Strength: 5mg         Qty: 56  LOT: [id number]  Exp.Date: 04/08/2024  Dosing instructions: 1 tablet BID  The patient has been instructed regarding the correct time, dose, and frequency of taking this medication, including desired effects and most common side effects.   Cristal Don 1:01 PM 06/28/2023  Derrick Fling to assist with patient access / pick-up.

## 2023-06-28 NOTE — Telephone Encounter (Signed)
-----   Message from Nurse Yevonne Heman sent at 06/28/2023 10:47 AM EDT ----- Regarding: RE: Eliquis  Thank you, I will pick up from front desk today. Dorothy. ----- Message ----- From: Abbey Abbe, RPH-CPP Sent: 06/28/2023  10:11 AM EDT To: Lori Rondo Muhoro, RN; Fredirick Jasmine, CPhT; # Subject: RE: Eliquis                                     Dorothy,   I have placed a month supply of Eliquis  for this patient to pick-up at the front desk.  Thanks in advance for your assistance.   I anticipate Medicaid will help support continued access to this medication in the future. ----- Message ----- From: Azell Boll, MD Sent: 06/27/2023   3:06 PM EDT To: Lori Rondo Muhoro, RN; Abbey Abbe, RPH-CPP; # Subject: RE: Eliquis                                     Hi Perla Bradford Yes! We should have Eliquis  samples available for pick up on Monday---do you need me to bring them to [facility] on Monday PM?   [facility] am at the [facility] Monday Am but Dr. Lindy Pennisi can likely help at [facility] too. . I am including Dr. Drue Gerald so we can get this to him  ASAP  Thanks so much for reaching out  CV ----- Message ----- From: Muhoro, Lori Rondo, RN Sent: 06/26/2023   4:21 PM EDT To: Abbey Abbe, RPH-CPP; Azell Boll, MD Subject: Eliquis                                         Hello Dr Bevin Bucks, Mr Helminiak was admitted on Thursday and discharged Saturday from [facility].He was unable to afford Eliquis  being uninsured but he was given 4 tablets to last him over the weekend. We applied for Medicaid about a month ago but haven't heard anything back yet. How can we help him?! Dorothy.

## 2023-06-28 NOTE — Telephone Encounter (Signed)
 Reviewed and agree with Dr Macky Lower plan.

## 2023-06-30 NOTE — Congregational Nurse Program (Addendum)
  Dept: [phone]   Congregational Nurse Program Note  Date of Encounter: 06/30/2023  Past Medical History: Past Medical History:  Diagnosis Date   Back pain    Hypertension    Prostate cancer [facility])     Encounter Details:  Community Questionnaire - 06/30/23 1120       Questionnaire   Ask client: Do you give verbal consent for me to treat you today? Yes    Student Assistance N/A    Location Patient Served  [facility]    Encounter Setting CN site    Population Status Migrant/Refugee    Insurance Uninsured ([employer] Card/Care Connects/Self-Pay/Medicaid Family Planning)    Insurance/Financial Assistance Referral Charitable Care    Medication Have Medication Insecurities;Patient Medications Reviewed;Provided Medication Assistance;Referred to Medication Assistance    Medical Provider Yes    Screening Referrals Made N/A    Medical Referrals Made N/A    Medical Appointment Completed [facility]    CNP Interventions Advocate/Support;Navigate Healthcare System;Counsel;Case Management;Educate    Screenings CN Performed N/A    ED Visit Averted N/A    Life-Saving Intervention Made N/A            06/28/23 Medication Eliquis  picked up from [facility] front desk. Patient called and instructed to pick up this medication from during ESL classes. Requested patient to bring all his medication for inspection.  06/29/23 All home medications inspected. Noted that patient has unopened bottle of Eliquis . Patient informed  to starting taking Eliquis  as instructed.  Patient has 2 months supply of Eliquis  including what I picked up from [facility]. Patient verbalized instructions with teach back.  I will continue to assist as needed.  Perla Bradford Rashaan Wyles RN BSN PCCN  [facility] [phone]-cell [phone]-office

## 2023-07-05 ENCOUNTER — Telehealth: Payer: Self-pay

## 2023-07-05 NOTE — Telephone Encounter (Signed)
 error

## 2023-07-20 ENCOUNTER — Ambulatory Visit: Payer: Self-pay

## 2023-07-20 ENCOUNTER — Telehealth: Payer: Self-pay | Admitting: Family Medicine

## 2023-07-20 NOTE — Telephone Encounter (Signed)
 Called patient about visit. He is unable to come to visit today. He will bring all medications with him. Double time given to physician given complexity.  Perla Bradford- are you able to please arrange transport?  Thanks so much Otho Blitz, MD  [facility] Medicine Teaching Service

## 2023-07-21 ENCOUNTER — Other Ambulatory Visit (HOSPITAL_COMMUNITY): Payer: Self-pay

## 2023-07-21 ENCOUNTER — Other Ambulatory Visit: Payer: Self-pay | Admitting: Physician Assistant

## 2023-07-21 ENCOUNTER — Encounter: Payer: Self-pay | Admitting: Student

## 2023-07-21 ENCOUNTER — Encounter: Payer: Self-pay | Admitting: Hematology

## 2023-07-21 ENCOUNTER — Other Ambulatory Visit: Payer: Self-pay

## 2023-07-21 ENCOUNTER — Ambulatory Visit (INDEPENDENT_AMBULATORY_CARE_PROVIDER_SITE_OTHER): Payer: Self-pay | Admitting: Student

## 2023-07-21 ENCOUNTER — Telehealth: Payer: Self-pay

## 2023-07-21 VITALS — BP 134/91 | HR 100 | Temp 98.1°F | Ht 69.0 in | Wt 129.2 lb

## 2023-07-21 DIAGNOSIS — C7951 Secondary malignant neoplasm of bone: Secondary | ICD-10-CM | POA: Diagnosis not present

## 2023-07-21 DIAGNOSIS — C61 Malignant neoplasm of prostate: Secondary | ICD-10-CM | POA: Diagnosis not present

## 2023-07-21 DIAGNOSIS — G893 Neoplasm related pain (acute) (chronic): Secondary | ICD-10-CM

## 2023-07-21 DIAGNOSIS — I10 Essential (primary) hypertension: Secondary | ICD-10-CM

## 2023-07-21 DIAGNOSIS — R079 Chest pain, unspecified: Secondary | ICD-10-CM | POA: Diagnosis present

## 2023-07-21 MED ORDER — AMLODIPINE BESYLATE 10 MG PO TABS
10.0000 mg | ORAL_TABLET | Freq: Every day | ORAL | 1 refills | Status: DC
Start: 2023-07-21 — End: 2023-10-28
  Filled 2023-07-21: qty 90, 90d supply, fill #0
  Filled 2023-10-15 (×3): qty 90, 90d supply, fill #1

## 2023-07-21 MED ORDER — OXYCODONE HCL 5 MG PO TABS
5.0000 mg | ORAL_TABLET | Freq: Three times a day (TID) | ORAL | 0 refills | Status: DC | PRN
  Filled 2023-07-21: qty 60, 20d supply, fill #0

## 2023-07-21 MED ORDER — AMLODIPINE BESYLATE 10 MG PO TABS: 10.0000 mg | ORAL_TABLET | Freq: Every day | ORAL | 1 refills | Status: DC

## 2023-07-21 NOTE — Assessment & Plan Note (Addendum)
 BP: (!) 134/91 today. Goal of <140/90 however given life expectancy, monitor for now. Medication regimen: Amlodipine  10 mg daily, refill sent.

## 2023-07-21 NOTE — Congregational Nurse Program (Signed)
  Dept: [phone]   Congregational Nurse Program Note  Date of Encounter: 07/21/2023  Past Medical History: Past Medical History:  Diagnosis Date   Back pain    Hypertension    Prostate cancer [facility])     Encounter Details:  Community Questionnaire - 07/21/23 0959       Questionnaire   Ask client: Do you give verbal consent for me to treat you today? Yes    Student Assistance N/A    Location [facility] Served  [facility]    Encounter Setting CN site    Population Status Migrant/Refugee    Insurance Uninsured ([place] Card/Care Connects/Self-Pay/Medicaid Family Planning)    Insurance/Financial Assistance Referral Charitable Care    Medication Have Medication Insecurities;Patient Medications Reviewed;Provided Medication Assistance;Referred to Medication Assistance    Medical Provider Yes    Screening Referrals Made N/A    Medical Referrals Made [facility]    Medical Appointment Completed [facility]    CNP Interventions Advocate/Support;Navigate Healthcare System;Counsel;Case Management;Educate    Screenings CN Performed N/A    ED Visit Averted N/A    Life-Saving Intervention Made N/A            Patient came in to my office requesting transportation assistance. Inspected his medications. He is out of Amlodipine  Oxycodone  and Xtandi . I have called pharmacy to refill and informed that provider authorization is needed. Meds will be mailed to home once authorized. Transportation assistance provided.  Perla Bradford Karely Hurtado RN BSN PCCN  [facility] [phone]-cell [phone]-office

## 2023-07-21 NOTE — Progress Notes (Signed)
  SUBJECTIVE:   CHIEF COMPLAINT / HPI:   Hospital follow-up: Patient was admitted from 4/17-19 for acute respiratory failure with hypoxia treated with IV Solu-Medrol  and nebs.  He also presents with his medications to review them today.  Med rec follow up. He brings his medicine today. He takes Amlodipine  10mg  daily, eliquis  5mg  BID. He completed Xtandi  3 days ago.He has extra Eliquis  for when he runs out. Denies missing any dosages. He has an empty bottle of prednisone . He has an albuterol  inhaler and an empty Symbicort  inhaler.  Chest pain: With right arm pain, started 2 months ago. States it did not start today. It is not every day but comes and goes and when it comes on it is in his chest, right arm and hand. Denies pain elsewhere. He endorses shortness of breath. He believes the pain in his chest is causing his difficulty breathing and has been going on for 2 weeks. Endorses dyspnea with exertion. The albuterol  inhaler helps when he feels short of breath.   Kinyarwandan interpreter used throughout encounter.  Of note, he has follow up with Oncologist on 5/16.   PERTINENT  PMH / PSH: HTN, prostate cancer metastatic to bone, pulmonary hypertension, chronic HFpEF, PE on Eliquis   OBJECTIVE:  BP (!) 134/91   Pulse 100   Temp 98.1 F (36.7 C) (Oral)   Ht 5\' 9"  (1.753 m)   Wt 129 lb 4 oz (58.6 kg)   SpO2 98%   BMI 19.09 kg/m  General: Well-appearing, NAD CV: Regular rhythm, tracely tachycardic, no murmurs appreciable Pulm: CTAB, normal WOB  ASSESSMENT/PLAN:   Assessment & Plan Chest pain, unspecified type EKG unchanged, has received significant appropriate workup prior and continually takes Eliquis  appropriately.  No changes at this time. Essential hypertension BP: (!) 134/91 today. Goal of <140/90 however given life expectancy, monitor for now. Medication regimen: Amlodipine  10 mg daily, refill sent. Prostate cancer metastatic to bone [facility]) Will need refill of Xtandi , message  sent to congressional nurse Dorothy. Refill oxycodone .  Veronia Goon, DO 07/21/2023, 12:17 PM PGY-3, [facility]

## 2023-07-21 NOTE — Patient Instructions (Addendum)
 It was great to see you today! Thank you for choosing [facility] for your primary care.  Uyu munsi twaganiriye: 1. EKG yawe ntigihinduka kuva mbere rero ntukeke ko ububabare bwo mu gatuza budasanzwe kuri wewe.  Ufite gahunda na oncologue wawe le 5/16, nzamenyesha Doroti ko [place] imiti yawe.  Tuzakomeza gukurikirana umuvuduko wamaraso kandi ufite urundi ruzinduko natwe mukwezi 1.  Today we addressed: Your EKG is unchanged from previously therefore do not suspect the chest pain is abnormal for you.  You have an appointment with your oncologist on 5/16, I will let Perla Bradford know that you need a refill of your answer medication.  We will continue to monitor your blood pressure and you have another visit with us  in 1 month.  If you haven't already, sign up for My Chart to have easy access to your labs results, and communication with your primary care physician.  Please arrive 15 minutes before your appointment to ensure smooth check in process.  We appreciate your efforts in making this happen.  Thank you for allowing me to participate in your care, Veronia Goon, DO 07/21/2023, 11:30 AM PGY-3, [facility]

## 2023-07-21 NOTE — Telephone Encounter (Signed)
 I Called [facility] to order Xtandi  [phone] and was informed that medication was delivered to [facility] on 07/06/23. I will make arrangement for patient to [facility].  Perla Bradford Desarea Ohagan RN BSN PCCN  [facility] [phone]-cell [phone]-office

## 2023-07-21 NOTE — Assessment & Plan Note (Addendum)
 Will need refill of Xtandi , message sent to congressional nurse Dorothy. Refill oxycodone .

## 2023-07-21 NOTE — Assessment & Plan Note (Signed)
 EKG unchanged, has received significant appropriate workup prior and continually takes Eliquis  appropriately.  No changes at this time.

## 2023-07-21 NOTE — Progress Notes (Signed)
 HEMATOLOGY/ONCOLOGY CLINIC NOTE  Date of Service: 07/23/2023  Patient Care Team: Azell Boll, MD as PCP - General (Family Medicine) Katheleen Palmer, RN as Oncology Nurse Navigator  CHIEF COMPLAINTS/PURPOSE OF CONSULTATION:  Evaluation and management of newly metastatic prostate cancer  HISTORY OF PRESENTING ILLNESS:  Arthur Ali is a wonderful 63 y.o. male who is being seen for evaluation and management of likely metastatic prostate cancer based on elevated PSA levels.     Patient was previously seen by me in 2021 for lymphocytosis thought to be a reactive process. At that time, he had no signs of monoclonal lymphocytes. He was also vitamin B12 deficient at that time and his pseudothrombocytopenia had resolved.    Patient presented to the hospital with worsening dyspnea chest pain night sweats loss of appetite and unintentional weight loss and will directly admitted from the family medicine clinic.  CT of the chest done on 11/19/2022 showed no evidence of pulmonary embolism but did show pathologic thoracic adenopathy within the right paratracheal and subcarinal lymph node groups as well as widespread sclerotic metastatic disease throughout the visualized axial skeleton.  He was noted to have a 6 mm noncalcified pulmonary nodule in the right upper lobe.  Also noted to have mild central pulmonary artery enlargement with suggestions of elevated right heart pressure. He also had a x-ray of the lumbar spine which showed degenerative change of the lumbar spine and diffuse mottled appearance of the femoral heads and pelvis which could be concerning for metastatic disease.   Patient had a PSA test which showed significant elevation to nearly 1500 being highly suggestive of metastatic prostate cancer. TB QuantiFERON test is currently pending.   Patient CBC today shows normal WBC count of 10.2k with anemia with a hemoglobin of 10.5 and a platelet count of 174k. CMP shows hypocalcemia with  calcium of 7.9, decreased albumin of 1.8 and alkaline phosphatase of 500. Normal transaminases and bilirubin.  Creatinine is within normal limits at 0.76.   HIV test is nonreactive TSH was within normal limits at 1.85 Multiple myeloma panel and K/L FLC are currently pending.   Patient was seen in airborne isolation with the help of video [place] interpreter Alveria Johann).  He notes that he has lost 10 to 15 kg of body weight with significantly decreased p.o. intake.  Notes decreased urinary flow about a week ago but is a little better now.  No overt hematuria.  Notes some issues with constipation. Has had diffuse bodyaches especially over the spine and pelvis.   Also notes some shortness of breath. Chest wall pain noted. Notes no new upper or lower extremity weakness, no loss of bowel or bladder control.  INTERVAL HISTORY:  [name] is a wonderful 63 y.o. male who is here for continue evaluation and management of metastatic prostate cancer. Patient was last seen by me on 04/30/2023 and reported mild fluctuating pain in the right elbow, and 4-pound weight-loss.   Patient presented to the ED on 06/24/2023 for SOB related to COPD exacerbation.  An interpretor is present during today's visit, who provides translation.   Patient's weight has been stable in the last few months. His weight in clinic today is 129 pounds.   Patient was noted to be in the hospital last month for respiratory infection, which he reports is mildly improving.   Patient reports having breathing issues in the middle of the night when getting up to use the restroom. He has been given inhaler.   He reports  that he was a former smoker and quit 3 months ago.   Patient complains of pain throughout the right arm and fingers as well as mild chest discomfort. He denies engaging in any heavy lifting.   He denies any new back pain or new bone pains otherwise. Patient does have Oxycodone  available to take as needed for  pain.  Patient is taking Xtandi  regularly.   He denies any issues passing urine or moving bowels.  He is only eating once a day due to lack of appetite. Patient generally feels hungry around 3PM.   MEDICAL HISTORY:  Past Medical History:  Diagnosis Date   Back pain    Hypertension    Prostate cancer (HCC)     SURGICAL HISTORY: No past surgical history on file.  SOCIAL HISTORY: Social History   Socioeconomic History   Marital status: Married    Spouse name: Not on file   Number of children: Not on file   Years of education: Not on file   Highest education level: Not on file  Occupational History   Not on file  Tobacco Use   Smoking status: Former    Types: Cigars, Cigarettes   Smokeless tobacco: Never   Tobacco comments:    Quit in 2022.   Vaping Use   Vaping status: Never Used  Substance and Sexual Activity   Alcohol use: Never   Drug use: Never   Sexual activity: Not Currently  Other Topics Concern   Not on file  Social History Narrative   Single applicant from [place]   No family here or in [place]       Refugee Information   Number of Immediate Family Members: 0   Number of Immediate Family Members in [place] : 0   Country of Birth: [facility]   Country of Origin: [place]   Location of Refugee Camp: [place]   Duration in [place]: 20 years or greater   Reason for Leaving Home Country: Political opinion   Primary Language: Swahili/Kiswahili, Kinyarwanda/Rwanda   Able to Read in Primary Language: Yes   Able to Write in Primary Language: Yes   Education: Primary School   Prior Work: No previous jobs   Marital Status: Other (Wife and children decreased)   Sexual Activity: No   Tuberculosis Screening Overseas: Positive   Tuberculosis Screening Health Department: Not Completed   Health Department Labs Completed: Yes   History of Trauma: None   Do You Feel Jumpy or Nervous?: No   Are You Very Watchful or 'Super Alert'?: No   Social Drivers of Health   Financial Resource Strain:  High Risk (05/05/2023)   Overall Financial Resource Strain (CARDIA)    Difficulty of Paying Living Expenses: Very hard  Food Insecurity: Food Insecurity Present (06/25/2023)   Hunger Vital Sign    Worried About Running Out of Food in the Last Year: Often true    Ran Out of Food in the Last Year: Often true  Transportation Needs: Unmet Transportation Needs (06/25/2023)   PRAPARE - Administrator, Civil Service (Medical): Yes    Lack of Transportation (Non-Medical): Yes  Physical Activity: Not on file  Stress: Stress Concern Present (05/04/2023)   [employer] of Occupational Health - Occupational Stress Questionnaire    Feeling of Stress : Very much  Social Connections: Unknown (06/26/2023)   Social Connection and Isolation Panel [NHANES]    Frequency of Communication with Friends and Family: Patient unable to answer    Frequency of Social Gatherings with  Friends and Family: Patient unable to answer    Attends Religious Services: Patient unable to answer    Active Member of Clubs or Organizations: Patient unable to answer    Attends Club or Organization Meetings: Patient unable to answer    Marital Status: Married  Catering manager Violence: Not At Risk (06/25/2023)   Humiliation, Afraid, Rape, and Kick questionnaire    Fear of Current or Ex-Partner: No    Emotionally Abused: No    Physically Abused: No    Sexually Abused: No    FAMILY HISTORY: Family History  Family history unknown: Yes    ALLERGIES:  has no known allergies.  MEDICATIONS:  Current Outpatient Medications  Medication Sig Dispense Refill   acetaminophen  (TYLENOL ) 500 MG tablet Take 2 tablets (1,000 mg total) by mouth every 8 (eight) hours as needed. (Patient not taking: Reported on 06/25/2023) 100 tablet 0   albuterol  (VENTOLIN  HFA) 108 (90 Base) MCG/ACT inhaler Inhale 2 puffs into the lungs every 4 (four) hours as needed for wheezing or shortness of breath. 6.7 g 0   amLODipine  (NORVASC ) 10 MG tablet  Take 1 tablet (10 mg total) by mouth at bedtime. 90 tablet 1   apixaban  (ELIQUIS ) 5 MG TABS tablet Take 1 tablet (5 mg total) by mouth 2 (two) times daily. 60 tablet 0   budesonide -formoterol  (SYMBICORT ) 160-4.5 MCG/ACT inhaler Inhale 2 puffs into the lungs 2 (two) times daily. 10.2 g 0   enzalutamide  (XTANDI ) 80 MG tablet Take 2 tablets (160 mg total) by mouth daily. 60 tablet 11   guaiFENesin -dextromethorphan  (ROBITUSSIN DM) 100-10 MG/5ML syrup Take 10 mLs by mouth every 4 (four) hours as needed for cough. 236 mL 0   oxyCODONE  (OXY IR/ROXICODONE ) 5 MG immediate release tablet Take 1 tablet (5 mg total) by mouth every 8 (eight) hours as needed for severe pain (pain score 7-10). 60 tablet 0   polyethylene glycol powder (GLYCOLAX /MIRALAX ) 17 GM/SCOOP powder Take 17 g by mouth 2 (two) times daily as needed. (Patient not taking: Reported on 06/25/2023) 3350 g 1   senna (SENOKOT) 8.6 MG TABS tablet Take 1 tablet (8.6 mg total) by mouth daily. (Patient not taking: Reported on 06/25/2023) 30 tablet 3   Vitamin D , Ergocalciferol , (DRISDOL ) 1.25 MG (50000 UNIT) CAPS capsule Take 1 capsule (50,000 Units total) by mouth every 7 (seven) days. (Patient not taking: Reported on 06/25/2023) 12 capsule 3   No current facility-administered medications for this visit.    REVIEW OF SYSTEMS:    10 Point review of Systems was done is negative except as noted above.   PHYSICAL EXAMINATION: ECOG PERFORMANCE STATUS: 2 - Symptomatic, <50% confined to bed  . Vitals:   07/23/23 1105  BP: (!) 131/92  Pulse: 94  Resp: 18  Temp: (!) 97.3 F (36.3 C)  SpO2: 96%   Filed Weights   07/23/23 1105  Weight: 129 lb 4.8 oz (58.7 kg)   .Body mass index is 19.09 kg/m.  GENERAL:alert, in no acute distress and comfortable SKIN: no acute rashes, no significant lesions EYES: conjunctiva are pink and non-injected, sclera anicteric OROPHARYNX: MMM, no exudates, no oropharyngeal erythema or ulceration NECK: supple, no  JVD LYMPH:  no palpable lymphadenopathy in the cervical, axillary or inguinal regions LUNGS: clear to auscultation b/l with normal respiratory effort HEART: regular rate & rhythm ABDOMEN:  normoactive bowel sounds , non tender, not distended. Extremity: no pedal edema PSYCH: alert & oriented x 3 with fluent speech NEURO: no focal motor/sensory deficits  LABORATORY DATA:  I have reviewed the data as listed  .    Latest Ref Rng & Units 07/23/2023   10:26 AM 06/25/2023   12:31 AM 06/24/2023    3:12 PM  CBC  WBC 4.0 - 10.5 K/uL 6.9  7.1  6.6   Hemoglobin 13.0 - 17.0 g/dL 82.9  56.2  13.0   Hematocrit 39.0 - 52.0 % 43.0  41.4  41.5   Platelets 150 - 400 K/uL 173  160  185     .    Latest Ref Rng & Units 07/23/2023   10:26 AM 06/25/2023   12:31 AM 06/24/2023    3:12 PM  CMP  Glucose 70 - 99 mg/dL 87  94  84   BUN 8 - 23 mg/dL 19  12  13    Creatinine 0.61 - 1.24 mg/dL 8.65  7.84  6.96   Sodium 135 - 145 mmol/L 139  138  136   Potassium 3.5 - 5.1 mmol/L 4.8  3.7  3.6   Chloride 98 - 111 mmol/L 105  103  102   CO2 22 - 32 mmol/L 31  25  28    Calcium 8.9 - 10.3 mg/dL 9.3  8.5  9.0   Total Protein 6.5 - 8.1 g/dL 8.9     Total Bilirubin 0.0 - 1.2 mg/dL 0.4     Alkaline Phos 38 - 126 U/L 55     AST 15 - 41 U/L 22     ALT 0 - 44 U/L 14      Component     Latest Ref Rng 11/20/2022 11/21/2022  Prostatic Specific Antigen     0.00 - 4.00 ng/mL 1,444.22 (H)    Vitamin D , 25-Hydroxy     30 - 100 ng/mL  33.22     Legend: (H) High  SURGICAL PATHOLOGY  CASE: [id number]  PATIENT: Margrett Sherry  Surgical Pathology Report      Clinical History: sclerotic changes/infiltration, anticipated prostate  mets (cm)      FINAL MICROSCOPIC DIAGNOSIS:   A. BONE, RIGHT POSTERIOR ILIAC, BIOPSY:  -  Metastatic carcinoma, consistent with prostate origin.   Note: The biopsy of bone with extensive bone marrow fibrosis/sclerosis.  Embedded in the sclerotic stroma are extremely  crushed cells without  identifiable morphology; however, the clinical impression of metastatic  prostate carcinoma is noted and confirmatory immunohistochemical stains  highlight these crushed cells of interest as positive for NKX3.1  consistent with metastatic carcinoma of prostate primary.  Dr. Almeda Jacobs is  peer-reviewed the case and agrees with the interpretation.    RADIOGRAPHIC STUDIES: I have personally reviewed the radiological images as listed and agreed with the findings in the report. ECHOCARDIOGRAM COMPLETE Result Date: 06/25/2023    ECHOCARDIOGRAM REPORT   Patient Name:   ARJEN DERINGER Date of Exam: 06/25/2023 Medical Rec #:  [id number]           Height:       69.0 in Accession #:    [id number]          Weight:       125.7 lb Date of Birth:  Aug 15, 1960            BSA:          1.696 m Patient Age:    63 years            BP:           124/97 mmHg Patient Gender: M  HR:           80 bpm. Exam Location:  Inpatient Procedure: 2D Echo, Cardiac Doppler and Color Doppler (Both Spectral and Color            Flow Doppler were utilized during procedure). Indications:    Chest Pain R07.9  History:        Patient has prior history of Echocardiogram examinations, most                 recent 11/21/2022. Risk Factors:Hypertension.  Sonographer:    Terrilee Few RCS Referring Phys: [id number] Charlotta Cook HALL  Sonographer Comments: Image acquisition challenging due to patient body habitus. IMPRESSIONS  1. Left ventricular ejection fraction, by estimation, is 50 to 55%. The left ventricle has low normal function.  2. Right ventricular systolic function is normal. The right ventricular size is normal.  3. The mitral valve is normal in structure. Trivial mitral valve regurgitation.  4. The aortic valve is tricuspid. Aortic valve regurgitation is not visualized. Aortic valve sclerosis/calcification is present, without any evidence of aortic stenosis. FINDINGS  Left Ventricle: Left ventricular  ejection fraction, by estimation, is 50 to 55%. The left ventricle has low normal function. The left ventricular internal cavity size was normal in size. There is no left ventricular hypertrophy. Right Ventricle: The right ventricular size is normal. Right vetricular wall thickness was not assessed. Right ventricular systolic function is normal. Left Atrium: Left atrial size was normal in size. Right Atrium: Right atrial size was normal in size. Pericardium: There is no evidence of pericardial effusion. Mitral Valve: The mitral valve is normal in structure. Trivial mitral valve regurgitation. Tricuspid Valve: The tricuspid valve is normal in structure. Tricuspid valve regurgitation is not demonstrated. Aortic Valve: The aortic valve is tricuspid. Aortic valve regurgitation is not visualized. Aortic valve sclerosis/calcification is present, without any evidence of aortic stenosis. Aortic valve peak gradient measures 2.9 mmHg. Pulmonic Valve: The pulmonic valve was not well visualized. Pulmonic valve regurgitation is not visualized. Aorta: The aortic root and ascending aorta are structurally normal, with no evidence of dilitation. IAS/Shunts: No atrial level shunt detected by color flow Doppler.  LEFT VENTRICLE PLAX 2D LVIDd:         3.40 cm   Diastology LVIDs:         2.30 cm   LV e' medial:    5.98 cm/s LV PW:         0.90 cm   LV E/e' medial:  8.9 LV IVS:        0.70 cm   LV e' lateral:   8.05 cm/s LVOT diam:     2.20 cm   LV E/e' lateral: 6.6 LV SV:         41 LV SV Index:   24 LVOT Area:     3.80 cm  RIGHT VENTRICLE            IVC RV S prime:     7.72 cm/s  IVC diam: 2.20 cm TAPSE (M-mode): 1.1 cm LEFT ATRIUM           Index        RIGHT ATRIUM           Index LA diam:      2.40 cm 1.42 cm/m   RA Area:     10.08 cm LA Vol (A2C): 35.6 ml 20.99 ml/m  RA Volume:   26.00 ml  15.33 ml/m LA Vol (A4C): 18.2 ml 10.76 ml/m  AORTIC VALVE AV Area (Vmax): 2.67 cm AV Vmax:        85.30 cm/s AV Peak Grad:   2.9 mmHg  LVOT Vmax:      60.00 cm/s LVOT Vmean:     37.700 cm/s LVOT VTI:       0.108 m  AORTA Ao Root diam: 3.50 cm Ao Asc diam:  3.50 cm MITRAL VALVE MV Area (PHT): 3.75 cm    SHUNTS MV Decel Time: 203 msec    Systemic VTI:  0.11 m MV E velocity: 53.15 cm/s  Systemic Diam: 2.20 cm MV A velocity: 62.60 cm/s MV E/A ratio:  0.85 Ola Berger MD Electronically signed by Ola Berger MD Signature Date/Time: 06/25/2023/3:25:51 PM    Final    CT Angio Chest PE W and/or Wo Contrast Result Date: 06/24/2023 CLINICAL DATA:  Chest wall pain, nontraumatic, malignancy known or suspected, xray done EXAM: CT ANGIOGRAPHY CHEST WITH CONTRAST TECHNIQUE: Multidetector CT imaging of the chest was performed using the standard protocol during bolus administration of intravenous contrast. Multiplanar CT image reconstructions and MIPs were obtained to evaluate the vascular anatomy. RADIATION DOSE REDUCTION: This exam was performed according to the departmental dose-optimization program which includes automated exposure control, adjustment of the mA and/or kV according to patient size and/or use of iterative reconstruction technique. CONTRAST:  75mL OMNIPAQUE  IOHEXOL  350 MG/ML SOLN COMPARISON:  None Available. FINDINGS: Cardiovascular: Satisfactory opacification of the pulmonary arteries to the segmental level. The main pulmonary artery is mildly enlarged in caliber measuring up to 3.3 cm. Redemonstration of decreased in size right lower lobe central string-like segmental pulmonary embolus (10:268). No definite acute pulmonary embolus identified. Normal heart size. No significant pericardial effusion. The thoracic aorta is normal in caliber. Mild atherosclerotic plaque of the thoracic aorta. No coronary artery calcifications. Mediastinum/Nodes: Interval resolution of previously identified mediastinal lymphadenopathy. Limited evaluation of the mediastinal lymph nodes due to timing of contrast. No enlarged mediastinal, hilar, or axillary lymph nodes.  Thyroid  gland, trachea, and esophagus demonstrate no significant findings. Lungs/Pleura: Diffuse bronchial wall thickening. Severe emphysematous changes. No focal consolidation. Stable paravertebral right upper lobe 5 mm pulmonary nodule (2:44). No pulmonary mass. No pleural effusion. No pneumothorax. Upper Abdomen: No acute abnormality. Musculoskeletal: No chest wall abnormality. Redemonstration of diffuse sclerotic axial and appendicular skeleton metastatic disease. No acute displaced fracture. Review of the MIP images confirms the above findings. IMPRESSION: 1. Redemonstration of decreased in size right lower lobe central string-like segmental pulmonary embolus. No definite acute pulmonary embolus identified. 2. Enlarged main pulmonary artery suggestive of pulmonary hypertension. 3. Stable paravertebral right upper lobe 5 mm pulmonary nodule. 4. Interval resolution of previously identified mediastinal lymphadenopathy. Limited evaluation of the mediastinal lymph nodes due to timing of contrast. 5. Bronchitic changes with no associated bronchopneumonia. 6. Diffuse sclerotic axial and appendicular skeleton metastatic disease. 7. Aortic Atherosclerosis (ICD10-I70.0) and Emphysema (ICD10-J43.9). Electronically Signed   By: Morgane  Naveau M.D.   On: 06/24/2023 21:19   DG Chest 2 View Result Date: 06/24/2023 CLINICAL DATA:  Chest pain. EXAM: CHEST - 2 VIEW COMPARISON:  Chest x-ray 04/01/2023. FINDINGS: The lungs are hyperinflated, unchanged. There is stable scarring in the right costophrenic angle. There is no pleural effusion or pneumothorax. No focal lung infiltrate identified. The cardiomediastinal silhouette is within normal limits. No acute fractures are seen. IMPRESSION: 1. No active cardiopulmonary disease. 2. COPD. Electronically Signed   By: Tyron Gallon M.D.   On: 06/24/2023 17:29    ASSESSMENT & PLAN:   63  y.o. male from [place] who primarily speaks Kinyarwanda (very little [place]) presenting with  shortness of breath, failure to thrive and generalized body pains   1,Newly diagnosed Metastatic prostate cancer with extensive skeletal lesions and thoracic adenopathy. Stage 5.  PSA level nearly 1500.   2.  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   NO evidence of monoclonal paraproteinemia to suggestive primary bone marrow disorder.   3.  Mild lymphocytosis lymphocyte count of 5.2k previously noted to be nonclonal.  Likely from chronic inflammation/reactive.  Previously has been as high as 9.2k   4.  Findings of pulmonary hypertension on CTA--echocardiogram  with normal EF 60-65%, grade 1 diastolic dysfunction   5.  Pulmonary embolism -- possible-- on ELiquis    6.  Severe protein calorie malnutrition with weight loss of 30 to 40 pounds due to his metastatic prostate cancer.  7. Cancer related pain  PLAN:  -Discussed lab results on 07/23/2023 in detail with patient. CBC normal, showed WBC of 6.9K, hemoglobin of 14.5, and platelets of 173K. -CMP normal -PSA level continues to improve. PSA level improved from peak of 234 in October and recently normalized at 2.2 on 06/24/2023 -his prostate cancer appears to be stable at this time -PSA level testing from today is pending at this time -discussed that though his condition is not curable, out goal is to hopefully keep his condition controlled for a long time -patient notes no new or major toxicity issues with Xtandi  -continue Xtandi  160mg  po daily  -continue Zometa  monthly -continue Eligard  every 3 months  -continue Eliquis  5 MG as prescribed -advised patient to eat as much as he can to optimize nutritional status -discussed options of taking protein supplements/drinks to help to optimize nutrition -discussed option of receiving food from our food pantry.  -proud of patient for smoking cessation 3 months ago -discussed that his breathing difficulty was thought to be caused by his acute infection and  smoking history -discussed that his smoking cessation 3 months ago should improve his breathing habits -discussed that he is most likely having muscular/skeletal pain in the right arm -recommend resting the right arm to allow muscles and tendons in the area heal if they were stretched  -Patient noted to have all of his medications, including blood thinners, at this time.  -advised patient to let us  know if there are any medications on his list he does not have available -patient shall return to clinic in 3 months  FOLLOW-UP: Continue monthly zometa  Continue every 3 monthly Eligard  RTC with Dr Salomon Cree with labs in 3 months  The total time spent in the appointment was 30 minutes* .  All of the patient's questions were answered with apparent satisfaction. The patient knows to call the clinic with any problems, questions or concerns.   Jacquelyn Matt MD MS AAHIVMS [facility] [place] Hematology/Oncology Physician [facility]  .*Total Encounter Time as defined by the Centers for Medicare and Medicaid Services includes, in addition to the face-to-face time of a patient visit (documented in the note above) non-face-to-face time: obtaining and reviewing outside history, ordering and reviewing medications, tests or procedures, care coordination (communications with other health care professionals or caregivers) and documentation in the medical record.    I,Mitra Faeizi,acting as a Neurosurgeon for Jacquelyn Matt, MD.,have documented all relevant documentation on the behalf of Jacquelyn Matt, MD,as directed by  Jacquelyn Matt, MD while in the presence of Jacquelyn Matt, MD.  .I have reviewed the above documentation for accuracy and completeness, and  I agree with the above. .Adhya Cocco Kishore Maddock Finigan MD

## 2023-07-22 ENCOUNTER — Other Ambulatory Visit: Payer: Self-pay

## 2023-07-22 DIAGNOSIS — C61 Malignant neoplasm of prostate: Secondary | ICD-10-CM

## 2023-07-23 ENCOUNTER — Inpatient Hospital Stay: Payer: BC Managed Care – PPO | Attending: Hematology | Admitting: Hematology

## 2023-07-23 ENCOUNTER — Inpatient Hospital Stay: Payer: BC Managed Care – PPO

## 2023-07-23 ENCOUNTER — Telehealth: Payer: Self-pay

## 2023-07-23 VITALS — BP 131/92 | HR 94 | Temp 97.3°F | Resp 18 | Wt 129.3 lb

## 2023-07-23 DIAGNOSIS — C7951 Secondary malignant neoplasm of bone: Secondary | ICD-10-CM

## 2023-07-23 DIAGNOSIS — I272 Pulmonary hypertension, unspecified: Secondary | ICD-10-CM | POA: Insufficient documentation

## 2023-07-23 DIAGNOSIS — D72820 Lymphocytosis (symptomatic): Secondary | ICD-10-CM | POA: Diagnosis not present

## 2023-07-23 DIAGNOSIS — R911 Solitary pulmonary nodule: Secondary | ICD-10-CM | POA: Insufficient documentation

## 2023-07-23 DIAGNOSIS — C61 Malignant neoplasm of prostate: Secondary | ICD-10-CM | POA: Diagnosis present

## 2023-07-23 DIAGNOSIS — Z87891 Personal history of nicotine dependence: Secondary | ICD-10-CM | POA: Diagnosis not present

## 2023-07-23 DIAGNOSIS — D649 Anemia, unspecified: Secondary | ICD-10-CM | POA: Insufficient documentation

## 2023-07-23 DIAGNOSIS — Z7901 Long term (current) use of anticoagulants: Secondary | ICD-10-CM | POA: Insufficient documentation

## 2023-07-23 DIAGNOSIS — G893 Neoplasm related pain (acute) (chronic): Secondary | ICD-10-CM | POA: Insufficient documentation

## 2023-07-23 LAB — CMP (CANCER CENTER ONLY)
ALT: 14 U/L (ref 0–44)
AST: 22 U/L (ref 15–41)
Albumin: 4 g/dL (ref 3.5–5.0)
Alkaline Phosphatase: 55 U/L (ref 38–126)
Anion gap: 3 — ABNORMAL LOW (ref 5–15)
BUN: 19 mg/dL (ref 8–23)
CO2: 31 mmol/L (ref 22–32)
Calcium: 9.3 mg/dL (ref 8.9–10.3)
Chloride: 105 mmol/L (ref 98–111)
Creatinine: 0.87 mg/dL (ref 0.61–1.24)
GFR, Estimated: >60 mL/min (ref >60)
Glucose, Bld: 87 mg/dL (ref 70–99)
Potassium: 4.8 mmol/L (ref 3.5–5.1)
Sodium: 139 mmol/L (ref 135–145)
Total Bilirubin: 0.4 mg/dL (ref 0.0–1.2)
Total Protein: 8.9 g/dL — ABNORMAL HIGH (ref 6.5–8.1)

## 2023-07-23 LAB — CBC WITH DIFFERENTIAL (CANCER CENTER ONLY)
Abs Immature Granulocytes: 0.01 10*3/uL (ref 0.00–0.07)
Basophils Absolute: 0 10*3/uL (ref 0.0–0.1)
Basophils Relative: 0 %
Eosinophils Absolute: 0.3 10*3/uL (ref 0.0–0.5)
Eosinophils Relative: 4 %
HCT: 43 % (ref 39.0–52.0)
Hemoglobin: 14.5 g/dL (ref 13.0–17.0)
Immature Granulocytes: 0 %
Lymphocytes Relative: 56 %
Lymphs Abs: 3.9 10*3/uL (ref 0.7–4.0)
MCH: 28.4 pg (ref 26.0–34.0)
MCHC: 33.7 g/dL (ref 30.0–36.0)
MCV: 84.3 fL (ref 80.0–100.0)
Monocytes Absolute: 0.3 10*3/uL (ref 0.1–1.0)
Monocytes Relative: 5 %
Neutro Abs: 2.4 10*3/uL (ref 1.7–7.7)
Neutrophils Relative %: 35 %
Platelet Count: 173 10*3/uL (ref 150–400)
RBC: 5.1 MIL/uL (ref 4.22–5.81)
RDW: 13.2 % (ref 11.5–15.5)
WBC Count: 6.9 10*3/uL (ref 4.0–10.5)
nRBC: 0 % (ref 0.0–0.2)

## 2023-07-23 MED ORDER — LEUPROLIDE ACETATE (3 MONTH) 22.5 MG ~~LOC~~ KIT: 22.5000 mg | PACK | Freq: Once | SUBCUTANEOUS | Status: DC

## 2023-07-23 MED ORDER — ZOLEDRONIC ACID 4 MG/100ML IV SOLN
4.0000 mg | Freq: Once | INTRAVENOUS | Status: AC
  Administered 2023-07-23: 4 mg via INTRAVENOUS
  Filled 2023-07-23: qty 100

## 2023-07-23 NOTE — Progress Notes (Signed)
 Pt was given his Xtandi  bottle of tablets today as they were delivered here. Pt understands how to take. Reviewed other meds with his translator.

## 2023-07-23 NOTE — Telephone Encounter (Signed)
 Patient called with appointment reminder, transportation assistance will be provided.  Perla Bradford Tyna Huertas RN BSN PCCN  [facility] [phone]-office

## 2023-07-23 NOTE — Patient Instructions (Signed)

## 2023-07-24 LAB — PSA, TOTAL AND FREE
PSA, Free Pct: 49.4 %
PSA, Free: 0.79 ng/mL
Prostate Specific Ag, Serum: 1.6 ng/mL (ref 0.0–4.0)

## 2023-07-29 ENCOUNTER — Encounter: Payer: Self-pay | Admitting: Hematology

## 2023-08-03 NOTE — Congregational Nurse Program (Signed)
  Dept: [phone]   Congregational Nurse Program Note  Date of Encounter: 08/03/2023  Past Medical History: Past Medical History:  Diagnosis Date   Back pain    Hypertension    Prostate cancer [facility])     Encounter Details:  Community Questionnaire - 08/03/23 1148       Questionnaire   Ask client: Do you give verbal consent for me to treat you today? Yes    Student Assistance N/A    Location Patient Served  [facility]    Encounter Setting CN site    Population Status Migrant/Refugee    Insurance Uninsured ([employer] Card/Care Connects/Self-Pay/Medicaid Family Planning)    Insurance/Financial Assistance Referral Charitable Care    Medication Have Medication Insecurities;Patient Medications Reviewed;Provided Medication Assistance;Referred to Medication Assistance    Medical Provider Yes    Screening Referrals Made N/A    Medical Referrals Made [facility]    Medical Appointment Completed [facility]    CNP Interventions Advocate/Support;Navigate Healthcare System;Counsel;Case Management;Educate    Screenings CN Performed N/A    ED Visit Averted N/A    Life-Saving Intervention Made N/A            Patient accompanied by Case manager from [facility] presented to [facility] office and it was confirmed that patient has food stamps. Patient should have received a card by now. Patient stated that he has not received the card yet.  Case manager will assist in applying for a new card.  Perla Bradford Chisum Habenicht RN BSN PCCN  [facility] [phone]-cell [phone]-office

## 2023-08-13 ENCOUNTER — Encounter: Payer: Self-pay | Admitting: Hematology

## 2023-08-19 ENCOUNTER — Other Ambulatory Visit: Payer: Self-pay

## 2023-08-19 DIAGNOSIS — C61 Malignant neoplasm of prostate: Secondary | ICD-10-CM

## 2023-08-20 ENCOUNTER — Inpatient Hospital Stay: Payer: Self-pay
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
## 2023-08-22 ENCOUNTER — Telehealth: Payer: Self-pay

## 2023-08-22 NOTE — Telephone Encounter (Signed)
 I have called patient with appointment reminder. Transportation assistance will be provided.  Nicole Cella Yunuen Mordan RN BSN PCCN  [facility] [phone]-cell [phone]-office

## 2023-08-23 ENCOUNTER — Encounter: Payer: Self-pay | Admitting: *Deleted

## 2023-08-23 ENCOUNTER — Inpatient Hospital Stay

## 2023-08-23 ENCOUNTER — Inpatient Hospital Stay: Attending: Hematology

## 2023-08-23 ENCOUNTER — Other Ambulatory Visit: Payer: Self-pay | Admitting: Hematology and Oncology

## 2023-08-23 VITALS — BP 143/95 | HR 88 | Temp 98.4°F | Resp 18 | Wt 126.4 lb

## 2023-08-23 DIAGNOSIS — C7951 Secondary malignant neoplasm of bone: Secondary | ICD-10-CM

## 2023-08-23 DIAGNOSIS — C61 Malignant neoplasm of prostate: Secondary | ICD-10-CM | POA: Insufficient documentation

## 2023-08-23 DIAGNOSIS — Z5111 Encounter for antineoplastic chemotherapy: Secondary | ICD-10-CM | POA: Diagnosis present

## 2023-08-23 LAB — CBC WITH DIFFERENTIAL (CANCER CENTER ONLY)
Abs Immature Granulocytes: 0.03 10*3/uL (ref 0.00–0.07)
Basophils Absolute: 0 10*3/uL (ref 0.0–0.1)
Basophils Relative: 1 %
Eosinophils Absolute: 0.3 10*3/uL (ref 0.0–0.5)
Eosinophils Relative: 5 %
HCT: 44.1 % (ref 39.0–52.0)
Hemoglobin: 14.7 g/dL (ref 13.0–17.0)
Immature Granulocytes: 0 %
Lymphocytes Relative: 55 %
Lymphs Abs: 3.8 10*3/uL (ref 0.7–4.0)
MCH: 27.9 pg (ref 26.0–34.0)
MCHC: 33.3 g/dL (ref 30.0–36.0)
MCV: 83.7 fL (ref 80.0–100.0)
Monocytes Absolute: 0.3 10*3/uL (ref 0.1–1.0)
Monocytes Relative: 4 %
Neutro Abs: 2.4 10*3/uL (ref 1.7–7.7)
Neutrophils Relative %: 35 %
Platelet Count: 185 10*3/uL (ref 150–400)
RBC: 5.27 MIL/uL (ref 4.22–5.81)
RDW: 12.5 % (ref 11.5–15.5)
WBC Count: 6.9 10*3/uL (ref 4.0–10.5)
nRBC: 0 % (ref 0.0–0.2)

## 2023-08-23 LAB — CMP (CANCER CENTER ONLY)
ALT: 11 U/L (ref 0–44)
AST: 19 U/L (ref 15–41)
Albumin: 3.9 g/dL (ref 3.5–5.0)
Alkaline Phosphatase: 60 U/L (ref 38–126)
Anion gap: 6 (ref 5–15)
BUN: 13 mg/dL (ref 8–23)
CO2: 29 mmol/L (ref 22–32)
Calcium: 9.2 mg/dL (ref 8.9–10.3)
Chloride: 107 mmol/L (ref 98–111)
Creatinine: 0.87 mg/dL (ref 0.61–1.24)
GFR, Estimated: >60 mL/min (ref >60)
Glucose, Bld: 114 mg/dL — ABNORMAL HIGH (ref 70–99)
Potassium: 3.8 mmol/L (ref 3.5–5.1)
Sodium: 142 mmol/L (ref 135–145)
Total Bilirubin: 0.3 mg/dL (ref 0.0–1.2)
Total Protein: 8.6 g/dL — ABNORMAL HIGH (ref 6.5–8.1)

## 2023-08-23 MED ORDER — SODIUM CHLORIDE 0.9 % IV SOLN: Freq: Once | INTRAVENOUS | Status: AC

## 2023-08-23 MED ORDER — ZOLEDRONIC ACID 4 MG/100ML IV SOLN
4.0000 mg | Freq: Once | INTRAVENOUS | Status: AC
  Administered 2023-08-23: 4 mg via INTRAVENOUS
  Filled 2023-08-23: qty 100

## 2023-08-23 MED ORDER — LEUPROLIDE ACETATE (3 MONTH) 22.5 MG ~~LOC~~ KIT
22.5000 mg | PACK | Freq: Once | SUBCUTANEOUS | Status: AC
  Administered 2023-08-23: 22.5 mg via SUBCUTANEOUS
  Filled 2023-08-23: qty 22.5

## 2023-08-23 NOTE — Patient Instructions (Signed)

## 2023-08-24 ENCOUNTER — Other Ambulatory Visit (HOSPITAL_COMMUNITY): Payer: Self-pay

## 2023-08-24 ENCOUNTER — Ambulatory Visit (INDEPENDENT_AMBULATORY_CARE_PROVIDER_SITE_OTHER): Payer: Self-pay | Admitting: Family Medicine

## 2023-08-24 VITALS — BP 110/68 | HR 99 | Ht 69.0 in | Wt 127.8 lb

## 2023-08-24 DIAGNOSIS — H6121 Impacted cerumen, right ear: Secondary | ICD-10-CM | POA: Diagnosis not present

## 2023-08-24 DIAGNOSIS — J42 Unspecified chronic bronchitis: Secondary | ICD-10-CM | POA: Diagnosis present

## 2023-08-24 DIAGNOSIS — H7292 Unspecified perforation of tympanic membrane, left ear: Secondary | ICD-10-CM

## 2023-08-24 DIAGNOSIS — I2699 Other pulmonary embolism without acute cor pulmonale: Secondary | ICD-10-CM | POA: Diagnosis not present

## 2023-08-24 DIAGNOSIS — I10 Essential (primary) hypertension: Secondary | ICD-10-CM | POA: Diagnosis not present

## 2023-08-24 DIAGNOSIS — Z23 Encounter for immunization: Secondary | ICD-10-CM | POA: Diagnosis not present

## 2023-08-24 MED ORDER — BUDESONIDE-FORMOTEROL FUMARATE 160-4.5 MCG/ACT IN AERO
2.0000 | INHALATION_SPRAY | Freq: Two times a day (BID) | RESPIRATORY_TRACT | 2 refills | Status: DC
  Filled 2023-08-24: qty 10.2, 30d supply, fill #0
  Filled 2023-09-07 – 2023-09-22 (×2): qty 10.2, 30d supply, fill #1
  Filled 2023-10-28: qty 10.2, 30d supply, fill #2
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
## 2023-08-24 NOTE — Patient Instructions (Addendum)
 It was wonderful to see you today.  Next appointment: Tuesday July 29th at 2:00 pm  Please bring ALL of your medications with you to every visit.   Today we talked about:  - You can use the Symbicort  2 puffs twice daily- the red inhaler to prevent getting short of breath- I sent refills to your pharmacy  - For your ears- the left one is perforated, please don't put anything in it. It will heal.  - The right one has ear wax, we washed it out some today.   - We did your pneumonia vaccine today  Thank you for choosing [facility].   Please call [phone] with any questions about today's appointment.  Please arrive at least 15 minutes prior to your scheduled appointments.   If you had blood work today, I will send you a MyChart message or a letter if results are normal. Otherwise, I will give you a call.   If you had a referral placed, they will call you to set up an appointment. Please give us  a call if you don't hear back in the next 2 weeks.   If you need additional refills before your next appointment, please call your pharmacy first.   Avanell Bob, MD  Family Medicine

## 2023-08-24 NOTE — Assessment & Plan Note (Addendum)
 At goal, continue home amlodipine  10 mg daily

## 2023-08-24 NOTE — Congregational Nurse Program (Signed)
  Dept: [phone]   Congregational Nurse Program Note  Date of Encounter: 08/24/2023  Past Medical History: Past Medical History:  Diagnosis Date   Back pain    Hypertension    Prostate cancer [facility])     Encounter Details:  Community Questionnaire - 08/24/23 1053       Questionnaire   Ask client: Do you give verbal consent for me to treat you today? Yes    Student Assistance N/A    Location Patient Served  [facility]    Encounter Setting CN site    Population Status Migrant/Refugee    Insurance Uninsured ([employer] Card/Care Connects/Self-Pay/Medicaid Family Planning)    Insurance/Financial Assistance Referral Charitable Care    Medication Have Medication Insecurities;Patient Medications Reviewed;Provided Medication Assistance;Referred to Medication Assistance    Medical Provider Yes    Screening Referrals Made N/A    Medical Referrals Made [facility]    Medical Appointment Completed [facility]    CNP Interventions Advocate/Support;Navigate Healthcare System;Counsel;Case Management;Educate    Screenings CN Performed N/A    ED Visit Averted N/A    Life-Saving Intervention Made N/A          Patient brought bank statements. He has $1800 in the retirement account and $455 in his checking account. Unable to appeal for SSI denial decision at this time because he is above the required amount of $2000.Advised to come back after paying rent and utilities.  Perla Bradford Shelia Magallon RN BSN PCCN  [facility] [phone]-cell [phone]-office

## 2023-08-25 LAB — PSA, TOTAL AND FREE

## 2023-09-07 ENCOUNTER — Other Ambulatory Visit: Payer: Self-pay

## 2023-09-07 ENCOUNTER — Encounter: Payer: Self-pay | Admitting: Hematology

## 2023-09-07 ENCOUNTER — Other Ambulatory Visit (HOSPITAL_COMMUNITY): Payer: Self-pay

## 2023-09-07 ENCOUNTER — Telehealth: Payer: Self-pay | Admitting: Family Medicine

## 2023-09-07 MED ORDER — APIXABAN 5 MG PO TABS: 5.0000 mg | ORAL_TABLET | Freq: Two times a day (BID) | ORAL | 3 refills | Status: DC

## 2023-09-07 NOTE — Congregational Nurse Program (Signed)
  Dept: [phone]   Congregational Nurse Program Note  Date of Encounter: 09/07/2023  Past Medical History: Past Medical History:  Diagnosis Date   Back pain    Hypertension    Prostate cancer [facility])     Encounter Details:  Community Questionnaire - 09/07/23 1224       Questionnaire   Ask client: Do you give verbal consent for me to treat you today? Yes    Chiropractor    Location Patient Served  [facility]    Encounter Setting CN site    Population Status Migrant/Refugee    Insurance Uninsured ([employer] Card/Care Connects/Self-Pay/Medicaid Family Planning)    Insurance/Financial Assistance Referral Charitable Care    Medication Have Medication Insecurities;Patient Medications Reviewed;Provided Medication Assistance;Referred to Medication Assistance    Medical Provider Yes    Screening Referrals Made N/A    Medical Referrals Made [facility]    Medical Appointment Completed N/A    CNP Interventions Advocate/Support;Navigate Healthcare System;Counsel;Case Management;Educate    Screenings CN Performed N/A    ED Visit Averted N/A    Life-Saving Intervention Made N/A        Patient has been taken to court due to court arrears. He will be taken to court tomorrow at 1:30 pm. Patient is up to date with his rent. He has receipts to prove it. Case manager has written a letter and is attached to receipts to take to the leasing office. An intern will accompany him to court tomorrow.  I have called [facility] because he has run out of Eliquis . Prescription will need doctor's authorization. I have reached out to Dr. Delores.  He has also ran out of Xtandi  medication. This medication is ready for pickup from the [facility]. I will provide patient with transportation.   Naomie Caryle Helgeson RN BSN PCCN  [facility] [phone]-cell [phone]-office

## 2023-09-07 NOTE — Telephone Encounter (Signed)
 Refilled Eliquis .  Suzann Daring, MD  Family Medicine Teaching Service

## 2023-09-14 NOTE — Congregational Nurse Program (Signed)
  Dept: [phone]   Congregational Nurse Program Note  Date of Encounter: 09/14/2023  Past Medical History: Past Medical History:  Diagnosis Date   Back pain    Hypertension    Prostate cancer [facility])     Encounter Details:  Community Questionnaire - 09/14/23 1145       Questionnaire   Ask client: Do you give verbal consent for me to treat you today? Yes    Chiropractor    Location Patient Served  [facility]    Encounter Setting CN site    Population Status Migrant/Refugee    Insurance Uninsured ([employer] Card/Care Connects/Self-Pay/Medicaid Family Planning)    Insurance/Financial Assistance Referral Charitable Care    Medication Have Medication Insecurities;Patient Medications Reviewed;Provided Medication Assistance;Referred to Medication Assistance    Medical Provider Yes    Screening Referrals Made N/A    Medical Referrals Made [facility]    Medical Appointment Completed N/A    CNP Interventions Advocate/Support;Navigate Healthcare System;Counsel;Case Management;Educate    Screenings CN Performed N/A    ED Visit Averted N/A    Life-Saving Intervention Made N/A         Patient came in to request assistance to go to the bank to cash out checks to pay his rent. Case manager intern will assist him to go to the bank.  Naomie Coretta Leisey RN BSN PCCN  [facility] [phone]-cell [phone]-office

## 2023-09-16 ENCOUNTER — Other Ambulatory Visit: Payer: Self-pay

## 2023-09-16 DIAGNOSIS — C61 Malignant neoplasm of prostate: Secondary | ICD-10-CM

## 2023-09-17 ENCOUNTER — Inpatient Hospital Stay: Payer: Self-pay | Attending: Hematology

## 2023-09-17 ENCOUNTER — Telehealth: Payer: Self-pay

## 2023-09-17 ENCOUNTER — Inpatient Hospital Stay: Payer: Self-pay

## 2023-09-17 VITALS — BP 142/88 | HR 88 | Temp 98.2°F | Resp 18

## 2023-09-17 DIAGNOSIS — C61 Malignant neoplasm of prostate: Secondary | ICD-10-CM | POA: Insufficient documentation

## 2023-09-17 DIAGNOSIS — C7951 Secondary malignant neoplasm of bone: Secondary | ICD-10-CM | POA: Insufficient documentation

## 2023-09-17 LAB — CMP (CANCER CENTER ONLY)
ALT: 11 U/L (ref 0–44)
AST: 20 U/L (ref 15–41)
Albumin: 3.7 g/dL (ref 3.5–5.0)
Alkaline Phosphatase: 50 U/L (ref 38–126)
Anion gap: 5 (ref 5–15)
BUN: 17 mg/dL (ref 8–23)
CO2: 32 mmol/L (ref 22–32)
Calcium: 9.4 mg/dL (ref 8.9–10.3)
Chloride: 103 mmol/L (ref 98–111)
Creatinine: 0.87 mg/dL (ref 0.61–1.24)
GFR, Estimated: >60 mL/min (ref >60)
Glucose, Bld: 92 mg/dL (ref 70–99)
Potassium: 3.4 mmol/L — ABNORMAL LOW (ref 3.5–5.1)
Sodium: 140 mmol/L (ref 135–145)
Total Bilirubin: 0.4 mg/dL (ref 0.0–1.2)
Total Protein: 8.4 g/dL — ABNORMAL HIGH (ref 6.5–8.1)

## 2023-09-17 LAB — CBC WITH DIFFERENTIAL (CANCER CENTER ONLY)
Abs Immature Granulocytes: 0.02 K/uL (ref 0.00–0.07)
Basophils Absolute: 0 K/uL (ref 0.0–0.1)
Basophils Relative: 0 %
Eosinophils Absolute: 0.2 K/uL (ref 0.0–0.5)
Eosinophils Relative: 3 %
HCT: 41.4 % (ref 39.0–52.0)
Hemoglobin: 14 g/dL (ref 13.0–17.0)
Immature Granulocytes: 0 %
Lymphocytes Relative: 59 %
Lymphs Abs: 4.1 K/uL — ABNORMAL HIGH (ref 0.7–4.0)
MCH: 28.3 pg (ref 26.0–34.0)
MCHC: 33.8 g/dL (ref 30.0–36.0)
MCV: 83.8 fL (ref 80.0–100.0)
Monocytes Absolute: 0.4 K/uL (ref 0.1–1.0)
Monocytes Relative: 5 %
Neutro Abs: 2.4 K/uL (ref 1.7–7.7)
Neutrophils Relative %: 33 %
Platelet Count: 177 K/uL (ref 150–400)
RBC: 4.94 MIL/uL (ref 4.22–5.81)
RDW: 12.8 % (ref 11.5–15.5)
WBC Count: 7.1 K/uL (ref 4.0–10.5)
nRBC: 0 % (ref 0.0–0.2)

## 2023-09-17 MED ORDER — ZOLEDRONIC ACID 4 MG/100ML IV SOLN
4.0000 mg | Freq: Once | INTRAVENOUS | Status: AC
  Administered 2023-09-17: 4 mg via INTRAVENOUS

## 2023-09-17 NOTE — Patient Instructions (Signed)

## 2023-09-17 NOTE — Telephone Encounter (Signed)
Patient called with appointment reminder  Carles Florea RN BSN PCCN  [facility] [phone]-cell [phone]-office  

## 2023-09-18 LAB — PSA, TOTAL AND FREE
PSA, Free Pct: 50 %
PSA, Free: 0.45 ng/mL
Prostate Specific Ag, Serum: 0.9 ng/mL (ref 0.0–4.0)

## 2023-09-22 ENCOUNTER — Telehealth: Payer: Self-pay | Admitting: Family Medicine

## 2023-09-22 ENCOUNTER — Other Ambulatory Visit: Payer: Self-pay | Admitting: Student

## 2023-09-22 ENCOUNTER — Encounter: Payer: Self-pay | Admitting: Hematology

## 2023-09-22 ENCOUNTER — Other Ambulatory Visit (HOSPITAL_COMMUNITY): Payer: Self-pay

## 2023-09-22 ENCOUNTER — Other Ambulatory Visit: Payer: Self-pay

## 2023-09-22 DIAGNOSIS — G893 Neoplasm related pain (acute) (chronic): Secondary | ICD-10-CM

## 2023-09-22 MED ORDER — APIXABAN 5 MG PO TABS
5.0000 mg | ORAL_TABLET | Freq: Two times a day (BID) | ORAL | 3 refills | Status: DC
  Filled 2023-09-22: qty 180, 90d supply, fill #0

## 2023-09-22 MED ORDER — OXYCODONE HCL 5 MG PO TABS
5.0000 mg | ORAL_TABLET | Freq: Three times a day (TID) | ORAL | 0 refills | Status: DC | PRN
  Filled 2023-09-22: qty 60, 20d supply, fill #0

## 2023-09-22 NOTE — Congregational Nurse Program (Signed)
  Dept: [phone]   Congregational Nurse Program Note  Date of Encounter: 09/22/2023  Past Medical History: Past Medical History:  Diagnosis Date   Back pain    Hypertension    Prostate cancer [facility])     Encounter Details:  Community Questionnaire - 09/22/23 1240       Questionnaire   Ask client: Do you give verbal consent for me to treat you today? Yes    Student Assistance N/A    Location Patient Served  [facility]    Encounter Setting CN site    Population Status Migrant/Refugee    Insurance Uninsured ([place] Card/Care Connects/Self-Pay/Medicaid Family Planning)    Insurance/Financial Assistance Referral Charitable Care    Medication Have Medication Insecurities;Patient Medications Reviewed;Provided Medication Assistance;Referred to Medication Assistance    Medical Provider Yes    Screening Referrals Made N/A    Medical Referrals Made [facility]    Medical Appointment Completed N/A    CNP Interventions Advocate/Support;Navigate Healthcare System;Counsel;Case Management;Educate    Screenings CN Performed N/A    ED Visit Averted N/A    Life-Saving Intervention Made N/A         Patient brought in medication for review.He has ran out Eliquis  and Oxycodone .The previous Eliquis  refill order on 09/07/23 did not through despite  Dr authorizing refill.I will follow up with [facility] pharmacy.  Naomie Versa Craton RN BSN PCCN  [facility] [phone]-cell [phone]-office

## 2023-09-22 NOTE — Telephone Encounter (Signed)
-----   Message from Nurse Naomie HERO sent at 09/22/2023 12:11 PM EDT ----- Regarding: Medication refill Hi Dr Delores, It looks like the Eliquis  refill on 7/1 failed to go through. Pharmacy said the refill was denied by provider which could be an error in the system. His Symbicort  refill went through so, I want to believe his medicaid is active although he has not received a card yet. Any way pharmacy has sent request for Eliquis  and oxycodone .  Dorothy.

## 2023-09-24 ENCOUNTER — Other Ambulatory Visit (HOSPITAL_COMMUNITY): Payer: Self-pay

## 2023-09-29 ENCOUNTER — Other Ambulatory Visit (HOSPITAL_COMMUNITY): Payer: Self-pay

## 2023-09-29 NOTE — Congregational Nurse Program (Signed)
  Dept: [phone]   Congregational Nurse Program Note  Date of Encounter: 09/29/2023  Past Medical History: Past Medical History:  Diagnosis Date   Back pain    Hypertension    Prostate cancer [facility])     Encounter Details:  Community Questionnaire - 09/29/23 1054       Questionnaire   Ask client: Do you give verbal consent for me to treat you today? Yes    Chiropractor    Location Patient Served  [facility]    Encounter Setting CN site    Population Status Migrant/Refugee    Insurance Uninsured ([employer] Card/Care Connects/Self-Pay/Medicaid Family Planning)    Insurance/Financial Assistance Referral Charitable Care    Medication Have Medication Insecurities;Patient Medications Reviewed;Provided Medication Assistance;Referred to Medication Assistance    Medical Provider Yes    Screening Referrals Made N/A    Medical Referrals Made [facility]    Medical Appointment Completed N/A    CNP Interventions Advocate/Support;Navigate Healthcare System;Counsel;Case Management;Educate    Screenings CN Performed Blood Pressure    ED Visit Averted N/A    Life-Saving Intervention Made N/A        Patient came in for blood pressure check. He also brought his medications Xarelto, Oxycodon, and Symbicort . Education provided on how to use these medications. Patient verbalized understanding. He is compliant with his medications. Reviewed upcoming appointments.   Naomie Adaria Hole RN BSN PCCN  [facility] [phone]-cell [phone]-office

## 2023-09-29 NOTE — Congregational Nurse Program (Deleted)
  Dept: [phone]   Congregational Nurse Program Note  Date of Encounter: 09/29/2023  Past Medical History: Past Medical History:  Diagnosis Date   Back pain    Hypertension    Prostate cancer [facility])     Encounter Details:  Community Questionnaire - 09/29/23 1054       Questionnaire   Ask client: Do you give verbal consent for me to treat you today? Yes    Chiropractor    Location Patient Served  [facility]    Encounter Setting CN site    Population Status Migrant/Refugee    Insurance Uninsured ([place] Card/Care Connects/Self-Pay/Medicaid Family Planning)    Insurance/Financial Assistance Referral Charitable Care    Medication Have Medication Insecurities;Patient Medications Reviewed;Provided Medication Assistance;Referred to Medication Assistance    Medical Provider Yes    Screening Referrals Made N/A    Medical Referrals Made [facility]    Medical Appointment Completed N/A    CNP Interventions Advocate/Support;Navigate Healthcare System;Counsel;Case Management;Educate    Screenings CN Performed Blood Pressure    ED Visit Averted N/A    Life-Saving Intervention Made N/A

## 2023-10-05 ENCOUNTER — Telehealth: Payer: Self-pay

## 2023-10-05 ENCOUNTER — Ambulatory Visit (INDEPENDENT_AMBULATORY_CARE_PROVIDER_SITE_OTHER): Admitting: Family Medicine

## 2023-10-05 VITALS — BP 135/77 | HR 81 | Ht 69.0 in | Wt 124.2 lb

## 2023-10-05 DIAGNOSIS — M25521 Pain in right elbow: Secondary | ICD-10-CM | POA: Diagnosis present

## 2023-10-05 DIAGNOSIS — H7292 Unspecified perforation of tympanic membrane, left ear: Secondary | ICD-10-CM

## 2023-10-05 DIAGNOSIS — Z23 Encounter for immunization: Secondary | ICD-10-CM | POA: Diagnosis not present

## 2023-10-05 DIAGNOSIS — H6121 Impacted cerumen, right ear: Secondary | ICD-10-CM

## 2023-10-05 DIAGNOSIS — J449 Chronic obstructive pulmonary disease, unspecified: Secondary | ICD-10-CM | POA: Diagnosis not present

## 2023-10-05 DIAGNOSIS — Z59819 Housing instability, housed unspecified: Secondary | ICD-10-CM | POA: Diagnosis not present

## 2023-10-05 MED ORDER — DICLOFENAC SODIUM 1 % EX GEL: 4.0000 g | Freq: Four times a day (QID) | CUTANEOUS | 11 refills | Status: AC

## 2023-10-05 NOTE — Progress Notes (Signed)
 SUBJECTIVE:   CHIEF COMPLAINT / HPI:   Kinyarwanda interpretor Gilbert used for entirety of encounter.   Patient is here for a chief complaint of elbow pain.  Right Elbow Pain Patient reports constant Right medial epicondyle elbow pain for the past few months that is worse when picking up heavy objects. 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 He denies increased pain with active or passive ROM, swelling, redness, or trauma. Patient has chronic back pain and takes ~3 oxycodone  daily and notes that he still has breakthrough elbow pain.    Possible COPD vs bronchitis Patient was seen recently for his SOB and followed instructions to take his symbicort  twice daily and utilize the albuterol  inhaler as a rescue inhaler for when he feels SOB due to weather changes or physical activity. Patient feels that his SOB is much better since making these changes. Patient denies any recent COPD exacerbation, chest pain, dizziness, fainting, or wheezes.  Denies fevers, sputum production, cough.  Decreased hearing Patient states that hearing in the left ear is still reduced, but feels it is slowly improving. He still struggles to hear clearly on the left side. In contrast, hearing in the right ear is much better after cerumen irrigation and he reports no current issues on that side. He denies ear pain, drainage, or tinnitus.  Housing Insecurity Patient states he is worried about being evicted soon and was unable to pay rent this past month. He does not report food insecurity, sharing that he has SNAP benefits which he says are more than enough to meet his nutritional needs.   PERTINENT  PMH / PSH: metastatic prostate cancer on Xtandi  daily, zolendronic acid monthly, and Eligard  injection every 3 months, HTN, bilateral low back pain, history of possible PE on Eliquis    OBJECTIVE:   BP  135/77   Pulse 81   Ht 5' 9 (1.753 m)   Wt 124 lb 3.2 oz (56.3 kg)   SpO2 100%   BMI 18.34 kg/m   General: Well-appearing, in no acute distress HEENT: Left ear: visible perforated tympanic membrane, no drainage or erythema noted Right ear: cerumen impaction partially obscuring view of tympanic membrane Cardiac: Regular rate and rhythm; no murmurs appreciated Respiratory: Clear to auscultation bilaterally; no wheezes, rales, or rhonchi MSK: Right elbow non-erythematous, no warmth or swelling. Mild tenderness to palpation over medial epicondyle. Full range of motion on flexion and extension.    ASSESSMENT/PLAN:   Assessment & Plan Right elbow pain - Chronic elbow pain, no fractures or dislocation noted on January xray, exam consistent with possible medial epicondylitis - Start diclofenac  gel QID PRN - Consider trial of elbow brace or PT if pain persists.  Housing insecurity - Patient concerned about impending eviction due to missed rent.  - Congregational nurse consulted on state resources but unfortunately they are limited in their capacity and he has already been referred to [facility] for assistance Chronic obstructive pulmonary disease, unspecified COPD type (HCC) - improved on BID Symbicort , no signs of exacerbation today - Continue current regimen of Symbicort  BID + albuterol  PRN Perforated tympanic membrane on examination, left - Slowly improving per patient, but still decreased hearing. No signs of infection or drainage - continue conservative management, avoid qtips in ear. Impacted cerumen of right ear - Cerumen impaction partially obstructing TM, improved since last visit - Avoid cleaning  with qtips, trial OTC Debrox if problem persists.  Need for prophylactic vaccination against hepatitis A - Vaccine administered in clinic   Nonda Carrie, Medical Student [facility]  Attestation of Supervision of Student:  I confirm that I have verified the  information documented in the medical student's note and that I have also personally performed the history, physical exam and all medical decision making activities.  I have verified that all services and findings are accurately documented in this student's note; and I agree with management and plan as outlined in the documentation. I have also made any necessary editorial changes.    Rollene FORBES Keeling, MD [facility] 10/05/2023 4:40 PM

## 2023-10-05 NOTE — Assessment & Plan Note (Addendum)
-   Chronic elbow pain, no fractures or dislocation noted on January xray, exam consistent with possible medial epicondylitis - Start diclofenac  gel QID PRN - Consider trial of elbow brace or PT if pain persists.

## 2023-10-05 NOTE — Telephone Encounter (Signed)
 Patient called with appointment reminder.Transportation assistance provided.Patient advised to bring along his medications to the appointment  Naomie Aly RN BSN PCCN  [facility] [phone]-cell [phone]-office

## 2023-10-05 NOTE — Assessment & Plan Note (Addendum)
-   improved on BID Symbicort , no signs of exacerbation today - Continue current regimen of Symbicort  BID + albuterol  PRN

## 2023-10-05 NOTE — Assessment & Plan Note (Addendum)
-   Patient concerned about impending eviction due to missed rent.  - Congregational nurse consulted on state resources but unfortunately they are limited in their capacity and he has already been referred to [facility] for assistance

## 2023-10-05 NOTE — Patient Instructions (Addendum)
 It was wonderful to see you today.  Please bring ALL of your medications with you to every visit.   Today we talked about:  -We scheduled you follow up on Sept 2 at 2:00 pm  We sent in diclofenac  gel  Thank you for choosing [facility].   Please call [phone] with any questions about today's appointment.  Please arrive at least 15 minutes prior to your scheduled appointments.   If you had blood work today, I will send you a MyChart message or a letter if results are normal. Otherwise, I will give you a call.   If you had a referral placed, they will call you to set up an appointment. Please give us  a call if you don't hear back in the next 2 weeks.   If you need additional refills before your next appointment, please call your pharmacy first.  Don't forget to check out the [facility] in [facility] at [address] [phone] Affordable prices on prescriptions and over-the-counter items, as well as services like vaccinations and medication home delivery.   Rollene Keeling, MD  Family Medicine

## 2023-10-06 NOTE — Congregational Nurse Program (Signed)
  Dept: [phone]   Congregational Nurse Program Note  Date of Encounter: 10/06/2023  Past Medical History: Past Medical History:  Diagnosis Date   Back pain    Hypertension    Prostate cancer [facility])     Encounter Details:  Community Questionnaire - 10/06/23 1205       Questionnaire   Ask client: Do you give verbal consent for me to treat you today? Yes    Chiropractor    Location Patient Served  [facility]    Encounter Setting CN site    Population Status Migrant/Refugee    Insurance Uninsured ([employer]/Care Connects/Self-Pay/Medicaid Family Planning)    Insurance/Financial Assistance Referral Charitable Care    Medication Have Medication Insecurities;Patient Medications Reviewed;Provided Medication Assistance;Referred to Medication Assistance    Medical Provider Yes    Screening Referrals Made N/A    Medical Referrals Made [facility]    Medical Appointment Completed N/A    CNP Interventions Advocate/Support;Navigate Healthcare System;Counsel;Case Management;Educate    Screenings CN Performed Blood Pressure    ED Visit Averted N/A    Life-Saving Intervention Made N/A         I have provided bus passes for patient to use for appointments while I am out of office 7/31-8/19. Case manager will assist patient to navigate the bus routes.   Naomie Lind Ausley RN BSN PCCN  [facility] [phone]-cell [phone]-office

## 2023-10-12 ENCOUNTER — Encounter: Payer: Self-pay | Admitting: Hematology

## 2023-10-14 ENCOUNTER — Other Ambulatory Visit: Payer: Self-pay

## 2023-10-14 DIAGNOSIS — C61 Malignant neoplasm of prostate: Secondary | ICD-10-CM

## 2023-10-15 ENCOUNTER — Encounter: Payer: Self-pay | Admitting: Hematology

## 2023-10-15 ENCOUNTER — Inpatient Hospital Stay: Payer: Self-pay

## 2023-10-15 ENCOUNTER — Other Ambulatory Visit (HOSPITAL_COMMUNITY): Payer: Self-pay

## 2023-10-15 ENCOUNTER — Inpatient Hospital Stay: Payer: Self-pay | Attending: Hematology | Admitting: Hematology

## 2023-10-15 VITALS — BP 121/82 | HR 78 | Temp 97.7°F | Resp 18 | Wt 128.1 lb

## 2023-10-15 DIAGNOSIS — Z87891 Personal history of nicotine dependence: Secondary | ICD-10-CM | POA: Diagnosis not present

## 2023-10-15 DIAGNOSIS — R918 Other nonspecific abnormal finding of lung field: Secondary | ICD-10-CM | POA: Diagnosis not present

## 2023-10-15 DIAGNOSIS — Z7901 Long term (current) use of anticoagulants: Secondary | ICD-10-CM | POA: Diagnosis not present

## 2023-10-15 DIAGNOSIS — C7951 Secondary malignant neoplasm of bone: Secondary | ICD-10-CM | POA: Diagnosis not present

## 2023-10-15 DIAGNOSIS — I272 Pulmonary hypertension, unspecified: Secondary | ICD-10-CM | POA: Diagnosis not present

## 2023-10-15 DIAGNOSIS — D649 Anemia, unspecified: Secondary | ICD-10-CM | POA: Insufficient documentation

## 2023-10-15 DIAGNOSIS — C61 Malignant neoplasm of prostate: Secondary | ICD-10-CM | POA: Diagnosis not present

## 2023-10-15 DIAGNOSIS — G893 Neoplasm related pain (acute) (chronic): Secondary | ICD-10-CM | POA: Insufficient documentation

## 2023-10-15 DIAGNOSIS — E43 Unspecified severe protein-calorie malnutrition: Secondary | ICD-10-CM | POA: Diagnosis not present

## 2023-10-15 LAB — CBC WITH DIFFERENTIAL (CANCER CENTER ONLY)
Abs Immature Granulocytes: 0.02 K/uL (ref 0.00–0.07)
Basophils Absolute: 0 K/uL (ref 0.0–0.1)
Basophils Relative: 1 %
Eosinophils Absolute: 0.3 K/uL (ref 0.0–0.5)
Eosinophils Relative: 4 %
HCT: 41.6 % (ref 39.0–52.0)
Hemoglobin: 14 g/dL (ref 13.0–17.0)
Immature Granulocytes: 0 %
Lymphocytes Relative: 57 %
Lymphs Abs: 3.8 K/uL (ref 0.7–4.0)
MCH: 28.3 pg (ref 26.0–34.0)
MCHC: 33.7 g/dL (ref 30.0–36.0)
MCV: 84.2 fL (ref 80.0–100.0)
Monocytes Absolute: 0.4 K/uL (ref 0.1–1.0)
Monocytes Relative: 5 %
Neutro Abs: 2.2 K/uL (ref 1.7–7.7)
Neutrophils Relative %: 33 %
Platelet Count: 175 K/uL (ref 150–400)
RBC: 4.94 MIL/uL (ref 4.22–5.81)
RDW: 13.2 % (ref 11.5–15.5)
WBC Count: 6.6 K/uL (ref 4.0–10.5)
nRBC: 0 % (ref 0.0–0.2)

## 2023-10-15 LAB — CMP (CANCER CENTER ONLY)
ALT: 10 U/L (ref 0–44)
AST: 18 U/L (ref 15–41)
Albumin: 3.9 g/dL (ref 3.5–5.0)
Alkaline Phosphatase: 50 U/L (ref 38–126)
Anion gap: 4 — ABNORMAL LOW (ref 5–15)
BUN: 11 mg/dL (ref 8–23)
CO2: 30 mmol/L (ref 22–32)
Calcium: 9.1 mg/dL (ref 8.9–10.3)
Chloride: 103 mmol/L (ref 98–111)
Creatinine: 0.76 mg/dL (ref 0.61–1.24)
GFR, Estimated: >60 mL/min (ref >60)
Glucose, Bld: 80 mg/dL (ref 70–99)
Potassium: 3.7 mmol/L (ref 3.5–5.1)
Sodium: 137 mmol/L (ref 135–145)
Total Bilirubin: 0.3 mg/dL (ref 0.0–1.2)
Total Protein: 8.3 g/dL — ABNORMAL HIGH (ref 6.5–8.1)

## 2023-10-15 MED ORDER — ZOLEDRONIC ACID 4 MG/100ML IV SOLN
4.0000 mg | Freq: Once | INTRAVENOUS | Status: AC
  Administered 2023-10-15: 4 mg via INTRAVENOUS
  Filled 2023-10-15: qty 100

## 2023-10-15 MED ORDER — SODIUM CHLORIDE 0.9 % IV SOLN: INTRAVENOUS | Status: DC

## 2023-10-15 NOTE — Patient Instructions (Signed)

## 2023-10-19 LAB — PSA, TOTAL AND FREE
PSA, Free Pct: 45.7 %
PSA, Free: 0.32 ng/mL
Prostate Specific Ag, Serum: 0.7 ng/mL (ref 0.0–4.0)

## 2023-10-22 ENCOUNTER — Encounter: Payer: Self-pay | Admitting: Hematology

## 2023-10-22 NOTE — Progress Notes (Signed)
 HEMATOLOGY/ONCOLOGY CLINIC NOTE  Date of Service: 07/23/2023  Patient Care Team: Delores Suzann HERO, MD as PCP - General (Family Medicine) Vertell Pont, RN as Oncology Nurse Navigator  CHIEF COMPLAINTS/PURPOSE OF CONSULTATION:  Follow-up for continued evaluation and management of metastatic prostate cancer  HISTORY OF PRESENTING ILLNESS:  Arthur Ali is a wonderful 63 y.o. male who is being seen for evaluation and management of likely metastatic prostate cancer based on elevated PSA levels.     Patient was previously seen by me in 2021 for lymphocytosis thought to be a reactive process. At that time, he had no signs of monoclonal lymphocytes. He was also vitamin B12 deficient at that time and his pseudothrombocytopenia had resolved.    Patient presented to the hospital with worsening dyspnea chest pain night sweats loss of appetite and unintentional weight loss and will directly admitted from the family medicine clinic.  CT of the chest done on 11/19/2022 showed no evidence of pulmonary embolism but did show pathologic thoracic adenopathy within the right paratracheal and subcarinal lymph node groups as well as widespread sclerotic metastatic disease throughout the visualized axial skeleton.  He was noted to have a 6 mm noncalcified pulmonary nodule in the right upper lobe.  Also noted to have mild central pulmonary artery enlargement with suggestions of elevated right heart pressure. He also had a x-ray of the lumbar spine which showed degenerative change of the lumbar spine and diffuse mottled appearance of the femoral heads and pelvis which could be concerning for metastatic disease.   Patient had a PSA test which showed significant elevation to nearly 1500 being highly suggestive of metastatic prostate cancer. TB QuantiFERON test is currently pending.   Patient CBC today shows normal WBC count of 10.2k with anemia with a hemoglobin of 10.5 and a platelet count of 174k. CMP shows  hypocalcemia with calcium of 7.9, decreased albumin of 1.8 and alkaline phosphatase of 500. Normal transaminases and bilirubin.  Creatinine is within normal limits at 0.76.   HIV test is nonreactive TSH was within normal limits at 1.85 Multiple myeloma panel and K/L FLC are currently pending.   Patient was seen in airborne isolation with the help of video [place] interpreter Octavio).  He notes that he has lost 10 to 15 kg of body weight with significantly decreased p.o. intake.  Notes decreased urinary flow about a week ago but is a little better now.  No overt hematuria.  Notes some issues with constipation. Has had diffuse bodyaches especially over the spine and pelvis.   Also notes some shortness of breath. Chest wall pain noted. Notes no new upper or lower extremity weakness, no loss of bowel or bladder control.  INTERVAL HISTORY:  [name] is a wonderful 63 y.o. male who is here for continue evaluation and management of metastatic prostate cancer.  He was seen with his [place] interpreter. She notes that he is still concerned about his housing situation and is working with his Tourist information centre manager. He notes no acute new bone pains, no shortness of breath or chest pain. Notes improved p.o. intake and has gained about 4 pounds in the last month. Notes stable energy levels.  Notes that he has been compliant with his Xtandi .  No new focal bone pains.  No infection issues.  No new headaches or focal neurological deficits. Has been moving his bowels regularly.  Notes no issues with passing urine or hematuria. Expresses gratitude for all the help he has received in the cancer  center.  MEDICAL HISTORY:  Past Medical History:  Diagnosis Date   Back pain    Hypertension    Prostate cancer (HCC)     SURGICAL HISTORY: No past surgical history on file.  SOCIAL HISTORY: Social History   Socioeconomic History   Marital status: Married    Spouse name: Not on file    Number of children: Not on file   Years of education: Not on file   Highest education level: Not on file  Occupational History   Not on file  Tobacco Use   Smoking status: Former    Types: Cigars, Cigarettes   Smokeless tobacco: Never   Tobacco comments:    Quit in 2022.   Vaping Use   Vaping status: Never Used  Substance and Sexual Activity   Alcohol use: Never   Drug use: Never   Sexual activity: Not Currently  Other Topics Concern   Not on file  Social History Narrative   Single applicant from [place]   No family here or in [place]       Refugee Information   Number of Immediate Family Members: 0   Number of Immediate Family Members in [place] : 0   Country of Birth: [facility]   Country of Origin: [facility]   Location of Refugee Camp: [place]   Duration in [place]: 20 years or greater   Reason for Leaving Home Country: Political opinion   Primary Language: Swahili/Kiswahili, Kinyarwanda/Rwanda   Able to Read in Primary Language: Yes   Able to Write in Primary Language: Yes   Education: Primary School   Prior Work: No previous jobs   Marital Status: Other (Wife and children decreased)   Sexual Activity: No   Tuberculosis Screening Overseas: Positive   Tuberculosis Screening Health Department: Not Completed   Health Department Labs Completed: Yes   History of Trauma: None   Do You Feel Jumpy or Nervous?: No   Are You Very Watchful or 'Super Alert'?: No   Social Drivers of Health   Financial Resource Strain: High Risk (05/05/2023)   Overall Financial Resource Strain (CARDIA)    Difficulty of Paying Living Expenses: Very hard  Food Insecurity: Food Insecurity Present (08/23/2023)   Hunger Vital Sign    Worried About Running Out of Food in the Last Year: Sometimes true    Ran Out of Food in the Last Year: Sometimes true  Transportation Needs: Unmet Transportation Needs (06/25/2023)   PRAPARE - Administrator, Civil Service (Medical): Yes    Lack of Transportation (Non-Medical):  Yes  Physical Activity: Not on file  Stress: Stress Concern Present (05/04/2023)   [employer] of Occupational Health - Occupational Stress Questionnaire    Feeling of Stress : Very much  Social Connections: Unknown (06/26/2023)   Social Connection and Isolation Panel    Frequency of Communication with Friends and Family: Patient unable to answer    Frequency of Social Gatherings with Friends and Family: Patient unable to answer    Attends Religious Services: Patient unable to answer    Active Member of Clubs or Organizations: Patient unable to answer    Attends Banker Meetings: Patient unable to answer    Marital Status: Married  Catering manager Violence: Not At Risk (06/25/2023)   Humiliation, Afraid, Rape, and Kick questionnaire    Fear of Current or Ex-Partner: No    Emotionally Abused: No    Physically Abused: No    Sexually Abused: No    FAMILY HISTORY: Family  History  Family history unknown: Yes    ALLERGIES:  has no known allergies.  MEDICATIONS:  Current Outpatient Medications  Medication Sig Dispense Refill   albuterol  (VENTOLIN  HFA) 108 (90 Base) MCG/ACT inhaler Inhale 2 puffs into the lungs every 4 (four) hours as needed for wheezing or shortness of breath. 6.7 g 0   amLODipine  (NORVASC ) 10 MG tablet Take 1 tablet (10 mg total) by mouth at bedtime. 90 tablet 1   apixaban  (ELIQUIS ) 5 MG TABS tablet Take 1 tablet (5 mg total) by mouth 2 (two) times daily. 180 tablet 3   budesonide -formoterol  (SYMBICORT ) 160-4.5 MCG/ACT inhaler Inhale 2 puffs into the lungs 2 (two) times daily. 10.2 g 2   enzalutamide  (XTANDI ) 80 MG tablet Take 2 tablets (160 mg total) by mouth daily. 60 tablet 11   oxyCODONE  (OXY IR/ROXICODONE ) 5 MG immediate release tablet Take 1 tablet (5 mg total) by mouth every 8 (eight) hours as needed for severe pain (pain score 7-10). 60 tablet 0   polyethylene glycol powder (GLYCOLAX /MIRALAX ) 17 GM/SCOOP powder Take 17 g by mouth 2 (two) times  daily as needed. 3350 g 1   diclofenac  Sodium (VOLTAREN ) 1 % GEL Apply 4 g topically 4 (four) times daily. To affected joint. 100 g 11   No current facility-administered medications for this visit.    REVIEW OF SYSTEMS:    10 Point review of Systems was done is negative except as noted above.  PHYSICAL EXAMINATION: ECOG PERFORMANCE STATUS: 2 - Symptomatic, <50% confined to bed  . Vitals:   10/15/23 1425  BP: 121/82  Pulse: 78  Resp: 18  Temp: 97.7 F (36.5 C)  SpO2: 95%   Filed Weights   10/15/23 1425  Weight: 128 lb 1.6 oz (58.1 kg)   .Body mass index is 18.92 kg/m.  NAD GENERAL:alert, in no acute distress and comfortable SKIN: no acute rashes, no significant lesions EYES: conjunctiva are pink and non-injected, sclera anicteric OROPHARYNX: MMM, no exudates, no oropharyngeal erythema or ulceration NECK: supple, no JVD LYMPH:  no palpable lymphadenopathy in the cervical, axillary or inguinal regions LUNGS: clear to auscultation b/l with normal respiratory effort HEART: regular rate & rhythm ABDOMEN:  normoactive bowel sounds , non tender, not distended. Extremity: no pedal edema PSYCH: alert & oriented x 3 with fluent speech NEURO: no focal motor/sensory deficits   LABORATORY DATA:  I have reviewed the data as listed  .    Latest Ref Rng & Units 10/15/2023    1:15 PM 09/17/2023    1:27 PM 08/23/2023    8:33 AM  CBC  WBC 4.0 - 10.5 K/uL 6.6  7.1  6.9   Hemoglobin 13.0 - 17.0 g/dL 85.9  85.9  85.2   Hematocrit 39.0 - 52.0 % 41.6  41.4  44.1   Platelets 150 - 400 K/uL 175  177  185     .    Latest Ref Rng & Units 10/15/2023    1:15 PM 09/17/2023    1:27 PM 08/23/2023    8:33 AM  CMP  Glucose 70 - 99 mg/dL 80  92  885   BUN 8 - 23 mg/dL 11  17  13    Creatinine 0.61 - 1.24 mg/dL 9.23  9.12  9.12   Sodium 135 - 145 mmol/L 137  140  142   Potassium 3.5 - 5.1 mmol/L 3.7  3.4  3.8   Chloride 98 - 111 mmol/L 103  103  107   CO2 22 -  32 mmol/L 30  32  29    Calcium 8.9 - 10.3 mg/dL 9.1  9.4  9.2   Total Protein 6.5 - 8.1 g/dL 8.3  8.4  8.6   Total Bilirubin 0.0 - 1.2 mg/dL 0.3  0.4  0.3   Alkaline Phos 38 - 126 U/L 50  50  60   AST 15 - 41 U/L 18  20  19    ALT 0 - 44 U/L 10  11  11     Prostate Specific Ag, Serum 0.7 0.9 CM QNSREP R, CM 1.6 CM 2.2 CM 5.0 High  CM 5.3 High  CM   Component     Latest Ref Rng 11/20/2022 11/21/2022  Prostatic Specific Antigen     0.00 - 4.00 ng/mL 1,444.22 (H)    Vitamin D , 25-Hydroxy     30 - 100 ng/mL  33.22     Legend: (H) High  SURGICAL PATHOLOGY  CASE: [id number]  PATIENT: LONZIE ALPERS  Surgical Pathology Report      Clinical History: sclerotic changes/infiltration, anticipated prostate  mets (cm)      FINAL MICROSCOPIC DIAGNOSIS:   A. BONE, RIGHT POSTERIOR ILIAC, BIOPSY:  -  Metastatic carcinoma, consistent with prostate origin.   Note: The biopsy of bone with extensive bone marrow fibrosis/sclerosis.  Embedded in the sclerotic stroma are extremely crushed cells without  identifiable morphology; however, the clinical impression of metastatic  prostate carcinoma is noted and confirmatory immunohistochemical stains  highlight these crushed cells of interest as positive for NKX3.1  consistent with metastatic carcinoma of prostate primary.  Dr. Frutoso is  peer-reviewed the case and agrees with the interpretation.    RADIOGRAPHIC STUDIES: I have personally reviewed the radiological images as listed and agreed with the findings in the report. No results found.   ASSESSMENT & PLAN:   63 y.o. male from [place] who primarily speaks Kinyarwanda (very little [place]) presenting with shortness of breath, failure to thrive and generalized body pains   1,Newly diagnosed Metastatic prostate cancer with extensive skeletal lesions and thoracic adenopathy. Stage 5.  PSA level nearly 1500.   2.  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   NO evidence of  monoclonal paraproteinemia to suggestive primary bone marrow disorder.   3.  Mild lymphocytosis lymphocyte count of 5.2k previously noted to be nonclonal.  Likely from chronic inflammation/reactive.  Previously has been as high as 9.2k   4.  Findings of pulmonary hypertension on CTA--echocardiogram  with normal EF 60-65%, grade 1 diastolic dysfunction   5.  Pulmonary embolism -- possible-- on ELiquis    6.  Severe protein calorie malnutrition with weight loss of 30 to 40 pounds due to his metastatic prostate cancer.  7. Cancer related pain  PLAN:  - Discussed lab results from labs on 10/15/2023 in detail with the patient. CBC is within normal limits with a hemoglobin of 14 and normal WBC count and platelets CMP is within normal limits PSA continues to improve and is down to 0.7 Patient notes no notable toxicity from Xtandi  Will continue his Xtandi  at 160 mg p.o. daily Continue Zometa  4 mg every month Continue Eligard  every 3 months Continue Eliquis  for VTE prophylaxis given his previous history of pulmonary embolism and risk of recurrent VTE in the context of metastatic prostate cancer. -PM discussed with the patient that he should try to continue eating as much as he can. -Patient continues to remain off his cigarette smoking - Has been consistent with the status of his  housing and he is working with his community Child psychotherapist to try to figure that out.  FOLLOW-UP: Continue monthly zometa  Continue every 3 monthly Eligard  RTC with Dr Onesimo with labs in 3 months  The total time spent in the appointment was 30 minutes*.  All of the patient's questions were answered with apparent satisfaction. The patient knows to call the clinic with any problems, questions or concerns.   Emaline Onesimo MD MS AAHIVMS [facility] Hematology/Oncology Physician [facility]  .*Total Encounter Time as defined by the Centers for Medicare and Medicaid Services includes, in addition to the  face-to-face time of a patient visit (documented in the note above) non-face-to-face time: obtaining and reviewing outside history, ordering and reviewing medications, tests or procedures, care coordination (communications with other health care professionals or caregivers) and documentation in the medical record.

## 2023-10-28 ENCOUNTER — Other Ambulatory Visit: Payer: Self-pay | Admitting: Student

## 2023-10-28 ENCOUNTER — Other Ambulatory Visit: Payer: Self-pay

## 2023-10-28 ENCOUNTER — Other Ambulatory Visit (HOSPITAL_COMMUNITY): Payer: Self-pay

## 2023-10-28 DIAGNOSIS — I10 Essential (primary) hypertension: Secondary | ICD-10-CM

## 2023-10-28 MED ORDER — AMLODIPINE BESYLATE 10 MG PO TABS
10.0000 mg | ORAL_TABLET | Freq: Every day | ORAL | 1 refills | Status: DC
  Filled 2023-10-28: qty 90, 90d supply, fill #0

## 2023-10-28 NOTE — Congregational Nurse Program (Addendum)
  Dept: [phone]   Congregational Nurse Program Note  Date of Encounter: 10/28/2023  Past Medical History: Past Medical History:  Diagnosis Date   Back pain    Hypertension    Prostate cancer [facility])     Encounter Details:  Community Questionnaire - 10/28/23 1057       Questionnaire   Ask client: Do you give verbal consent for me to treat you today? Yes    Student Assistance N/A    Location Patient Served  [facility]    Encounter Setting Phone/Text/Email    Population Status Migrant/Refugee    Insurance Medicaid    Insurance/Financial Assistance Referral Charitable Care    Medication Have Medication Insecurities;Patient Medications Reviewed;Provided Medication Assistance;Referred to Medication Assistance    Medical Provider Yes    Screening Referrals Made N/A    Medical Referrals Made [facility]    Medical Appointment Completed N/A    CNP Interventions Advocate/Support;Navigate Healthcare System;Counsel;Case Management;Educate    Screenings CN Performed N/A    ED Visit Averted N/A    Life-Saving Intervention Made N/A         I have contacted [facility] for medication refill. Medication will be delivered to his home.   Patient continues to experience housing insecurities. He is facing eviction and getting assistance from [employer] legal Aid.  I have confirmed with [facility] case manager that patient is approved for SSI benefits $967 monthly.Patient has received some mail but is not sure what it is for due to language barrier. Patient has been asked to take the mail to [facility] for assistance. He has food stumps,medicaid and he is up to date with utility bills. Case manager at [facility] will assist with low income housing application.    Naomie Janeil Schexnayder RN BSN PCCN  [facility] [phone] [phone]-office

## 2023-10-29 ENCOUNTER — Other Ambulatory Visit (HOSPITAL_COMMUNITY): Payer: Self-pay

## 2023-10-29 ENCOUNTER — Other Ambulatory Visit: Payer: Self-pay

## 2023-10-29 DIAGNOSIS — G893 Neoplasm related pain (acute) (chronic): Secondary | ICD-10-CM

## 2023-10-29 MED ORDER — ALBUTEROL SULFATE HFA 108 (90 BASE) MCG/ACT IN AERS: 2.0000 | INHALATION_SPRAY | RESPIRATORY_TRACT | 0 refills | Status: DC | PRN

## 2023-10-29 MED ORDER — OXYCODONE HCL 5 MG PO TABS
5.0000 mg | ORAL_TABLET | Freq: Three times a day (TID) | ORAL | 0 refills | Status: DC | PRN
Start: 2023-10-29 — End: 2023-12-10
  Filled 2023-10-29: qty 60, 20d supply, fill #0

## 2023-11-02 ENCOUNTER — Other Ambulatory Visit (HOSPITAL_COMMUNITY): Payer: Self-pay

## 2023-11-02 ENCOUNTER — Encounter: Payer: Self-pay | Admitting: Hematology

## 2023-11-02 ENCOUNTER — Telehealth: Payer: Self-pay

## 2023-11-02 ENCOUNTER — Other Ambulatory Visit: Payer: Self-pay

## 2023-11-02 DIAGNOSIS — C61 Malignant neoplasm of prostate: Secondary | ICD-10-CM

## 2023-11-02 NOTE — Telephone Encounter (Signed)
 Pt's Xtandi  ordered to be delivered to the [facility]

## 2023-11-03 ENCOUNTER — Other Ambulatory Visit (HOSPITAL_COMMUNITY): Payer: Self-pay

## 2023-11-03 ENCOUNTER — Other Ambulatory Visit (HOSPITAL_BASED_OUTPATIENT_CLINIC_OR_DEPARTMENT_OTHER): Payer: Self-pay

## 2023-11-03 ENCOUNTER — Other Ambulatory Visit: Payer: Self-pay

## 2023-11-03 NOTE — Congregational Nurse Program (Signed)
  Dept: [phone]   Congregational Nurse Program Note  Date of Encounter: 11/03/2023  Past Medical History: Past Medical History:  Diagnosis Date   Back pain    Hypertension    Prostate cancer [facility])     Encounter Details:  Community Questionnaire - 11/03/23 1231       Questionnaire   Ask client: Do you give verbal consent for me to treat you today? Yes    Student Assistance N/A    Location Patient Served  [facility]    Encounter Setting CN site    Population Status Migrant/Refugee    Insurance Medicaid    Insurance/Financial Assistance Referral Charitable Care    Medication Have Medication Insecurities;Patient Medications Reviewed;Provided Medication Assistance;Referred to Medication Assistance    Medical Provider Yes    Screening Referrals Made N/A    Medical Referrals Made [facility]CP[facility]inic    Medical Appointment Completed N/A    CNP Interventions Advocate/Support;Navigate Healthcare System;Counsel;Case Management;Educate    Screenings CN Performed N/A    ED Visit Averted N/A    Life-Saving Intervention Made N/A         I have contacted pharmacy and assisted patient to pay for his medication using his credit card.Oxycodone  will be delivered to his residence.I have reviewed all his medication and he is compliant. Application for housing assistance (elderly home) has been done. Patient will hand deliver the application package to the leasing office.  He continues to have housing insecurities.He is unable to pay his rent.Fortunately, his eviction lawsuit was dismissed. I will continue to assist as needed. Naomie Beulah Capobianco RN BSN PCCN  [facility] [phone]-cell [phone]-office

## 2023-11-05 ENCOUNTER — Telehealth: Payer: Self-pay | Admitting: Genetic Counselor

## 2023-11-05 NOTE — Telephone Encounter (Signed)
 I messaged Dr. Onesimo to let them know that Mr. Arthur Ali is recommended to have genetic testing due to their personal history of metastatic prostate cancer.  Philbert Ocallaghan, MS, [facility] Genetic Counselor [email or web address] (P) [phone]

## 2023-11-09 ENCOUNTER — Ambulatory Visit: Admitting: Family Medicine

## 2023-11-09 ENCOUNTER — Encounter: Payer: Self-pay | Admitting: Hematology

## 2023-11-09 VITALS — BP 121/82 | HR 88 | Ht 69.0 in | Wt 125.2 lb

## 2023-11-09 DIAGNOSIS — J449 Chronic obstructive pulmonary disease, unspecified: Secondary | ICD-10-CM | POA: Diagnosis not present

## 2023-11-09 DIAGNOSIS — H7292 Unspecified perforation of tympanic membrane, left ear: Secondary | ICD-10-CM | POA: Diagnosis not present

## 2023-11-09 DIAGNOSIS — Z23 Encounter for immunization: Secondary | ICD-10-CM | POA: Diagnosis not present

## 2023-11-09 DIAGNOSIS — I10 Essential (primary) hypertension: Secondary | ICD-10-CM | POA: Diagnosis not present

## 2023-11-09 DIAGNOSIS — H6121 Impacted cerumen, right ear: Secondary | ICD-10-CM

## 2023-11-09 DIAGNOSIS — M25521 Pain in right elbow: Secondary | ICD-10-CM

## 2023-11-09 DIAGNOSIS — Z59819 Housing instability, housed unspecified: Secondary | ICD-10-CM

## 2023-11-09 NOTE — Assessment & Plan Note (Signed)
 Patient concerned about ability to pay rent. Patient has been approved for disability per congregational nurse but unsure as to when he will begin receiving assistance. Sent a message to congregational nurse about status and possible referral to [facility] housing if needed. Updated patient on this as well.

## 2023-11-09 NOTE — Assessment & Plan Note (Signed)
 Normal work of breathing with some wheezing heard in upper lung fields. Likely due to seasonal allergies. Do not suspect this is COPD exacerbation given symptoms are well-controlled with inhalers.  - Continue Symbicort  BID - Continue Albuterol  PRN

## 2023-11-09 NOTE — Assessment & Plan Note (Signed)
 This is chronic. Previous imaging with no fractures or dislocation noted.  Likely tendinitis. - Continue diclofenac  gel QID

## 2023-11-09 NOTE — Assessment & Plan Note (Signed)
 Tympanic membrane fully healed on examination with no signs of infection or drainage. Patient states hearing is also improving. - Avoid Q-tips in the ear

## 2023-11-09 NOTE — Progress Notes (Signed)
    SUBJECTIVE:   CHIEF COMPLAINT / HPI:   Kinyarwanda interpretor Gilbert used for entirety of encounter.   Unable to perform medication reconciliation as patient did not bring medications to this visit.  Right elbow pain Patient R elbow pain with numbness/tingling in fingers that has been ongoing for some time now. Pain with radiation to the chest area about a month ago but no chest pain, difficulty breathing at the time.  COPD Patient reports difficulty breathing x3 days as the weather has changed. He is using his albuterol  inhaler twice a day with relief of symptoms. Denies cough, runny nose, sore throat, fever, chest pain, HA, blurry vision.  Decreased hearing Patient states difficulty hearing from left ear is improving though not at baseline  Housing insecurity Patient reports he has been unable to pay his rent for the last 2 months and has been taken to court. He was previously paying rent with his savings which have run out and he is unable to work at this time.  PERTINENT  PMH / PSH: metastatic prostate cancer on Xtandi  daily, zolendronic acid monthly, and Eligard  injection every 3 months, HTN, bilateral low back pain, history of possible PE on Eliquis    OBJECTIVE:   BP (!) 147/87   Pulse 88   Ht 5' 9 (1.753 m)   Wt 125 lb 3.2 oz (56.8 kg)   SpO2 97%   BMI 18.49 kg/m    General: Alert, in NAD.  HEENT:  Left ear: TM visualized with some erythema, perforation seems to have healed Right ear: cerumen impaction partially obscuring view of TM  Cardiovascular: RRR, S1/S2 normal. No murmurs, rubs, or gallops appreciated. Pulmonary: Normal work of breathing. Some wheezing heard in upper lung fields Abdomen: Soft, non-tender, non-distended. MSK: Right elbow non-erythematous, with no warmth or swelling. Mild tenderness to palpation over medial epicondyle. Full ROM. Neurologic: No focal deficits.    ASSESSMENT/PLAN:   Assessment & Plan Right elbow pain This is chronic.  Previous imaging with no fractures or dislocation noted.  Likely tendinitis. - Continue diclofenac  gel QID Chronic obstructive pulmonary disease, unspecified COPD type (HCC) Normal work of breathing with some wheezing heard in upper lung fields. Likely due to seasonal allergies. Do not suspect this is COPD exacerbation given symptoms are well-controlled with inhalers.  - Continue Symbicort  BID - Continue Albuterol  PRN Housing insecurity Patient concerned about ability to pay rent. Patient has been approved for disability per congregational nurse but unsure as to when he will begin receiving assistance. Sent a message to congregational nurse about status and possible referral to [facility] housing if needed. Updated patient on this as well. Essential hypertension Initial BP 147/87 with repeat 121/82 which is at goal. - Continue amlodipine  10mg  daily Perforated tympanic membrane, left Tympanic membrane fully healed on examination with no signs of infection or drainage. Patient states hearing is also improving. - Avoid Q-tips in the ear Impacted cerumen of right ear Cerumen impaction partially obstructing view of the tympanic membrane - Avoid cleaning with Q-tips Encounter for immunization Agreeable to flu vaccine today. Declining Shingles vaccine at this time. All other immunizations up to date per [facility]. - Administer flu vaccine  Darren Jernigan, DO [facility]

## 2023-11-09 NOTE — Assessment & Plan Note (Signed)
 Initial BP 147/87 with repeat 121/82 which is at goal. - Continue amlodipine  10mg  daily

## 2023-11-09 NOTE — Assessment & Plan Note (Signed)
 Cerumen impaction partially obstructing view of the tympanic membrane - Avoid cleaning with Q-tips

## 2023-11-09 NOTE — Congregational Nurse Program (Signed)
  Dept: [phone]   Congregational Nurse Program Note  Date of Encounter: 11/09/2023  Past Medical History: Past Medical History:  Diagnosis Date   Back pain    Hypertension    Prostate cancer [facility])     Encounter Details:  Community Questionnaire - 11/09/23 1306       Questionnaire   Ask client: Do you give verbal consent for me to treat you today? Yes    Student Assistance N/A    Location Patient Served  NAI    Encounter Setting CN site    Population Status Migrant/Refugee    Insurance Medicaid    Insurance/Financial Assistance Referral Charitable Care    Medication Have Medication Insecurities;Patient Medications Reviewed;Provided Medication Assistance;Referred to Medication Assistance    Medical Provider Yes    Screening Referrals Made N/A    Medical Referrals Made [facility]    Medical Appointment Completed N/A    CNP Interventions Advocate/Support;Navigate Healthcare System;Counsel;Case Management;Educate    Screenings CN Performed N/A    ED Visit Averted N/A    Life-Saving Intervention Made N/A          Patient now covered by medicaid but has a huge hospital bill prior to medicaid. Patient assisted by [facility] case manager to complete medicaid additional forms to see if medicaid date can be back dated. Transportation assistance provided to take the forms to DSS. Transportation assistance will also be provided for Dr`s appointment today.  Naomie Kenny Rea RN BSN PCCN  [facility] [phone]-cell [phone]-office

## 2023-11-09 NOTE — Patient Instructions (Addendum)
 Byari byiza cyane kukubona uyu munsi.  Nyamuneka uzane imiti yawe yose hamwe nawe igihe cyose usuye.   Uyu munsi twaganiriye:  GLENWOOD Snare urukingo rwa grippe - Irinde gukoresha Q-nama mumatwi yawe - Pearle buff Diclofenac  kububabare bwiburyo bwiburyo  Shellee yawe itaha izaba kuwa 12/14/23 saa 1:30 PM.  Urakoze guhitamo Ubuvuzi bwumuryango [facility].   Nyamuneka hamagara [phone] nibibazo byose bijyanye na gahunda yuyu munsi.  Nyamuneka uhageze byibuze iminota 15 mbere yuko cote d'ivoire.   Niba ufite amaraso uyumunsi, nzakoherereza ubutumwa bwa MyChart cyangwa ibaruwa niba ibisubizo ari ibisanzwe. Bitabaye ibyo, nzaguha umuhamagaro.   Niba ufite icyerekezo cyoherejwe, bazaguhamagara hobson paulina shellee. Nyamuneka uduhe guhamagara niba utongeye kumva mubyumweru 2 biri imbere.   Niba ukeneye kuzuzwa mbere yuko ubutaha, nyamuneka hamagara farumasi yawe.  Ntiwibagirwe weyman [place] ya Mose [facility] kuri [address] [phone] Ibiciro bihendutse kubitabo byandikirwa hamwe no kurenza ibicuruzwa, kimwe na serivisi nkinkingo no gutanga imiti murugo.   Nikkolas Coomes, DO Ubuzima bwa [facility] Ubuvuzi bwumuryango [place], PGY-1 ---------------------------------------------------------------------------------------------------------------------------------------------- It was wonderful to see you today.  Please bring ALL of your medications with you to every visit.   Today we talked about:  - You received your flu vaccine - Avoid using Q-tips in your ears - Continue using Diclofenac  for your right elbow pain  Your next appointment will be on 12/14/23 at 1:30PM.  Thank you for choosing [facility].   Please call [phone] with any questions about today's appointment.  Please arrive at least 15 minutes prior to your scheduled appointments.   If you had blood work today, I will send you a MyChart message or a  letter if results are normal. Otherwise, I will give you a call.   If you had a referral placed, they will call you to set up an appointment. Please give us  a call if you don't hear back in the next 2 weeks.   If you need additional refills before your next appointment, please call your pharmacy first.  Don't forget to check out the [facility] in the Heart & Vascular Center at [address] [phone] Affordable prices on prescriptions and over-the-counter items, as well as services like vaccinations and medication home delivery.   Reghan Thul, DO [facility] Family Medicine Resident, PGY-1

## 2023-11-10 ENCOUNTER — Encounter: Payer: Self-pay | Admitting: Hematology

## 2023-11-11 ENCOUNTER — Other Ambulatory Visit: Payer: Self-pay

## 2023-11-11 DIAGNOSIS — C61 Malignant neoplasm of prostate: Secondary | ICD-10-CM

## 2023-11-12 ENCOUNTER — Telehealth: Payer: Self-pay

## 2023-11-12 ENCOUNTER — Inpatient Hospital Stay: Payer: Self-pay

## 2023-11-12 NOTE — Telephone Encounter (Signed)
 error

## 2023-11-16 NOTE — Congregational Nurse Program (Signed)
  Dept: [phone]   Congregational Nurse Program Note  Date of Encounter: 11/16/2023  Past Medical History: Past Medical History:  Diagnosis Date   Back pain    Hypertension    Prostate cancer [facility])     Encounter Details:  Community Questionnaire - 11/16/23 1246       Questionnaire   Ask client: Do you give verbal consent for me to treat you today? Yes    Student Assistance N/A    Location Patient Served  NAI    Encounter Setting CN site    Population Status Migrant/Refugee    Insurance Medicaid    Insurance/Financial Assistance Referral Charitable Care    Medication Have Medication Insecurities;Patient Medications Reviewed;Provided Medication Assistance;Referred to Medication Assistance    Medical Provider Yes    Screening Referrals Made N/A    Medical Referrals Made [facility]    Medical Appointment Completed N/A    CNP Interventions Advocate/Support;Navigate Healthcare System;Counsel;Case Management;Educate    Screenings CN Performed N/A    ED Visit Averted N/A    Life-Saving Intervention Made N/A         Patient provided with transportation [facility].  Naomie Dilpreet Faires RN BSN PCCN  [facility] [phone]-cell [phone]-office

## 2023-11-25 ENCOUNTER — Encounter: Payer: Self-pay | Admitting: Licensed Clinical Social Worker

## 2023-11-26 ENCOUNTER — Inpatient Hospital Stay

## 2023-11-26 ENCOUNTER — Telehealth: Payer: Self-pay

## 2023-11-26 ENCOUNTER — Other Ambulatory Visit: Payer: Self-pay

## 2023-11-26 ENCOUNTER — Inpatient Hospital Stay: Attending: Hematology

## 2023-11-26 VITALS — BP 145/91 | HR 81 | Temp 97.1°F | Resp 16

## 2023-11-26 DIAGNOSIS — Z5111 Encounter for antineoplastic chemotherapy: Secondary | ICD-10-CM | POA: Diagnosis present

## 2023-11-26 DIAGNOSIS — C61 Malignant neoplasm of prostate: Secondary | ICD-10-CM | POA: Diagnosis not present

## 2023-11-26 DIAGNOSIS — C7951 Secondary malignant neoplasm of bone: Secondary | ICD-10-CM | POA: Diagnosis not present

## 2023-11-26 LAB — CMP (CANCER CENTER ONLY)
ALT: 7 U/L (ref 0–44)
AST: 17 U/L (ref 15–41)
Albumin: 3.8 g/dL (ref 3.5–5.0)
Alkaline Phosphatase: 51 U/L (ref 38–126)
Anion gap: 4 — ABNORMAL LOW (ref 5–15)
BUN: 12 mg/dL (ref 8–23)
CO2: 33 mmol/L — ABNORMAL HIGH (ref 22–32)
Calcium: 9 mg/dL (ref 8.9–10.3)
Chloride: 103 mmol/L (ref 98–111)
Creatinine: 0.73 mg/dL (ref 0.61–1.24)
GFR, Estimated: >60 mL/min (ref >60)
Glucose, Bld: 75 mg/dL (ref 70–99)
Potassium: 2.9 mmol/L — ABNORMAL LOW (ref 3.5–5.1)
Sodium: 140 mmol/L (ref 135–145)
Total Bilirubin: 0.4 mg/dL (ref 0.0–1.2)
Total Protein: 8.4 g/dL — ABNORMAL HIGH (ref 6.5–8.1)

## 2023-11-26 LAB — CBC WITH DIFFERENTIAL (CANCER CENTER ONLY)
Abs Immature Granulocytes: 0.02 K/uL (ref 0.00–0.07)
Basophils Absolute: 0 K/uL (ref 0.0–0.1)
Basophils Relative: 0 %
Eosinophils Absolute: 0.2 K/uL (ref 0.0–0.5)
Eosinophils Relative: 3 %
HCT: 42.2 % (ref 39.0–52.0)
Hemoglobin: 14.2 g/dL (ref 13.0–17.0)
Immature Granulocytes: 0 %
Lymphocytes Relative: 52 %
Lymphs Abs: 3.8 K/uL (ref 0.7–4.0)
MCH: 28.6 pg (ref 26.0–34.0)
MCHC: 33.6 g/dL (ref 30.0–36.0)
MCV: 85.1 fL (ref 80.0–100.0)
Monocytes Absolute: 0.4 K/uL (ref 0.1–1.0)
Monocytes Relative: 5 %
Neutro Abs: 2.9 K/uL (ref 1.7–7.7)
Neutrophils Relative %: 40 %
Platelet Count: 183 K/uL (ref 150–400)
RBC: 4.96 MIL/uL (ref 4.22–5.81)
RDW: 12.7 % (ref 11.5–15.5)
WBC Count: 7.3 K/uL (ref 4.0–10.5)
nRBC: 0 % (ref 0.0–0.2)

## 2023-11-26 MED ORDER — LEUPROLIDE ACETATE (3 MONTH) 22.5 MG ~~LOC~~ KIT
22.5000 mg | PACK | Freq: Once | SUBCUTANEOUS | Status: AC
  Administered 2023-11-26: 22.5 mg via SUBCUTANEOUS
  Filled 2023-11-26: qty 22.5

## 2023-11-26 MED ORDER — ZOLEDRONIC ACID 4 MG/100ML IV SOLN
4.0000 mg | Freq: Once | INTRAVENOUS | Status: AC
  Administered 2023-11-26: 4 mg via INTRAVENOUS
  Filled 2023-11-26: qty 100

## 2023-11-26 MED ORDER — POTASSIUM CHLORIDE CRYS ER 20 MEQ PO TBCR
40.0000 meq | EXTENDED_RELEASE_TABLET | Freq: Once | ORAL | Status: AC
  Administered 2023-11-26: 40 meq via ORAL
  Filled 2023-11-26: qty 2

## 2023-11-26 NOTE — Patient Instructions (Signed)
 [facility] - A DEPT OF [facility]  Discharge Instructions: Thank you for choosing [facility] to provide your oncology and hematology care.   If you have a lab appointment with the Cancer Center, please go directly to the Cancer Center and check in at the registration area.   Wear comfortable clothing and clothing appropriate for easy access to any Portacath or PICC line.   We strive to give you quality time with your provider. You may need to reschedule your appointment if you arrive late (15 or more minutes).  Arriving late affects you and other patients whose appointments are after yours.  Also, if you miss three or more appointments without notifying the office, you may be dismissed from the clinic at the provider's discretion.      For prescription refill requests, have your pharmacy contact our office and allow 72 hours for refills to be completed.    Today you received the following chemotherapy and/or immunotherapy agents: Zometa , Potassium, Leuprolide      To help prevent nausea and vomiting after your treatment, we encourage you to take your nausea medication as directed.  BELOW ARE SYMPTOMS THAT SHOULD BE REPORTED IMMEDIATELY: *FEVER GREATER THAN 100.4 F (38 C) OR HIGHER *CHILLS OR SWEATING *NAUSEA AND VOMITING THAT IS NOT CONTROLLED WITH YOUR NAUSEA MEDICATION *UNUSUAL SHORTNESS OF BREATH *UNUSUAL BRUISING OR BLEEDING *URINARY PROBLEMS (pain or burning when urinating, or frequent urination) *BOWEL PROBLEMS (unusual diarrhea, constipation, pain near the anus) TENDERNESS IN MOUTH AND THROAT WITH OR WITHOUT PRESENCE OF ULCERS (sore throat, sores in mouth, or a toothache) UNUSUAL RASH, SWELLING OR PAIN  UNUSUAL VAGINAL DISCHARGE OR ITCHING   Items with * indicate a potential emergency and should be followed up as soon as possible or go to the Emergency Department if any problems should occur.  Please show the CHEMOTHERAPY ALERT  CARD or IMMUNOTHERAPY ALERT CARD at check-in to the Emergency Department and triage nurse.  Should you have questions after your visit or need to cancel or reschedule your appointment, please contact [facility] - A DEPT OF [facility]  Dept: [phone]  and follow the prompts.  Office hours are 8:00 a.m. to 4:30 p.m. Monday - Friday. Please note that voicemails left after 4:00 p.m. may not be returned until the following business day.  We are closed weekends and major holidays. You have access to a nurse at all times for urgent questions. Please call the main number to the clinic Dept: [phone] and follow the prompts.   For any non-urgent questions, you may also contact your provider using MyChart. We now offer e-Visits for anyone 25 and older to request care online for non-urgent symptoms. For details visit [email or web address].   Also download the MyChart app! Go to the app store, search MyChart, open the app, select [facility], and log in with your MyChart username and password.

## 2023-11-26 NOTE — Telephone Encounter (Signed)
Patient called with appointment reminder.Transportation assistance will be provided.  Nicole Cella Kemper Heupel RN BSN PCCN  [facility] [phone]-cell [phone]-office

## 2023-11-27 LAB — PSA, TOTAL AND FREE
PSA, Free Pct: 50 %
PSA, Free: 0.3 ng/mL
Prostate Specific Ag, Serum: 0.6 ng/mL (ref 0.0–4.0)

## 2023-12-07 NOTE — Congregational Nurse Program (Signed)
   Dept: [phone]   Congregational Nurse Program Note  Date of Encounter: 12/07/2023  Past Medical History: Past Medical History:  Diagnosis Date   Back pain    Hypertension    Prostate cancer [facility])     Encounter Details:  Community Questionnaire - 12/07/23 1052       Questionnaire   Ask client: Do you give verbal consent for me to treat you today? Yes    Student Assistance [employer]   Ezra Person, EMT   Location Patient Served  [facility]I    Encounter Setting CN site    Population Status Migrant/Refugee    Insurance Medicaid    Insurance/Financial Assistance Referral N/A    Medication Have Medication Insecurities;Patient Medications Reviewed;Provided Medication Assistance;Referred to Medication Assistance    Medical Provider Yes    Screening Referrals Made N/A    Medical Referrals Made [facility]    Medical Appointment Completed N/A    CNP Interventions Advocate/Support;Navigate Healthcare System;Counsel;Case Management;Educate    Screenings CN Performed N/A    ED Visit Averted N/A    Life-Saving Intervention Made N/A         Patient came in for blood pressure check. He reports to be running low on some medications and will bring his pill bottles tomorrow (12/08/2023) for assistance with medication refill.   Naomie Johnesha Acheampong RN BSN PCCN  [facility] [phone]-cell [phone]-office

## 2023-12-08 ENCOUNTER — Telehealth: Payer: Self-pay

## 2023-12-08 ENCOUNTER — Other Ambulatory Visit (HOSPITAL_COMMUNITY): Payer: Self-pay

## 2023-12-08 ENCOUNTER — Other Ambulatory Visit: Payer: Self-pay

## 2023-12-08 NOTE — Telephone Encounter (Signed)
 Patient came in requesting medication refill. We contacted [employer] at [facility]. Medication will need provider's authorization and it will be delivered to patient's home when ready.  Naomie Lillyen Schow RN BSN PCCN  [facility] [phone]-cell [phone]-office

## 2023-12-10 ENCOUNTER — Ambulatory Visit

## 2023-12-10 ENCOUNTER — Other Ambulatory Visit

## 2023-12-10 ENCOUNTER — Other Ambulatory Visit (HOSPITAL_COMMUNITY): Payer: Self-pay

## 2023-12-10 ENCOUNTER — Other Ambulatory Visit: Payer: Self-pay | Admitting: *Deleted

## 2023-12-10 ENCOUNTER — Other Ambulatory Visit: Payer: Self-pay

## 2023-12-10 DIAGNOSIS — G893 Neoplasm related pain (acute) (chronic): Secondary | ICD-10-CM

## 2023-12-10 MED ORDER — OXYCODONE HCL 5 MG PO TABS
5.0000 mg | ORAL_TABLET | Freq: Three times a day (TID) | ORAL | 0 refills | Status: DC | PRN
  Filled 2023-12-10: qty 60, 20d supply, fill #0

## 2023-12-14 ENCOUNTER — Ambulatory Visit

## 2023-12-14 NOTE — Congregational Nurse Program (Signed)
  Dept: [phone]   Congregational Nurse Program Note  Date of Encounter: 12/14/2023  Past Medical History: Past Medical History:  Diagnosis Date   Back pain    Hypertension    Prostate cancer [facility])     Encounter Details:  Community Questionnaire - 12/14/23 1012       Questionnaire   Ask client: Do you give verbal consent for me to treat you today? Yes    Student Assistance CSWEI   Ezra Person, EMT   Location Patient Served  NAI    Encounter Setting CN site    Population Status Migrant/Refugee    Insurance Medicaid    Insurance/Financial Assistance Referral N/A    Medication Have Medication Insecurities    Medical Provider Yes    Screening Referrals Made N/A    Medical Referrals Made N/A    Medical Appointment Completed N/A    CNP Interventions Advocate/Support;Navigate Healthcare System;Counsel;Case Management;Educate    Screenings CN Performed Blood Pressure    ED Visit Averted N/A    Life-Saving Intervention Made N/A         Patient came in requesting appointment to be cancelled for today. 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 Congregational RN is unable to schedule ride without a car seat.   Also patient lost all his documents in his wallet including green card, social security card, bank card and food stamp card. Case managers assisted with locking the bank card and food stamp card. Case manager is assisting with replacement.   Patient has completed application for financial assistance due to unpaid medical bills that he accrued before receiving his medicaid.   Naomie Diego Ulbricht RN BSN PCCN  [facility] [phone]-cell [phone]-office

## 2023-12-23 ENCOUNTER — Other Ambulatory Visit: Payer: Self-pay

## 2023-12-23 DIAGNOSIS — C61 Malignant neoplasm of prostate: Secondary | ICD-10-CM

## 2023-12-24 ENCOUNTER — Encounter: Payer: Self-pay | Admitting: Licensed Clinical Social Worker

## 2023-12-24 ENCOUNTER — Telehealth: Payer: Self-pay

## 2023-12-24 ENCOUNTER — Inpatient Hospital Stay: Attending: Hematology

## 2023-12-24 ENCOUNTER — Inpatient Hospital Stay

## 2023-12-24 VITALS — BP 142/91 | HR 79 | Temp 97.9°F | Resp 16

## 2023-12-24 DIAGNOSIS — C7951 Secondary malignant neoplasm of bone: Secondary | ICD-10-CM | POA: Insufficient documentation

## 2023-12-24 DIAGNOSIS — C61 Malignant neoplasm of prostate: Secondary | ICD-10-CM

## 2023-12-24 LAB — CMP (CANCER CENTER ONLY)
ALT: 6 U/L (ref 0–44)
AST: 18 U/L (ref 15–41)
Albumin: 4 g/dL (ref 3.5–5.0)
Alkaline Phosphatase: 52 U/L (ref 38–126)
Anion gap: 6 (ref 5–15)
BUN: 13 mg/dL (ref 8–23)
CO2: 32 mmol/L (ref 22–32)
Calcium: 9.6 mg/dL (ref 8.9–10.3)
Chloride: 100 mmol/L (ref 98–111)
Creatinine: 0.8 mg/dL (ref 0.61–1.24)
GFR, Estimated: >60 mL/min (ref >60)
Glucose, Bld: 97 mg/dL (ref 70–99)
Potassium: 3.3 mmol/L — ABNORMAL LOW (ref 3.5–5.1)
Sodium: 138 mmol/L (ref 135–145)
Total Bilirubin: 0.4 mg/dL (ref 0.0–1.2)
Total Protein: 8.6 g/dL — ABNORMAL HIGH (ref 6.5–8.1)

## 2023-12-24 LAB — CBC WITH DIFFERENTIAL (CANCER CENTER ONLY)
Abs Immature Granulocytes: 0.03 K/uL (ref 0.00–0.07)
Basophils Absolute: 0 K/uL (ref 0.0–0.1)
Basophils Relative: 1 %
Eosinophils Absolute: 0.2 K/uL (ref 0.0–0.5)
Eosinophils Relative: 2 %
HCT: 42.8 % (ref 39.0–52.0)
Hemoglobin: 14.4 g/dL (ref 13.0–17.0)
Immature Granulocytes: 0 %
Lymphocytes Relative: 52 %
Lymphs Abs: 4.4 K/uL — ABNORMAL HIGH (ref 0.7–4.0)
MCH: 28.6 pg (ref 26.0–34.0)
MCHC: 33.6 g/dL (ref 30.0–36.0)
MCV: 84.9 fL (ref 80.0–100.0)
Monocytes Absolute: 0.4 K/uL (ref 0.1–1.0)
Monocytes Relative: 5 %
Neutro Abs: 3.3 K/uL (ref 1.7–7.7)
Neutrophils Relative %: 40 %
Platelet Count: 180 K/uL (ref 150–400)
RBC: 5.04 MIL/uL (ref 4.22–5.81)
RDW: 12.6 % (ref 11.5–15.5)
WBC Count: 8.4 K/uL (ref 4.0–10.5)
nRBC: 0 % (ref 0.0–0.2)

## 2023-12-24 MED ORDER — ZOLEDRONIC ACID 4 MG/100ML IV SOLN
4.0000 mg | Freq: Once | INTRAVENOUS | Status: AC
  Administered 2023-12-24: 4 mg via INTRAVENOUS
  Filled 2023-12-24: qty 100

## 2023-12-24 NOTE — Patient Instructions (Signed)

## 2023-12-24 NOTE — Telephone Encounter (Signed)
 I have called patient for appointment reminder. Transportation assistance will be provided.  Naomie Kailoni Vahle RN BSN PCCN  [facility] [phone]-cell [phone]-office

## 2023-12-25 LAB — PSA, TOTAL AND FREE
PSA, Free Pct: 43.3 %
PSA, Free: 0.26 ng/mL
Prostate Specific Ag, Serum: 0.6 ng/mL (ref 0.0–4.0)

## 2023-12-28 ENCOUNTER — Encounter: Payer: Self-pay | Admitting: Family Medicine

## 2023-12-28 ENCOUNTER — Ambulatory Visit (INDEPENDENT_AMBULATORY_CARE_PROVIDER_SITE_OTHER): Admitting: Family Medicine

## 2023-12-28 VITALS — BP 125/90 | HR 104 | Ht 69.0 in | Wt 122.0 lb

## 2023-12-28 DIAGNOSIS — J449 Chronic obstructive pulmonary disease, unspecified: Secondary | ICD-10-CM | POA: Diagnosis not present

## 2023-12-28 DIAGNOSIS — C61 Malignant neoplasm of prostate: Secondary | ICD-10-CM

## 2023-12-28 DIAGNOSIS — C7951 Secondary malignant neoplasm of bone: Secondary | ICD-10-CM

## 2023-12-28 DIAGNOSIS — Z23 Encounter for immunization: Secondary | ICD-10-CM | POA: Diagnosis present

## 2023-12-28 DIAGNOSIS — Z59819 Housing instability, housed unspecified: Secondary | ICD-10-CM | POA: Diagnosis not present

## 2023-12-28 DIAGNOSIS — Z86711 Personal history of pulmonary embolism: Secondary | ICD-10-CM | POA: Insufficient documentation

## 2023-12-28 DIAGNOSIS — J42 Unspecified chronic bronchitis: Secondary | ICD-10-CM

## 2023-12-28 DIAGNOSIS — I10 Essential (primary) hypertension: Secondary | ICD-10-CM | POA: Diagnosis not present

## 2023-12-28 MED ORDER — BUDESONIDE-FORMOTEROL FUMARATE 160-4.5 MCG/ACT IN AERO
2.0000 | INHALATION_SPRAY | Freq: Two times a day (BID) | RESPIRATORY_TRACT | 2 refills | Status: DC
Start: 2023-12-28 — End: 2024-01-27

## 2023-12-28 MED ORDER — AMLODIPINE BESYLATE 10 MG PO TABS: 10.0000 mg | ORAL_TABLET | Freq: Every day | ORAL | 1 refills | Status: DC

## 2023-12-28 MED ORDER — APIXABAN 5 MG PO TABS: 5.0000 mg | ORAL_TABLET | Freq: Two times a day (BID) | ORAL | 3 refills | Status: DC

## 2023-12-28 NOTE — Progress Notes (Signed)
 Pt given his monthly supply of Xtandi . Pt's medication is delivered to [facility]. Pt aware via interpreter to take 2 tablets daily.

## 2023-12-28 NOTE — Patient Instructions (Addendum)
 It was wonderful to see you today.  Please bring ALL of your medications with you to every visit.   Today we talked about:  - We scheduled you a follow up on Friday December 19th at 11:00 am with Dr Delores  Thank you for choosing [facility].   Please call [phone] with any questions about today's appointment.  Please arrive at least 15 minutes prior to your scheduled appointments.   If you had blood work today, I will send you a MyChart message or a letter if results are normal. Otherwise, I will give you a call.   If you had a referral placed, they will call you to set up an appointment. Please give us  a call if you don't hear back in the next 2 weeks.   If you need additional refills before your next appointment, please call your pharmacy first.  Don't forget to check out the [facility] in the Heart & Vascular Center at [address] [phone] Affordable prices on prescriptions and over-the-counter items, as well as services like vaccinations and medication home delivery.   Rollene Keeling, MD  Family Medicine

## 2023-12-28 NOTE — Assessment & Plan Note (Signed)
-   Continue Eliquis

## 2023-12-28 NOTE — Congregational Nurse Program (Addendum)
  Dept: [phone]   Congregational Nurse Program Note  Date of Encounter: 12/28/2023  Past Medical History: Past Medical History:  Diagnosis Date   Back pain    Hypertension    Prostate cancer [facility])     Encounter Details:  Community Questionnaire - 12/28/23 1035       Questionnaire   Ask client: Do you give verbal consent for me to treat you today? Yes    Student Assistance CSWEI   Ezra Person, EMT   Location Patient Served  NAI    Encounter Setting CN site    Population Status Migrant/Refugee    Insurance Medicaid    Insurance/Financial Assistance Referral [facility] Financial Assistance    Medication Have Medication Insecurities;Provided Medication Assistance    Medical Provider Yes    Screening Referrals Made N/A    Medical Referrals Made N/A    Medical Appointment Completed [facility] P[facility]inic    CNP Interventions Advocate/Support;Navigate Healthcare System;Counsel;Case Management;Educate    Screenings CN Performed Blood Pressure    ED Visit Averted N/A    Life-Saving Intervention Made N/A         Patient came in for blood pressure check and also requesting assistance to pick up medication at the [facility]. Transportation assistance provided.  Noted patient has another appointment today at [facility]. Transportation assistance scheduled. Patient made aware. Requested to bring all his medications to this appointment.   Naomie Annaliz Aven RN BSN PCCN  [facility] [phone]-cell [phone]-office

## 2023-12-28 NOTE — Assessment & Plan Note (Signed)
Continue amlodipine 10 mg daily.

## 2023-12-28 NOTE — Assessment & Plan Note (Signed)
 Working with NAI

## 2023-12-28 NOTE — Assessment & Plan Note (Addendum)
 Follows with oncology Dr Onesimo, on Xtandi  160mg  PO daily, Zometa  4mg  every month, and Eligard  q3months PRN oxycodone  as needed for pain, only using 4x a week, denies constipation

## 2023-12-28 NOTE — Progress Notes (Signed)
    SUBJECTIVE:   CHIEF COMPLAINT / HPI:   Htn- taking his amlodipine . NO side effects. Needs a refill.  COPD- using Symbicort  BID, feels breathing is good. Denies chest pain or shortness of breath. No cough. Quit smoking years ago.   Metastatic prostate cancer- feeling well, no side effects. Uses oxycodone  about 4x a week, no constipation. Taking Eliquis , no bleeding.   Still unsure who is paying his rent. Working with Naomie to find other housing options.   PERTINENT  PMH / PSH: metastatic prostate cancer on Xtandi  daily, HTN, bilateral low back pain, history of PE on Eliquis , COPD  OBJECTIVE:   BP (!) 147/86   Pulse (!) 104   Ht 5' 9 (1.753 m)   Wt 122 lb (55.3 kg)   SpO2 95%   BMI 18.02 kg/m   Physical Exam General: A&O, NAD HEENT: No sign of trauma, EOM grossly intact Cardiac: RRR, no m/r/g Respiratory: CTAB, normal WOB, no w/c/r GI: Soft, NTTP, non-distended  Extremities: NTTP, no peripheral edema. Neuro: Normal gait, moves all four extremities appropriately. 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[facility]) Follows with oncology Dr Onesimo, on Xtandi  160mg  PO daily, Zometa  4mg  every month, and Eligard  q61months PRN oxycodone  as needed for pain, only using 4x a week, denies constipation Essential hypertension Continue amlodipine  10mg  daily Chronic obstructive pulmonary disease, unspecified COPD type (HCC) Continue Symbicort  2 puffs twice daily, albuterol  PRN History of pulmonary embolism Continue Eliquis  Chronic bronchitis, unspecified chronic bronchitis type (HCC) Continue Symbicort  2 puffs twice daily, albuterol  PRN Housing insecurity Working with NAI    Rollene FORBES Keeling, MD [facility] Center

## 2023-12-28 NOTE — Assessment & Plan Note (Signed)
 Continue Symbicort  2 puffs twice daily, albuterol  PRN

## 2024-01-05 ENCOUNTER — Other Ambulatory Visit: Payer: Self-pay

## 2024-01-05 ENCOUNTER — Other Ambulatory Visit (HOSPITAL_COMMUNITY): Payer: Self-pay

## 2024-01-05 ENCOUNTER — Other Ambulatory Visit: Payer: Self-pay | Admitting: Student

## 2024-01-05 DIAGNOSIS — I10 Essential (primary) hypertension: Secondary | ICD-10-CM

## 2024-01-05 MED ORDER — AMLODIPINE BESYLATE 10 MG PO TABS
10.0000 mg | ORAL_TABLET | Freq: Every day | ORAL | 1 refills | Status: AC
  Filled 2024-01-05 – 2024-01-12 (×2): qty 90, 90d supply, fill #0

## 2024-01-05 NOTE — Congregational Nurse Program (Signed)
  Dept: [phone]   Congregational Nurse Program Note  Date of Encounter: 01/05/2024  Past Medical History: Past Medical History:  Diagnosis Date   Back pain    Hypertension    Prostate cancer [facility])     Encounter Details:  Community Questionnaire - 01/05/24 1157       Questionnaire   Ask client: Do you give verbal consent for me to treat you today? Yes    Student Assistance CSWEI   Ezra Person, EMT   Location Patient Served  NAI    Encounter Setting CN site    Population Status Migrant/Refugee    Insurance Medicaid    Insurance/Financial Assistance Referral [facility] Financial Assistance    Medication Have Medication Insecurities;Provided Medication Assistance    Medical Provider Yes    Screening Referrals Made N/A    Medical Referrals Made N/A    Medical Appointment Completed [facility]Clinic    CNP Interventions Advocate/Support;Navigate Healthcare System;Counsel;Case Management;Educate    Screenings CN Performed Blood Pressure    ED Visit Averted N/A    Life-Saving Intervention Made N/A         Patient brought his medication in for inspection, he needs refills on Eliquis  and Amlodipine . I have contacted pharmacy for refills.   Naomie Tymira Horkey RN BSN PCCN  [facility] [phone]-cell [phone]-office

## 2024-01-06 ENCOUNTER — Other Ambulatory Visit (HOSPITAL_COMMUNITY): Payer: Self-pay

## 2024-01-07 ENCOUNTER — Ambulatory Visit

## 2024-01-07 ENCOUNTER — Other Ambulatory Visit

## 2024-01-07 ENCOUNTER — Other Ambulatory Visit (HOSPITAL_COMMUNITY): Payer: Self-pay

## 2024-01-07 ENCOUNTER — Encounter: Payer: Self-pay | Admitting: Hematology

## 2024-01-12 ENCOUNTER — Telehealth: Payer: Self-pay

## 2024-01-12 ENCOUNTER — Telehealth: Payer: Self-pay | Admitting: Family Medicine

## 2024-01-12 ENCOUNTER — Other Ambulatory Visit: Payer: Self-pay

## 2024-01-12 ENCOUNTER — Other Ambulatory Visit (HOSPITAL_COMMUNITY): Payer: Self-pay

## 2024-01-12 MED ORDER — APIXABAN 5 MG PO TABS
5.0000 mg | ORAL_TABLET | Freq: Two times a day (BID) | ORAL | 3 refills | Status: AC
  Filled 2024-01-12: qty 180, 90d supply, fill #0

## 2024-01-12 NOTE — Telephone Encounter (Signed)
 Refilled Eliquis .  Suzann Daring, MD  Family Medicine Teaching Service

## 2024-01-12 NOTE — Telephone Encounter (Signed)
 Oral Oncology Patient Advocate Encounter  Re-enrollment completed  for XTANDI  in an effort to reduce patient's out of pocket expense to $0.  [id number]  Application has been approved pending insurance verification in January 2026. He does currently have [place] Medicaid, they do not need a PA but will need a cost exceeds max override when doing fill. If he still has Medicaid in January [facility] will need to fill.  [employer] patient assistance phone number for follow up is [phone].   Lucie Lamer, CPhT   [facility] Oncology Pharmacy Patient Advocate Specialist II TH[place] Phone: [phone]  Fax: [phone] [email or web address]

## 2024-01-12 NOTE — Congregational Nurse Program (Signed)
  Dept: [phone]   Congregational Nurse Program Note  Date of Encounter: 01/12/2024  Past Medical History: Past Medical History:  Diagnosis Date   Back pain    Hypertension    Prostate cancer [facility])     Encounter Details:  Community Questionnaire - 01/12/24 1100       Questionnaire   Ask client: Do you give verbal consent for me to treat you today? Yes    Student Assistance [employer]   Ezra Person, EMT   Location Patient Served  [place]    Encounter Setting CN site    Population Status Migrant/Refugee    Insurance Medicaid    Insurance/Financial Assistance Referral [facility]    Medication Have Medication Insecurities;Provided Medication Assistance;Patient Medications Reviewed    Medical Provider Yes    Screening Referrals Made N/A    Medical Referrals Made N/A    Medical Appointment Completed [facility] PCP[facility]Clinic    CNP Interventions Advocate/Support;Navigate Healthcare System;Counsel;Case Management;Educate    Screenings CN Performed Blood Pressure    ED Visit Averted N/A    Life-Saving Intervention Made N/A          Patient has not received Amlodipine  and Eliquis  yet, I have reached out to [facility]. Eliquis  needs a refill by provider. Staff messages sent to Dr Delores. Refill done.  Naomie Kemiya Batdorf RN BSN PCCN  [facility] [phone]-cell [phone]-office

## 2024-01-19 ENCOUNTER — Other Ambulatory Visit: Payer: Self-pay

## 2024-01-19 ENCOUNTER — Telehealth: Payer: Self-pay

## 2024-01-19 DIAGNOSIS — C61 Malignant neoplasm of prostate: Secondary | ICD-10-CM

## 2024-01-19 NOTE — Telephone Encounter (Signed)
 Patient called with appointment reminder. Transportation assistance will be provided.  Naomie Majesti Gambrell RN BSN PCCN  [facility] [phone]-cell [phone]-office

## 2024-01-21 ENCOUNTER — Inpatient Hospital Stay: Attending: Hematology

## 2024-01-21 ENCOUNTER — Encounter: Payer: Self-pay | Admitting: Licensed Clinical Social Worker

## 2024-01-21 ENCOUNTER — Inpatient Hospital Stay

## 2024-01-21 VITALS — BP 143/88 | HR 80 | Temp 98.1°F | Resp 16 | Ht 69.0 in | Wt 128.8 lb

## 2024-01-21 DIAGNOSIS — C61 Malignant neoplasm of prostate: Secondary | ICD-10-CM | POA: Diagnosis not present

## 2024-01-21 DIAGNOSIS — C7951 Secondary malignant neoplasm of bone: Secondary | ICD-10-CM

## 2024-01-21 LAB — CMP (CANCER CENTER ONLY)
ALT: 8 U/L (ref 0–44)
AST: 18 U/L (ref 15–41)
Albumin: 3.7 g/dL (ref 3.5–5.0)
Alkaline Phosphatase: 48 U/L (ref 38–126)
Anion gap: 5 (ref 5–15)
BUN: 7 mg/dL — ABNORMAL LOW (ref 8–23)
CO2: 31 mmol/L (ref 22–32)
Calcium: 9 mg/dL (ref 8.9–10.3)
Chloride: 103 mmol/L (ref 98–111)
Creatinine: 0.71 mg/dL (ref 0.61–1.24)
GFR, Estimated: >60 mL/min (ref >60)
Glucose, Bld: 90 mg/dL (ref 70–99)
Potassium: 3.6 mmol/L (ref 3.5–5.1)
Sodium: 139 mmol/L (ref 135–145)
Total Bilirubin: 0.5 mg/dL (ref 0.0–1.2)
Total Protein: 7.8 g/dL (ref 6.5–8.1)

## 2024-01-21 LAB — CBC WITH DIFFERENTIAL (CANCER CENTER ONLY)
Abs Immature Granulocytes: 0.02 K/uL (ref 0.00–0.07)
Basophils Absolute: 0 K/uL (ref 0.0–0.1)
Basophils Relative: 1 %
Eosinophils Absolute: 0.1 K/uL (ref 0.0–0.5)
Eosinophils Relative: 3 %
HCT: 42 % (ref 39.0–52.0)
Hemoglobin: 14.1 g/dL (ref 13.0–17.0)
Immature Granulocytes: 0 %
Lymphocytes Relative: 46 %
Lymphs Abs: 2.5 K/uL (ref 0.7–4.0)
MCH: 29 pg (ref 26.0–34.0)
MCHC: 33.6 g/dL (ref 30.0–36.0)
MCV: 86.4 fL (ref 80.0–100.0)
Monocytes Absolute: 0.3 K/uL (ref 0.1–1.0)
Monocytes Relative: 5 %
Neutro Abs: 2.4 K/uL (ref 1.7–7.7)
Neutrophils Relative %: 45 %
Platelet Count: 175 K/uL (ref 150–400)
RBC: 4.86 MIL/uL (ref 4.22–5.81)
RDW: 13 % (ref 11.5–15.5)
WBC Count: 5.3 K/uL (ref 4.0–10.5)
nRBC: 0 % (ref 0.0–0.2)

## 2024-01-21 MED ORDER — SODIUM CHLORIDE 0.9 % IV SOLN: Freq: Once | INTRAVENOUS | Status: AC

## 2024-01-21 MED ORDER — ZOLEDRONIC ACID 4 MG/100ML IV SOLN
4.0000 mg | Freq: Once | INTRAVENOUS | Status: AC
  Administered 2024-01-21: 4 mg via INTRAVENOUS
  Filled 2024-01-21: qty 100

## 2024-01-21 NOTE — Patient Instructions (Signed)

## 2024-01-21 NOTE — Progress Notes (Signed)
 Patient denies any upcoming dental procedures or dental pain at this time.

## 2024-01-22 LAB — PSA, TOTAL AND FREE
PSA, Free Pct: 50 %
PSA, Free: 0.2 ng/mL
Prostate Specific Ag, Serum: 0.4 ng/mL (ref 0.0–4.0)

## 2024-01-24 ENCOUNTER — Other Ambulatory Visit

## 2024-01-24 ENCOUNTER — Ambulatory Visit: Admitting: Hematology

## 2024-01-26 NOTE — Congregational Nurse Program (Signed)
  Dept: [phone]   Congregational Nurse Program Note  Date of Encounter: 01/26/2024  Past Medical History: Past Medical History:  Diagnosis Date   Back pain    Hypertension    Prostate cancer [facility])     Encounter Details:  Community Questionnaire - 01/26/24 1245       Questionnaire   Ask client: Do you give verbal consent for me to treat you today? Yes    Student Assistance [employer]   Ezra Person, EMT   Location Patient Served  [facility]    Encounter Setting CN site    Population Status Migrant/Refugee    Insurance Medicaid    Insurance/Financial Assistance Referral [facility]    Medication Have Medication Insecurities;Provided Medication Assistance;Patient Medications Reviewed    Medical Provider Yes    Screening Referrals Made N/A    Medical Referrals Made N/A    Medical Appointment Completed [facility] P[facility]    CNP Interventions Advocate/Support;Navigate Healthcare System;Counsel;Case Management;Educate    Screenings CN Performed Blood Pressure    ED Visit Averted N/A    Life-Saving Intervention Made N/A         Patient came in requesting mediation refill for oxycodone  and Symbicort . I will call pharmacy for refills, medication will be delivered to his home.   Naomie Tarus Briski RN BSN PCCN  [facility] [phone]-cell [phone]-office

## 2024-01-27 ENCOUNTER — Other Ambulatory Visit (HOSPITAL_COMMUNITY): Payer: Self-pay

## 2024-01-27 ENCOUNTER — Other Ambulatory Visit: Payer: Self-pay | Admitting: Family Medicine

## 2024-01-27 DIAGNOSIS — G893 Neoplasm related pain (acute) (chronic): Secondary | ICD-10-CM

## 2024-01-27 DIAGNOSIS — J42 Unspecified chronic bronchitis: Secondary | ICD-10-CM

## 2024-01-27 MED ORDER — BUDESONIDE-FORMOTEROL FUMARATE 160-4.5 MCG/ACT IN AERO
2.0000 | INHALATION_SPRAY | Freq: Two times a day (BID) | RESPIRATORY_TRACT | 2 refills | Status: DC
  Filled 2024-01-27: qty 10.2, 30d supply, fill #0

## 2024-01-27 NOTE — Telephone Encounter (Signed)
 Only uses oxycodone  4X per week per notes. Will refilled at upcoming visit. Refilled Symbicort .   Suzann Daring, MD  Family Medicine Teaching Service

## 2024-02-04 ENCOUNTER — Other Ambulatory Visit

## 2024-02-04 ENCOUNTER — Ambulatory Visit

## 2024-02-04 ENCOUNTER — Ambulatory Visit: Admitting: Physician Assistant

## 2024-02-04 ENCOUNTER — Other Ambulatory Visit (HOSPITAL_COMMUNITY): Payer: Self-pay

## 2024-02-09 ENCOUNTER — Other Ambulatory Visit (HOSPITAL_COMMUNITY): Payer: Self-pay

## 2024-02-09 ENCOUNTER — Ambulatory Visit: Admitting: Family Medicine

## 2024-02-09 ENCOUNTER — Encounter: Payer: Self-pay | Admitting: Family Medicine

## 2024-02-09 VITALS — BP 131/82 | HR 90 | Ht 69.0 in | Wt 126.6 lb

## 2024-02-09 DIAGNOSIS — F419 Anxiety disorder, unspecified: Secondary | ICD-10-CM

## 2024-02-09 DIAGNOSIS — C61 Malignant neoplasm of prostate: Secondary | ICD-10-CM

## 2024-02-09 DIAGNOSIS — G893 Neoplasm related pain (acute) (chronic): Secondary | ICD-10-CM

## 2024-02-09 DIAGNOSIS — J42 Unspecified chronic bronchitis: Secondary | ICD-10-CM

## 2024-02-09 DIAGNOSIS — C7951 Secondary malignant neoplasm of bone: Secondary | ICD-10-CM | POA: Diagnosis not present

## 2024-02-09 MED ORDER — ALBUTEROL SULFATE HFA 108 (90 BASE) MCG/ACT IN AERS
2.0000 | INHALATION_SPRAY | RESPIRATORY_TRACT | 0 refills | Status: DC | PRN
  Filled 2024-02-09: qty 6.7, 16d supply, fill #0

## 2024-02-09 MED ORDER — BUDESONIDE-FORMOTEROL FUMARATE 160-4.5 MCG/ACT IN AERO
2.0000 | INHALATION_SPRAY | Freq: Two times a day (BID) | RESPIRATORY_TRACT | 2 refills | Status: AC
  Filled 2024-02-09: qty 10.2, 30d supply, fill #0

## 2024-02-09 MED ORDER — OXYCODONE HCL 5 MG PO TABS
5.0000 mg | ORAL_TABLET | Freq: Three times a day (TID) | ORAL | 0 refills | Status: DC | PRN
  Filled 2024-02-09 – 2024-02-18 (×2): qty 60, 20d supply, fill #0

## 2024-02-09 MED ORDER — SERTRALINE HCL 50 MG PO TABS
50.0000 mg | ORAL_TABLET | Freq: Every day | ORAL | 3 refills | Status: DC
  Filled 2024-02-09 – 2024-02-18 (×2): qty 30, 30d supply, fill #0
  Filled 2024-03-30: qty 30, 30d supply, fill #1

## 2024-02-09 NOTE — Progress Notes (Unsigned)
    SUBJECTIVE:   CHIEF COMPLAINT / HPI:   Prostate cancer, mets to bone   PERTINENT  PMH / PSH: ***  OBJECTIVE:   There were no vitals taken for this visit.  ***  ASSESSMENT/PLAN:   Assessment & Plan      Damien Cassis, MD [facility]

## 2024-02-09 NOTE — Congregational Nurse Program (Signed)
  Dept: [phone]   Congregational Nurse Program Note  Date of Encounter: 02/09/2024  Past Medical History: Past Medical History:  Diagnosis Date   Back pain    Hypertension    Prostate cancer [facility])     Encounter Details:  Community Questionnaire - 02/09/24 1031       Questionnaire   Ask client: Do you give verbal consent for me to treat you today? Yes    Student Assistance [employer]   Ezra Person, EMT   Location Patient Served  [facility]    Encounter Setting CN site    Population Status Migrant/Refugee    Insurance Medicaid    Insurance/Financial Assistance Referral [facility] Financial Assistance    Medication Have Medication Insecurities;Provided Medication Assistance;Patient Medications Reviewed    Medical Provider Yes    Screening Referrals Made N/A    Medical Referrals Made N/A    Medical Appointment Completed [facility]    CNP Interventions Advocate/Support;Navigate Healthcare System;Counsel;Case Management;Educate    Screenings CN Performed Blood Pressure    ED Visit Averted N/A    Life-Saving Intervention Made N/A        Patient is complaining of pain left elbow and shoulder 7/10. Unable to sleep at night. He is out of oxycodone . He reports to be taking this medication 3 times in a day. Educated to use this medication as needed. Provider unable to refill without seeing patient. Appointment scheduled to a sick visit today. Transportation assistance provided.  Naomie Marilu Rylander RN BSN PCCN  [facility] [phone] [phone]-office

## 2024-02-09 NOTE — Patient Instructions (Addendum)
 Urakoze gusura ivuriro uyumunsi kandi ukatwemerera kugira uruhare mukwitaho!  Twujuje imiti yawe y'ububabare. [place] fata inshuro 3 kumunsi nkuko bikenewe kubabara.   Twatangiye imiti mishya yitwa Sertraline kugirango igufashe kumutima wawe [employer] insurance. Nzaguhamagara icyumweru gitaha kugirango urebe kuriyi.   [place] koresha Symbicort  2 puffs kabiri kumunsi. Urashobora gukoresha albuterol  2 puffs buri masaha 4 nkuko bikenewe.   Shikira igihe icyo ari cyo cyose ufite ibibazo cyangwa ibibazo ushobora kuba ufite - turi hano [place]!  Damien Cassis, MD [facility] [phone] ________  Thank you for visiting clinic today and allowing us  to participate in your care!  We refilled your pain medications. Please take up to 3 times a day as needed for pain.   We started a new medication called Sertraline to help with your anxious mood. I will call you next week to check in on this.   Please use Symbicort  2 puffs twice a day every day. You can use albuterol  2 puffs every 4 hours as needed.   Reach out any time with any questions or concerns you may have - we are here for you!  Damien Cassis, MD [facility] [phone]

## 2024-02-10 NOTE — Assessment & Plan Note (Addendum)
 Cancer related pain poorly controlled without sufficient oxycodone  supply. No constipation.  - Refilled oxycodone  5 q8 PRN - Continue cancer treatment per Oncology

## 2024-02-11 ENCOUNTER — Other Ambulatory Visit (HOSPITAL_COMMUNITY): Payer: Self-pay

## 2024-02-11 ENCOUNTER — Other Ambulatory Visit: Payer: Self-pay

## 2024-02-14 ENCOUNTER — Other Ambulatory Visit: Payer: Self-pay

## 2024-02-14 ENCOUNTER — Other Ambulatory Visit (HOSPITAL_COMMUNITY): Payer: Self-pay

## 2024-02-15 ENCOUNTER — Telehealth: Payer: Self-pay

## 2024-02-15 ENCOUNTER — Telehealth: Payer: Self-pay | Admitting: Family Medicine

## 2024-02-15 ENCOUNTER — Other Ambulatory Visit (HOSPITAL_COMMUNITY): Payer: Self-pay

## 2024-02-15 NOTE — Telephone Encounter (Signed)
 Called patient with Kinyarwanda interpretive services to check in on mood. Patient states he has not received new medications yet. Will reach out to congregational RN regarding delivery of meds. Informed patient of upcoming [facility] appointment.   Future Appointments  Date Time Provider Department Center  02/18/2024 10:00 AM [facility] LAB [facility] None  02/18/2024 10:30 AM Onesimo Emaline Brink, MD [facility] None  02/18/2024 11:30 AM [facility] INFUSION [facility] None  02/25/2024 11:10 AM Delores Suzann HERO, MD [facility]

## 2024-02-15 NOTE — Telephone Encounter (Signed)
 Informed by provider that patient has not received medication from Pharmacy. I have contacted [facility]. Informed that payment method failed. Contacted patient and updated payment card information. Medication will be out for delivery today.  Naomie Khiley Lieser RN BSN PCCN  [facility] [phone]-cell [phone]-office

## 2024-02-17 ENCOUNTER — Other Ambulatory Visit: Payer: Self-pay

## 2024-02-17 DIAGNOSIS — C61 Malignant neoplasm of prostate: Secondary | ICD-10-CM

## 2024-02-18 ENCOUNTER — Other Ambulatory Visit: Attending: Hematology

## 2024-02-18 ENCOUNTER — Other Ambulatory Visit (HOSPITAL_COMMUNITY): Payer: Self-pay

## 2024-02-18 ENCOUNTER — Telehealth: Payer: Self-pay

## 2024-02-18 ENCOUNTER — Ambulatory Visit: Admitting: Hematology

## 2024-02-18 ENCOUNTER — Ambulatory Visit

## 2024-02-18 ENCOUNTER — Other Ambulatory Visit (HOSPITAL_BASED_OUTPATIENT_CLINIC_OR_DEPARTMENT_OTHER): Payer: Self-pay

## 2024-02-18 VITALS — BP 134/94 | HR 95 | Temp 96.8°F | Resp 17 | Ht 69.0 in | Wt 129.0 lb

## 2024-02-18 DIAGNOSIS — G893 Neoplasm related pain (acute) (chronic): Secondary | ICD-10-CM | POA: Diagnosis not present

## 2024-02-18 DIAGNOSIS — Z87891 Personal history of nicotine dependence: Secondary | ICD-10-CM | POA: Diagnosis not present

## 2024-02-18 DIAGNOSIS — I272 Pulmonary hypertension, unspecified: Secondary | ICD-10-CM | POA: Insufficient documentation

## 2024-02-18 DIAGNOSIS — C7951 Secondary malignant neoplasm of bone: Secondary | ICD-10-CM | POA: Diagnosis not present

## 2024-02-18 DIAGNOSIS — D72820 Lymphocytosis (symptomatic): Secondary | ICD-10-CM | POA: Insufficient documentation

## 2024-02-18 DIAGNOSIS — Z86718 Personal history of other venous thrombosis and embolism: Secondary | ICD-10-CM | POA: Diagnosis not present

## 2024-02-18 DIAGNOSIS — E43 Unspecified severe protein-calorie malnutrition: Secondary | ICD-10-CM | POA: Diagnosis not present

## 2024-02-18 DIAGNOSIS — C61 Malignant neoplasm of prostate: Secondary | ICD-10-CM | POA: Insufficient documentation

## 2024-02-18 LAB — CBC WITH DIFFERENTIAL (CANCER CENTER ONLY)
Abs Immature Granulocytes: 0.01 K/uL (ref 0.00–0.07)
Basophils Absolute: 0 K/uL (ref 0.0–0.1)
Basophils Relative: 0 %
Eosinophils Absolute: 0.2 K/uL (ref 0.0–0.5)
Eosinophils Relative: 3 %
HCT: 45.6 % (ref 39.0–52.0)
Hemoglobin: 15.5 g/dL (ref 13.0–17.0)
Immature Granulocytes: 0 %
Lymphocytes Relative: 57 %
Lymphs Abs: 4 K/uL (ref 0.7–4.0)
MCH: 28.9 pg (ref 26.0–34.0)
MCHC: 34 g/dL (ref 30.0–36.0)
MCV: 85.1 fL (ref 80.0–100.0)
Monocytes Absolute: 0.3 K/uL (ref 0.1–1.0)
Monocytes Relative: 5 %
Neutro Abs: 2.5 K/uL (ref 1.7–7.7)
Neutrophils Relative %: 35 %
Platelet Count: 178 K/uL (ref 150–400)
RBC: 5.36 MIL/uL (ref 4.22–5.81)
RDW: 12.8 % (ref 11.5–15.5)
WBC Count: 7.1 K/uL (ref 4.0–10.5)
nRBC: 0 % (ref 0.0–0.2)

## 2024-02-18 LAB — CMP (CANCER CENTER ONLY)
ALT: 12 U/L (ref 0–44)
AST: 26 U/L (ref 15–41)
Albumin: 4.2 g/dL (ref 3.5–5.0)
Alkaline Phosphatase: 63 U/L (ref 38–126)
Anion gap: 9 (ref 5–15)
BUN: 12 mg/dL (ref 8–23)
CO2: 30 mmol/L (ref 22–32)
Calcium: 9.5 mg/dL (ref 8.9–10.3)
Chloride: 105 mmol/L (ref 98–111)
Creatinine: 0.79 mg/dL (ref 0.61–1.24)
GFR, Estimated: >60 mL/min (ref >60)
Glucose, Bld: 87 mg/dL (ref 70–99)
Potassium: 3.4 mmol/L — ABNORMAL LOW (ref 3.5–5.1)
Sodium: 144 mmol/L (ref 135–145)
Total Bilirubin: 0.6 mg/dL (ref 0.0–1.2)
Total Protein: 8.8 g/dL — ABNORMAL HIGH (ref 6.5–8.1)

## 2024-02-18 LAB — PSA: Prostatic Specific Antigen: 0.36 ng/mL (ref 0.00–4.00)

## 2024-02-18 MED ORDER — LEUPROLIDE ACETATE (3 MONTH) 22.5 MG ~~LOC~~ KIT
22.5000 mg | PACK | Freq: Once | SUBCUTANEOUS | Status: AC
  Administered 2024-02-18: 22.5 mg via SUBCUTANEOUS
  Filled 2024-02-18: qty 22.5

## 2024-02-18 MED ORDER — ZOLEDRONIC ACID 4 MG/100ML IV SOLN
4.0000 mg | Freq: Once | INTRAVENOUS | Status: AC
  Administered 2024-02-18: 4 mg via INTRAVENOUS
  Filled 2024-02-18: qty 100

## 2024-02-18 MED ORDER — SODIUM CHLORIDE 0.9 % IV SOLN: Freq: Once | INTRAVENOUS | Status: AC

## 2024-02-18 NOTE — Progress Notes (Signed)
 HEMATOLOGY ONCOLOGY PROGRESS NOTE  Date of service: 02/18/2024  Patient Care Team: Delores Suzann HERO, MD as PCP - General (Family Medicine) Vertell Pont, RN as Oncology Nurse Navigator Onesimo, Emaline Brink, MD as Consulting Physician (Hematology)  CHIEF COMPLAINT/PURPOSE OF CONSULTATION: Follow-up for continued evaluation and management of metastatic prostate cancer.  HISTORY OF PRESENTING ILLNESS: Arthur Ali is a wonderful 63 y.o. male who is being seen for evaluation and management of likely metastatic prostate cancer based on elevated PSA levels.     Patient was previously seen by me in 2021 for lymphocytosis thought to be a reactive process. At that time, he had no signs of monoclonal lymphocytes. He was also vitamin B12 deficient at that time and his pseudothrombocytopenia had resolved.    Patient presented to the hospital with worsening dyspnea chest pain night sweats loss of appetite and unintentional weight loss and will directly admitted from the family medicine clinic.  CT of the chest done on 11/19/2022 showed no evidence of pulmonary embolism but did show pathologic thoracic adenopathy within the right paratracheal and subcarinal lymph node groups as well as widespread sclerotic metastatic disease throughout the visualized axial skeleton.  He was noted to have a 6 mm noncalcified pulmonary nodule in the right upper lobe.  Also noted to have mild central pulmonary artery enlargement with suggestions of elevated right heart pressure. He also had a x-ray of the lumbar spine which showed degenerative change of the lumbar spine and diffuse mottled appearance of the femoral heads and pelvis which could be concerning for metastatic disease.   Patient had a PSA test which showed significant elevation to nearly 1500 being highly suggestive of metastatic prostate cancer. TB QuantiFERON test is currently pending.   Patient CBC today shows normal WBC count of 10.2k with anemia with a  hemoglobin of 10.5 and a platelet count of 174k. CMP shows hypocalcemia with calcium of 7.9, decreased albumin of 1.8 and alkaline phosphatase of 500. Normal transaminases and bilirubin.  Creatinine is within normal limits at 0.76.   HIV test is nonreactive TSH was within normal limits at 1.85 Multiple myeloma panel and K/L FLC are currently pending.   Patient was seen in airborne isolation with the help of video Kinyarwanda interpreter Octavio).  He notes that he has lost 10 to 15 kg of body weight with significantly decreased p.o. intake.  Notes decreased urinary flow about a week ago but is a little better now.  No overt hematuria.  Notes some issues with constipation. Has had diffuse bodyaches especially over the spine and pelvis.   Also notes some shortness of breath. Chest wall pain noted. Notes no new upper or lower extremity weakness, no loss of bowel or bladder control.   SUMMARY OF ONCOLOGIC HISTORY: Oncology History   No problem history exists.    INTERVAL HISTORY: Arthur Ali is a 63 y.o. male who is here today for continued evaluation and management of metastatic prostate cancer. He is accompanied by a translator today.   he was last seen by me on 10/15/2023; at the time he did not have any concerns and was doing well.   Today, he has been experiencing pain in his right shoulder and the rt side of his lower back. His medication has not been filled in three weeks. He has not been taking his pain medication for the past three weeks. He is not taking any additional medication to manage his pain.  He is tolerating his other medication well (Xtandi , Eliquis ).  He denies smoking. Denies any dental issues as well.   Denies any abdominal pain, new lumps/bumps, leg swelling, diarrhea, or bowel/urinary changes.  REVIEW OF SYSTEMS:   10 Point review of systems of done and is negative except as noted above.  MEDICAL HISTORY Past Medical History:  Diagnosis Date   Back  pain    Hypertension    Prostate cancer (HCC)     SURGICAL HISTORY No past surgical history on file.  SOCIAL HISTORY Social History[1]  Social History   Social History Narrative   Single applicant from [place]   No family here or in [place]       Refugee Information   Number of Immediate Family Members: 0   Number of Immediate Family Members in [place] : 0   Country of Birth: [facility] [place] [facility]   Country of Origin: [place] [facility]   Location of Refugee Camp: [place]   Duration in [place]: 20 years or greater   Reason for Leaving Home Country: Political opinion   Primary Language: Swahili/Kiswahili, Kinyar[place]anda/[place]   Able to Read in Primary Language: Yes   Able to Write in Primary Language: Yes   Education: Primary School   Prior Work: No previous jobs   Marital Status: Other (Wife and children decreased)   Sexual Activity: No   Tuberculosis Screening Overseas: Positive   Tuberculosis Screening Health Department: Not Completed   Health Department Labs Completed: Yes   History of Trauma: None   Do You Feel Jumpy or Nervous?: No   Are You Very Watchful or 'Super Alert'?: No    SOCIAL DRIVERS OF HEALTH SDOH Screenings   Food Insecurity: No Food Insecurity (11/03/2023)  Recent Concern: Food Insecurity - Food Insecurity Present (08/23/2023)  Housing: High Risk (11/16/2023)  Transportation Needs: No Transportation Needs (02/09/2024)  Recent Concern: Transportation Needs - Unmet Transportation Needs (11/16/2023)  Utilities: At Risk (06/26/2023)  Depression (PHQ2-9): Low Risk (01/21/2024)  Financial Resource Strain: High Risk (05/05/2023)  Social Connections: Unknown (06/26/2023)  Stress: Stress Concern Present (05/04/2023)  Tobacco Use: Medium Risk (02/09/2024)  Health Literacy: Inadequate Health Literacy (02/09/2023)     FAMILY HISTORY Family History  Family history unknown: Yes     ALLERGIES: has no known allergies.  MEDICATIONS  Current Outpatient Medications  Medication Sig Dispense Refill   albuterol   (VENTOLIN  HFA) 108 (90 Base) MCG/ACT inhaler Inhale 2 puffs into the lungs every 4 (four) hours as needed for wheezing or shortness of breath. 6.7 g 0   amLODipine  (NORVASC ) 10 MG tablet Take 1 tablet (10 mg total) by mouth at bedtime. 90 tablet 1   apixaban  (ELIQUIS ) 5 MG TABS tablet Take 1 tablet (5 mg total) by mouth 2 (two) times daily. 180 tablet 3   budesonide -formoterol  (SYMBICORT ) 160-4.5 MCG/ACT inhaler Inhale 2 puffs into the lungs 2 (two) times daily. 10.2 g 2   diclofenac  Sodium (VOLTAREN ) 1 % GEL Apply 4 g topically 4 (four) times daily. To affected joint. 100 g 11   enzalutamide  (XTANDI ) 80 MG tablet Take 2 tablets (160 mg total) by mouth daily. 60 tablet 11   oxyCODONE  (OXY IR/ROXICODONE ) 5 MG immediate release tablet Take 1 tablet (5 mg total) by mouth every 8 (eight) hours as needed for severe pain (pain score 7-10). 60 tablet 0   polyethylene glycol powder (GLYCOLAX /MIRALAX ) 17 GM/SCOOP powder Take 17 g by mouth 2 (two) times daily as needed. 3350 g 1   sertraline  (ZOLOFT ) 50 MG tablet Take 1 tablet (50 mg total) by mouth daily. 30 tablet 3  No current facility-administered medications for this visit.    PHYSICAL EXAMINATION: ECOG PERFORMANCE STATUS: 1 - Symptomatic but completely ambulatory VITALS: Vitals:   02/18/24 1025 02/18/24 1035  BP: (!) 140/88 (!) 134/94  Pulse: 95   Resp: 17   Temp: (!) 96.8 F (36 C)   SpO2: 92%    Filed Weights   02/18/24 1025  Weight: 129 lb (58.5 kg)   Wt Readings from Last 3 Encounters:  02/18/24 129 lb (58.5 kg)  02/09/24 126 lb 9.6 oz (57.4 kg)  01/21/24 128 lb 12.8 oz (58.4 kg)     Body mass index is 19.05 kg/m.  GENERAL: alert, in no acute distress and comfortable SKIN: no acute rashes, no significant lesions EYES: conjunctiva are pink and non-injected, sclera anicteric OROPHARYNX: MMM, no exudates, no oropharyngeal erythema or ulceration NECK: supple, no JVD LYMPH:  no palpable lymphadenopathy in the cervical, axillary  or inguinal regions LUNGS: clear to auscultation b/l with normal respiratory effort HEART: regular rate & rhythm ABDOMEN:  normoactive bowel sounds , non tender, not distended, no hepatosplenomegaly Extremity: no pedal edema PSYCH: alert & oriented x 3 with fluent speech NEURO: no focal motor/sensory deficits  LABORATORY DATA:   I have reviewed the data as listed     Latest Ref Rng & Units 02/18/2024   10:18 AM 01/21/2024    1:44 PM 12/24/2023    1:37 PM  CBC EXTENDED  WBC 4.0 - 10.5 K/uL 7.1  5.3  8.4   RBC 4.22 - 5.81 MIL/uL 5.36  4.86  5.04   Hemoglobin 13.0 - 17.0 g/dL 84.4  85.8  85.5   HCT 39.0 - 52.0 % 45.6  42.0  42.8   Platelets 150 - 400 K/uL 178  175  180   NEUT# 1.7 - 7.7 K/uL 2.5  2.4  3.3   Lymph# 0.7 - 4.0 K/uL 4.0  2.5  4.4        Latest Ref Rng & Units 02/18/2024   10:18 AM 01/21/2024    1:44 PM 12/24/2023    1:37 PM  CMP  Glucose 70 - 99 mg/dL 87  90  97   BUN 8 - 23 mg/dL 12  7  13    Creatinine 0.61 - 1.24 mg/dL 9.20  9.28  9.19   Sodium 135 - 145 mmol/L 144  139  138   Potassium 3.5 - 5.1 mmol/L 3.4  3.6  3.3   Chloride 98 - 111 mmol/L 105  103  100   CO2 22 - 32 mmol/L 30  31  32   Calcium 8.9 - 10.3 mg/dL 9.5  9.0  9.6   Total Protein 6.5 - 8.1 g/dL 8.8  7.8  8.6   Total Bilirubin 0.0 - 1.2 mg/dL 0.6  0.5  0.4   Alkaline Phos 38 - 126 U/L 63  48  52   AST 15 - 41 U/L 26  18  18    ALT 0 - 44 U/L 12  8  6     Prostate Specific Ag, Serum 0.7 0.9 CM QNSREP R, CM 1.6 CM 2.2 CM 5.0 High  CM 5.3 High  CM    Component     Latest Ref Rng 11/20/2022 11/21/2022  Prostatic Specific Antigen     0.00 - 4.00 ng/mL 1,444.22 (H)     Vitamin D , 25-Hydroxy     30 - 100 ng/mL   33.22     Legend: (H) High   SURGICAL PATHOLOGY  CASE: [phone]  PATIENT: LONZIE ALPERS  Surgical Pathology Report    Clinical History: sclerotic changes/infiltration, anticipated prostate  mets (cm)    FINAL MICROSCOPIC DIAGNOSIS:   A. BONE, RIGHT POSTERIOR  ILIAC, BIOPSY:  -  Metastatic carcinoma, consistent with prostate origin.   Note: The biopsy of bone with extensive bone marrow fibrosis/sclerosis.  Embedded in the sclerotic stroma are extremely crushed cells without  identifiable morphology; however, the clinical impression of metastatic  prostate carcinoma is noted and confirmatory immunohistochemical stains  highlight these crushed cells of interest as positive for NKX3.1  consistent with metastatic carcinoma of prostate primary.  Dr. Frutoso is  peer-reviewed the case and agrees with the interpretation.    RADIOGRAPHIC STUDIES: I have personally reviewed the radiological images as listed and agreed with the findings in the report. No results found.  ASSESSMENT & PLAN:  63 y.o. male  from [place] who primarily speaks Kinyarwanda (very little English) presenting with shortness of breath, failure to thrive and generalized body pains   1,Newly diagnosed Metastatic prostate cancer with extensive skeletal lesions and thoracic adenopathy. Stage 5.  PSA level nearly 1500.   2.  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   NO evidence of monoclonal paraproteinemia to suggestive primary bone marrow disorder.   3.  Mild lymphocytosis lymphocyte count of 5.2k previously noted to be nonclonal.  Likely from chronic inflammation/reactive.  Previously has been as high as 9.2k   4.  Findings of pulmonary hypertension on CTA--echocardiogram  with normal EF 60-65%, grade 1 diastolic dysfunction   5.  Pulmonary embolism -- possible-- on ELiquis    6.  Severe protein calorie malnutrition with weight loss of 30 to 40 pounds due to his metastatic prostate cancer.   7. Cancer related pain    PLAN: - Discussed lab results on 02/18/2024 in detail with patient: -PSA level was normal at 0.4 -continue regiment of Xtandi  80mg  tablet, Zometa  once a month, and hormone shot every 3 months  -Recommends tylenol  OTC for pain  management -will write a prescription for oxycodone   -Continue monthly zometa  Continue every 3 monthly Eligard  RTC with Dr Onesimo with labs in 3 months  FOLLOW-UP in 3 months for labs and follow-up with Dr. Onesimo.  The total time spent in the appointment was *** minutes* .  All of the patient's questions were answered and the patient knows to call the clinic with any problems, questions, or concerns.  Emaline Onesimo MD MS AAHIVMS [facility] Hematology/Oncology Physician [facility]  *Total Encounter Time as defined by the Centers for Medicare and Medicaid Services includes, in addition to the face-to-face time of a patient visit (documented in the note above) non-face-to-face time: obtaining and reviewing outside history, ordering and reviewing medications, tests or procedures, care coordination (communications with other health care professionals or caregivers) and documentation in the medical record.  I, Alan Blowers, acting as a neurosurgeon for Emaline Onesimo, MD.,have documented all relevant documentation on the behalf of Emaline Onesimo, MD,as directed by  Emaline Onesimo, MD while in the presence of Emaline Onesimo, MD.  I have reviewed the above documentation for accuracy and completeness, and I agree with the above.      [1]  Social History Tobacco Use   Smoking status: Former    Types: Cigars, Cigarettes   Smokeless tobacco: Never   Tobacco comments:    Quit in 2022.   Vaping Use   Vaping status: Never Used  Substance Use Topics   Alcohol use: Never  Drug use: Never

## 2024-02-18 NOTE — Telephone Encounter (Signed)
 I have contacted pharmacy because has not received his oxycodone  and Zoloft . Pharmacy stated that delivery was attempted but no one was home and medication was returned to pharmacy. I have scheduled a ride for patient to pick up the medication himself. He is experiencing cancer related pain and reports to be very uncomfortable especially at night.  Naomie Herley Bernardini RN BSN PCCN  [facility] [phone]-cell [phone]-office

## 2024-02-18 NOTE — Telephone Encounter (Signed)
 Patient called with appointment reminder. Transportation assistance provided.  Naomie Hessie Varone RN BSN PCCN  [facility] [phone]-cell [phone]-office

## 2024-02-22 NOTE — Congregational Nurse Program (Signed)
°  Dept: [phone]   Congregational Nurse Program Note  Date of Encounter: 02/22/2024  Past Medical History: Past Medical History:  Diagnosis Date   Back pain    Hypertension    Prostate cancer [facility])     Encounter Details:  Community Questionnaire - 02/22/24 1216       Questionnaire   Ask client: Do you give verbal consent for me to treat you today? Yes    Student Assistance Medical Student   Ezra Person, EMT   Location Patient Served  [facility]    Encounter Setting CN site    Population Status Migrant/Refugee    Insurance Medicaid    Insurance/Financial Assistance Referral N/A    Medication Have Medication Insecurities;Patient Medications Reviewed    Medical Provider Yes    Screening Referrals Made N/A    Medical Referrals Made N/A    Medical Appointment Completed [facility]inic    CNP Interventions Advocate/Support;Navigate Healthcare System;Counsel;Case Management;Educate    Screenings CN Performed N/A    ED Visit Averted N/A    Life-Saving Intervention Made N/A         Patient brought in Zoloft  medication requesting assistance to understand administration. Patient educated to take once a day as ordered. All questions answered.   Naomie Stephanieann Popescu RN BSN PCCN  [facility] [phone]-cell [phone]-office

## 2024-02-24 ENCOUNTER — Telehealth: Payer: Self-pay

## 2024-02-24 NOTE — Progress Notes (Unsigned)
° ° °  SUBJECTIVE:   CHIEF COMPLAINT: mood, pain HPI:   Damire Moser is a 63 y.o.  with history notable for prostate cancer, HTN, prior PE, and depressoin presenting for follow up  The patient speaks [place] as their primary language.  An interpreter was used for the entire visit.  SABRA   Discussed the use of AI scribe software for clinical note transcription with the patient, who gave verbal consent to proceed.  History of Present Illness      PERTINENT  PMH / PSH/Family/Social History : Prostate cancer with bony metastasis   OBJECTIVE:   There were no vitals taken for this visit.  Today's weight:  Review of prior weights: There were no vitals filed for this visit.   Cardiac: Regular rate and rhythm. Normal S1/S2. No murmurs, rubs, or gallops appreciated. Lungs: Clear bilaterally to ascultation.  Abdomen: Normoactive bowel sounds. No tenderness to deep or light palpation. No rebound or guarding.  ***  Psych: Pleasant and appropriate    ASSESSMENT/PLAN:   Assessment & Plan Chronic obstructive pulmonary disease, unspecified COPD type (HCC)  Essential hypertension  History of pulmonary embolism     Suzann Daring, MD  Family Medicine Teaching Service  [facility]

## 2024-02-24 NOTE — Telephone Encounter (Signed)
 I have called patient with appointment reminder. Transportation assistance will be provided.  Naomie Pascuala Klutts RN BSN PCCN  [facility] [phone]-cell [phone]-office

## 2024-02-25 ENCOUNTER — Other Ambulatory Visit: Payer: Self-pay

## 2024-02-25 ENCOUNTER — Ambulatory Visit: Admitting: Family Medicine

## 2024-02-25 ENCOUNTER — Encounter: Payer: Self-pay | Admitting: Family Medicine

## 2024-02-25 ENCOUNTER — Encounter: Payer: Self-pay | Admitting: Hematology

## 2024-02-25 VITALS — BP 138/88 | HR 96 | Ht 69.0 in | Wt 129.0 lb

## 2024-02-25 DIAGNOSIS — J449 Chronic obstructive pulmonary disease, unspecified: Secondary | ICD-10-CM | POA: Diagnosis present

## 2024-02-25 DIAGNOSIS — Z86711 Personal history of pulmonary embolism: Secondary | ICD-10-CM

## 2024-02-25 DIAGNOSIS — F321 Major depressive disorder, single episode, moderate: Secondary | ICD-10-CM | POA: Diagnosis not present

## 2024-02-25 DIAGNOSIS — I10 Essential (primary) hypertension: Secondary | ICD-10-CM

## 2024-02-25 MED ORDER — UMECLIDINIUM-VILANTEROL 62.5-25 MCG/ACT IN AEPB
1.0000 | INHALATION_SPRAY | Freq: Every day | RESPIRATORY_TRACT | 3 refills | Status: DC
  Filled 2024-02-25: qty 60, 30d supply, fill #0

## 2024-02-25 NOTE — Assessment & Plan Note (Signed)
 Continue apixaban , had recent CBC that was appropriate

## 2024-02-25 NOTE — Assessment & Plan Note (Signed)
 Continue amlodipine  at goal  Lipids today

## 2024-02-25 NOTE — Patient Instructions (Addendum)
 It was wonderful to see you today.  Please bring ALL of your medications with you to every visit.   Today we talked about:  We will check blood work today  I sent in a new inhaler I will reach out about the apartment issue   Please follow up in 1 months   Thank you for choosing [facility].   Please call [phone] with any questions about today's appointment.  Please be sure to schedule follow up at the front  desk before you leave today.   Suzann Daring, MD  Family Medicine    Byari byiza cyane bing uyu munsi.  Nyamuneka uzane imiti yawe yose hamwe nawe igihe cyose usuye.   Uyu munsi twaganiriye:  Uyu munsi tuzagenzura imikorere yamaraso  Nohereje muri inhaler nshya [place] kubyerekeye ikibazo cyamazu   Nyamuneka kurikira mumezi 1   Urakoze guhitamo Ubuvuzi [employer].   Nyamuneka hamagara [phone] nibibazo byose bijyanye na gahunda yuyu munsi.  Nyamuneka wemeze guteganya gukurikira kumeza mbere yuko ugenda uyumunsi.   Suzann Daring, MD  [place] bwumuryango

## 2024-02-25 NOTE — Assessment & Plan Note (Signed)
 Anoro Ellipta started  Doing well

## 2024-02-26 LAB — LIPID PANEL
Chol/HDL Ratio: 2.3 ratio (ref 0.0–5.0)
Cholesterol, Total: 179 mg/dL (ref 100–199)
HDL: 78 mg/dL
LDL Chol Calc (NIH): 80 mg/dL (ref 0–99)
Triglycerides: 119 mg/dL (ref 0–149)
VLDL Cholesterol Cal: 21 mg/dL (ref 5–40)

## 2024-02-28 ENCOUNTER — Ambulatory Visit: Payer: Self-pay | Admitting: Family Medicine

## 2024-02-29 ENCOUNTER — Encounter: Payer: Self-pay | Admitting: Hematology

## 2024-02-29 ENCOUNTER — Other Ambulatory Visit: Payer: Self-pay | Admitting: Physician Assistant

## 2024-03-01 ENCOUNTER — Encounter: Payer: Self-pay | Admitting: Hematology

## 2024-03-07 ENCOUNTER — Other Ambulatory Visit: Payer: Self-pay | Admitting: Physician Assistant

## 2024-03-08 ENCOUNTER — Encounter: Payer: Self-pay | Admitting: Licensed Clinical Social Worker

## 2024-03-08 NOTE — Congregational Nurse Program (Signed)
" °  Dept: [phone]   Congregational Nurse Program Note  Date of Encounter: 03/08/2024  Past Medical History: Past Medical History:  Diagnosis Date   Back pain    Hypertension    Prostate cancer [facility])     Encounter Details:  Community Questionnaire - 03/08/24 1143       Questionnaire   Ask client: Do you give verbal consent for me to treat you today? Yes    Student Assistance Medical Student   Ezra Person, EMT   [facility] S[facility]  [facility]    Encounter Setting Phone/Text/Email    Insurance Medicaid    Insurance/Financial Assistance Referral N/A    Medication Have Medication Insecurities;Patient Medications Reviewed    Medical Provider Yes    Medical Referrals Made [facility]    Medical Appointment Completed [facility]    Screenings CN Performed (remember to also record results) NA    ED Visit Averted N/A      Questionnaire   Life-Saving Intervention Made N/A          Patient needing to pick up Xtandi  from [facility].Transportation assistance provided.  Naomie Zahara Rembert RN BSN PCCN  [facility] [phone]-cell [phone]-office   "

## 2024-03-10 ENCOUNTER — Encounter: Payer: Self-pay | Admitting: Hematology

## 2024-03-14 ENCOUNTER — Telehealth: Payer: Self-pay

## 2024-03-14 ENCOUNTER — Encounter: Payer: Self-pay | Admitting: Licensed Clinical Social Worker

## 2024-03-14 ENCOUNTER — Inpatient Hospital Stay

## 2024-03-14 ENCOUNTER — Other Ambulatory Visit (HOSPITAL_COMMUNITY): Payer: Self-pay

## 2024-03-14 NOTE — Telephone Encounter (Signed)
 Patient assisted to make a call to [facility] long pharmacy for bill payment. Card left on file for future pharmacy bill payments.  Naomie Janaisha Tolsma RN BSN PCCN  [facility] [phone] [phone]-office

## 2024-03-20 NOTE — Telephone Encounter (Signed)
 Oral Oncology Patient Advocate Encounter   Received notification that the application for continued assistance for Xtandi  through Xtandi  Support has been denied due to patient now has [place] Medicaid.   [employer] patient assistance phone number for follow up is [phone].     Lucie Lamer, CPhT [place]  [facility] Oncology Pharmacy Patient Advocate Specialist II THERESSA [place] Phone: [phone]  Fax: [phone] [email or web address]

## 2024-03-21 NOTE — Congregational Nurse Program (Signed)
" °  Dept: [phone]   Congregational Nurse Program Note  Date of Encounter: 03/21/2024  Past Medical History: Past Medical History:  Diagnosis Date   Back pain    Hypertension    Prostate cancer [facility])     Encounter Details:  Community Questionnaire - 03/21/24 1703       Questionnaire   Ask client: Do you give verbal consent for me to treat you today? Yes    Student Assistance N/A    Location [facility] Served  [facility]    Encounter Setting CN site    Population Status Migrant farmer/Refugee/Immigrant    Insurance Medicaid    Insurance/Financial Assistance Referral N/A    Medication Have Medication Insecurities    Medical Provider Yes    Medical Referrals Made [facility]    Medical Appointment Completed [facility]    Screenings CN Performed (remember to also record results) NA    CNP Interventions Navigate Healthcare System;Health Counseling;Case Management;Advocate/Support    ED Visit Averted N/A      Questionnaire   Life-Saving Intervention Made N/A         [employer] lease agreement was declined by the leasing office because he has a debt of $4000. Patient referred back to Legal Aid for assistance. He has no eviction notice, a previous one was dismissed. He is in line waiting for low income housing meanwhile he has been advised by case managers to look for a roommate due to his limited finances.  Today, I have assisted him to pay for  water bill and hospital copay $8 using his credit card.  Naomie Mena Lienau RN BSN PCCN  [facility] [phone]-cell [phone]-office   "

## 2024-03-23 ENCOUNTER — Other Ambulatory Visit: Payer: Self-pay

## 2024-03-23 DIAGNOSIS — C61 Malignant neoplasm of prostate: Secondary | ICD-10-CM

## 2024-03-23 MED ORDER — ENZALUTAMIDE 80 MG PO TABS
160.0000 mg | ORAL_TABLET | Freq: Every day | ORAL | 11 refills | Status: AC
  Filled 2024-04-05: qty 60, 30d supply, fill #0

## 2024-03-24 ENCOUNTER — Inpatient Hospital Stay: Attending: Hematology

## 2024-03-24 ENCOUNTER — Inpatient Hospital Stay

## 2024-03-24 ENCOUNTER — Other Ambulatory Visit (HOSPITAL_COMMUNITY): Payer: Self-pay

## 2024-03-24 ENCOUNTER — Telehealth: Payer: Self-pay

## 2024-03-24 ENCOUNTER — Encounter: Payer: Self-pay | Admitting: Hematology

## 2024-03-24 ENCOUNTER — Other Ambulatory Visit: Payer: Self-pay

## 2024-03-24 VITALS — BP 152/99 | HR 66 | Temp 98.3°F | Resp 16 | Wt 128.5 lb

## 2024-03-24 DIAGNOSIS — C7951 Secondary malignant neoplasm of bone: Secondary | ICD-10-CM | POA: Diagnosis not present

## 2024-03-24 DIAGNOSIS — C61 Malignant neoplasm of prostate: Secondary | ICD-10-CM | POA: Diagnosis present

## 2024-03-24 LAB — CBC WITH DIFFERENTIAL (CANCER CENTER ONLY)
Abs Immature Granulocytes: 0.01 K/uL (ref 0.00–0.07)
Basophils Absolute: 0 K/uL (ref 0.0–0.1)
Basophils Relative: 1 %
Eosinophils Absolute: 0.2 K/uL (ref 0.0–0.5)
Eosinophils Relative: 2 %
HCT: 43.8 % (ref 39.0–52.0)
Hemoglobin: 15 g/dL (ref 13.0–17.0)
Immature Granulocytes: 0 %
Lymphocytes Relative: 55 %
Lymphs Abs: 3.8 K/uL (ref 0.7–4.0)
MCH: 29.1 pg (ref 26.0–34.0)
MCHC: 34.2 g/dL (ref 30.0–36.0)
MCV: 84.9 fL (ref 80.0–100.0)
Monocytes Absolute: 0.3 K/uL (ref 0.1–1.0)
Monocytes Relative: 4 %
Neutro Abs: 2.6 K/uL (ref 1.7–7.7)
Neutrophils Relative %: 38 %
Platelet Count: 176 K/uL (ref 150–400)
RBC: 5.16 MIL/uL (ref 4.22–5.81)
RDW: 12.7 % (ref 11.5–15.5)
WBC Count: 6.9 K/uL (ref 4.0–10.5)
nRBC: 0 % (ref 0.0–0.2)

## 2024-03-24 LAB — CMP (CANCER CENTER ONLY)
ALT: 9 U/L (ref 0–44)
AST: 24 U/L (ref 15–41)
Albumin: 4.3 g/dL (ref 3.5–5.0)
Alkaline Phosphatase: 63 U/L (ref 38–126)
Anion gap: 11 (ref 5–15)
BUN: 9 mg/dL (ref 8–23)
CO2: 28 mmol/L (ref 22–32)
Calcium: 9.1 mg/dL (ref 8.9–10.3)
Chloride: 100 mmol/L (ref 98–111)
Creatinine: 0.84 mg/dL (ref 0.61–1.24)
GFR, Estimated: >60 mL/min
Glucose, Bld: 80 mg/dL (ref 70–99)
Potassium: 3 mmol/L — ABNORMAL LOW (ref 3.5–5.1)
Sodium: 139 mmol/L (ref 135–145)
Total Bilirubin: 0.5 mg/dL (ref 0.0–1.2)
Total Protein: 8.8 g/dL — ABNORMAL HIGH (ref 6.5–8.1)

## 2024-03-24 LAB — PSA: Prostatic Specific Antigen: 0.35 ng/mL (ref 0.00–4.00)

## 2024-03-24 MED ORDER — ZOLEDRONIC ACID 4 MG/100ML IV SOLN
4.0000 mg | Freq: Once | INTRAVENOUS | Status: AC
  Administered 2024-03-24: 4 mg via INTRAVENOUS
  Filled 2024-03-24: qty 100

## 2024-03-24 MED ORDER — SODIUM CHLORIDE 0.9 % IV SOLN: INTRAVENOUS | Status: DC

## 2024-03-24 NOTE — Patient Instructions (Signed)

## 2024-03-24 NOTE — Congregational Nurse Program (Signed)
" °  Dept: [phone]   Congregational Nurse Program Note  Date of Encounter: 03/24/2024  Past Medical History: Past Medical History:  Diagnosis Date   Back pain    Hypertension    Prostate cancer [facility])     Encounter Details:  Community Questionnaire - 03/24/24 1120       Questionnaire   Ask client: Do you give verbal consent for me to treat you today? Yes    Student Assistance N/A    Location Patient Served  [facility]    Encounter Setting Phone/Text/Email    Population Status Migrant farmer/Refugee/Immigrant    Insurance Medicaid    Insurance/Financial Assistance Referral N/A    Medication Have Medication Insecurities    Medical Provider Yes    Medical Referrals Made [facility]    Medical Appointment Completed [facility]    Screenings CN Performed (remember to also record results) NA    CNP Interventions Navigate Healthcare System;Health Counseling;Case Management;Advocate/Support    ED Visit Averted N/A      Questionnaire   Life-Saving Intervention Made N/A         [facility] medication Xtandi . Patient now has medicaid and Xtandi , future refills will be done at [facility].  Patient informed to carry home medication to appointment today in order to figure out the timing of next refill.  Naomie Amyr Sluder RN BSN PCCN  [facility] [phone]-cell [phone]-office    "

## 2024-03-24 NOTE — Telephone Encounter (Signed)
 I have contacted patient with appointment reminder. Transportation assistance will be provided.  Naomie Neesha Langton RN BSN PCCN  [facility] [phone]-cell [phone]-office

## 2024-03-27 ENCOUNTER — Other Ambulatory Visit (HOSPITAL_COMMUNITY): Payer: Self-pay

## 2024-03-29 NOTE — Congregational Nurse Program (Signed)
" °  Dept: [phone]   Congregational Nurse Program Note  Date of Encounter: 03/29/2024  Past Medical History: Past Medical History:  Diagnosis Date   Back pain    Hypertension    Prostate cancer [facility])     Encounter Details:  Community Questionnaire - 03/29/24 1125       Questionnaire   Ask client: Do you give verbal consent for me to treat you today? Yes    Student Assistance N/A    Location Patient Served  [facility]    Encounter Setting CN site    Population Status Migrant farmer/Refugee/Immigrant    Insurance Medicaid    Insurance/Financial Assistance Referral Medicaid;N/A    Medication Have Medication Insecurities    Medical Provider Yes    Medical Referrals Made NA    Medical Appointment Completed N/A    Screenings CN Performed (remember to also record results) NA    CNP Interventions NA    ED Visit Averted N/A         Patient with Prostate CA, and COPD, came here today for support with paying bills in the context of language barrier.   Patient with housing instability. Situation discussed with Case Manager. Patient in process of application for [employer]. He will needs a meeting with mister Renee to discuss housing situation. He is scheduled to return to [facility] on 04/04/24.  Reesa Gruber, MD Pediatric resident    "

## 2024-03-30 ENCOUNTER — Other Ambulatory Visit (HOSPITAL_COMMUNITY): Payer: Self-pay

## 2024-03-30 ENCOUNTER — Other Ambulatory Visit: Payer: Self-pay | Admitting: Family Medicine

## 2024-03-30 ENCOUNTER — Other Ambulatory Visit: Payer: Self-pay

## 2024-03-30 DIAGNOSIS — G893 Neoplasm related pain (acute) (chronic): Secondary | ICD-10-CM

## 2024-03-30 MED ORDER — OXYCODONE HCL 5 MG PO TABS
5.0000 mg | ORAL_TABLET | Freq: Three times a day (TID) | ORAL | 0 refills | Status: DC | PRN
  Filled 2024-03-30: qty 60, 20d supply, fill #0

## 2024-03-31 ENCOUNTER — Other Ambulatory Visit: Payer: Self-pay

## 2024-04-05 ENCOUNTER — Other Ambulatory Visit: Payer: Self-pay

## 2024-04-05 ENCOUNTER — Other Ambulatory Visit (HOSPITAL_COMMUNITY): Payer: Self-pay

## 2024-04-05 NOTE — Progress Notes (Addendum)
 Patient counseled on telephone encounter opened on 12/22/2024.   Margareta Laureano, PharmD Hematology/Oncology Clinical Pharmacist [facility] [phone]

## 2024-04-05 NOTE — Progress Notes (Signed)
 Specialty Pharmacy Initial Fill Coordination Note  Arthur Ali is a 64 y.o. male contacted today regarding refills of specialty medication(s) Enzalutamide  (XTANDI ) .  Patient requested Delivery  on 04/07/24  to verified address [address]   [place] [address]   Medication will be filled on 04/06/24.   Patient is aware of $4 copayment.    I called to get them to get the cost exceeds max override.

## 2024-04-06 ENCOUNTER — Other Ambulatory Visit (HOSPITAL_COMMUNITY): Payer: Self-pay

## 2024-04-06 ENCOUNTER — Other Ambulatory Visit: Payer: Self-pay

## 2024-04-09 NOTE — Progress Notes (Unsigned)
" ° ° °  SUBJECTIVE:   CHIEF COMPLAINT: follow up HPI:   Arthur Ali is a 64 y.o.  with history notable for metastatic prostate cancer currently on treatment, COPD, HTN  presenting for follow   The patient speaks [place]  as their primary language.  An interpreter was used for the entire visit.  SABRA   Discussed the use of AI scribe software for clinical note transcription with the patient, who gave verbal consent to proceed.  History of Present Illness Dyspnea and chest symptoms - Cold-induced shortness of breath began during recent snowy period - Mild chest tightness in cold air - Does not have albuterol  at home  - No chest pain or pleuritic pain    Hypertension - Did not take blood pressure medication the morning of the visit  Chronic pain management - Takes oxycodone  for pain management - Controls pain, no constipation with this   Tobacco use - Smokes three cigarettes daily, Marlboro, interested in cessation   Socioeconomic factors - May be moving in with  a roommate - Denies food insecurity      PERTINENT  PMH / PSH/Family/Social History : 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 Possible COPD Refilled albuterol  and Anoro Ellipta   Schedule with Koval at next visit for spirometry   Prostate cancer metastatic to bone [facility]) Cancer related pain Has follow up with Oncology R elbow pain controlled with  Oxycodone  PDMP reviewed and refilled Essential hypertension At goal on repeat, consider ARB if persistently low  Need for shingles vaccine Rx and discussed Hypokalemia Noted on CMP from Cancer center-repeat     Suzann Daring, MD  Family Medicine Teaching Service  [facility]   "

## 2024-04-11 ENCOUNTER — Other Ambulatory Visit (HOSPITAL_COMMUNITY): Payer: Self-pay

## 2024-04-11 ENCOUNTER — Encounter: Payer: Self-pay | Admitting: Family Medicine

## 2024-04-11 ENCOUNTER — Ambulatory Visit: Admitting: Family Medicine

## 2024-04-11 ENCOUNTER — Other Ambulatory Visit: Payer: Self-pay

## 2024-04-11 VITALS — BP 127/87 | HR 97 | Ht 69.0 in | Wt 129.8 lb

## 2024-04-11 DIAGNOSIS — J449 Chronic obstructive pulmonary disease, unspecified: Secondary | ICD-10-CM | POA: Diagnosis present

## 2024-04-11 DIAGNOSIS — C7951 Secondary malignant neoplasm of bone: Secondary | ICD-10-CM | POA: Diagnosis not present

## 2024-04-11 DIAGNOSIS — E876 Hypokalemia: Secondary | ICD-10-CM | POA: Diagnosis not present

## 2024-04-11 DIAGNOSIS — Z23 Encounter for immunization: Secondary | ICD-10-CM | POA: Diagnosis not present

## 2024-04-11 DIAGNOSIS — C61 Malignant neoplasm of prostate: Secondary | ICD-10-CM

## 2024-04-11 DIAGNOSIS — G893 Neoplasm related pain (acute) (chronic): Secondary | ICD-10-CM

## 2024-04-11 DIAGNOSIS — I10 Essential (primary) hypertension: Secondary | ICD-10-CM

## 2024-04-11 MED ORDER — UMECLIDINIUM-VILANTEROL 62.5-25 MCG/ACT IN AEPB
1.0000 | INHALATION_SPRAY | Freq: Every day | RESPIRATORY_TRACT | 3 refills | Status: AC
  Filled 2024-04-11 – 2024-04-12 (×2): qty 60, 30d supply, fill #0

## 2024-04-11 MED ORDER — ALBUTEROL SULFATE HFA 108 (90 BASE) MCG/ACT IN AERS: 2.0000 | INHALATION_SPRAY | RESPIRATORY_TRACT | 3 refills | Status: AC | PRN

## 2024-04-11 MED ORDER — OXYCODONE HCL 5 MG PO TABS: 5.0000 mg | ORAL_TABLET | Freq: Three times a day (TID) | ORAL | 0 refills | Status: AC | PRN

## 2024-04-11 MED ORDER — SHINGRIX 50 MCG/0.5ML IM SUSR: INTRAMUSCULAR | 1 refills | Status: AC

## 2024-04-11 MED ORDER — SERTRALINE HCL 50 MG PO TABS
50.0000 mg | ORAL_TABLET | Freq: Every day | ORAL | 2 refills | Status: AC
  Filled 2024-04-11: qty 90, 90d supply, fill #0

## 2024-04-11 MED ORDER — NICOTINE POLACRILEX 2 MG MT LOZG
2.0000 mg | LOZENGE | OROMUCOSAL | 0 refills | Status: AC | PRN
  Filled 2024-04-11: qty 108, 28d supply, fill #0
  Filled 2024-04-12: qty 81, 30d supply, fill #0

## 2024-04-11 NOTE — Patient Instructions (Addendum)
" °  Byari byiza cyane kukubona uyu munsi.  Ngwino uzane imiti yawe YOSE kuri buri nshuro yo gusura.  Uyu munsi twaganiriye kuri ibi:  Muri farumasi yawe valrie inshinge zo gusiga imiti -- nyamuneka uzane imiti yo kuvura iyi ndwara.  Ndakugira inama yo kureka kunywa itabi Nakoherereje imiti yo gukoresha aho -- ugomba kuyinywa (ntuyinywe)  Nzagutumaho ubutumwa bwo muri laboratwari.  Ngwino ukurikirane mu mezi 2.  Komeza indi miti yawe yose.  [place] [facility].  Ngwino uhamagare kuri [phone] niba ufite ikibazo ku bijyanye na gahunda y'uyu munsi.  Ngwino ushyireho gahunda yo gukurikirana ku biro byakira abantu mbere yuko ugenda uyu munsi.  Suzann Daring, MD.  Imiti y'umuryango  It was wonderful to see you today.  Please bring ALL of your medications with you to every visit.   Today we talked about:   At your pharmacy you need a shingles shot--please take the prescription with you for this  I recommend stopping smoking I sent in  lozenges to use instead--you should suck on these (do not swallow)  I will message you with labs   Please follow up in 2 months    Continue all of your other medications   Thank you for choosing [facility].   Please call [phone] with any questions about today's appointment.  Please be sure to schedule follow up at the front  desk before you leave today.   Suzann Daring, MD  Family Medicine   "

## 2024-04-11 NOTE — Assessment & Plan Note (Signed)
 Has follow up with Oncology R elbow pain controlled with Oxycodone  PDMP reviewed and refilled

## 2024-04-11 NOTE — Assessment & Plan Note (Signed)
?   Possible COPD Refilled albuterol  and Anoro Ellipta   Schedule with Koval at next visit for spirometry

## 2024-04-12 ENCOUNTER — Other Ambulatory Visit: Payer: Self-pay

## 2024-04-12 ENCOUNTER — Ambulatory Visit: Payer: Self-pay | Admitting: Family Medicine

## 2024-04-12 LAB — BASIC METABOLIC PANEL WITH GFR
BUN/Creatinine Ratio: 15 (ref 10–24)
BUN: 13 mg/dL (ref 8–27)
CO2: 25 mmol/L (ref 20–29)
Calcium: 9.6 mg/dL (ref 8.6–10.2)
Chloride: 98 mmol/L (ref 96–106)
Creatinine, Ser: 0.86 mg/dL (ref 0.76–1.27)
Glucose: 70 mg/dL (ref 70–99)
Potassium: 3.8 mmol/L (ref 3.5–5.2)
Sodium: 139 mmol/L (ref 134–144)
eGFR: 95 mL/min/{1.73_m2}

## 2024-04-12 NOTE — Progress Notes (Signed)
 This encounter was created in error - please disregard.

## 2024-04-24 ENCOUNTER — Inpatient Hospital Stay

## 2024-05-22 ENCOUNTER — Inpatient Hospital Stay

## 2024-05-22 ENCOUNTER — Inpatient Hospital Stay: Admitting: Hematology

## 2024-06-20 ENCOUNTER — Ambulatory Visit: Admitting: Family Medicine
